# Patient Record
Sex: Male | Born: 1935 | Race: White | Hispanic: No | Marital: Married | Smoking: Former smoker
Health system: Southern US, Community
[De-identification: ages and names within clinical notes are randomized; demographics above are authoritative.]

## PROBLEM LIST (undated history)

## (undated) DIAGNOSIS — K209 Esophagitis, unspecified without bleeding: Secondary | ICD-10-CM

## (undated) DIAGNOSIS — M069 Rheumatoid arthritis, unspecified: Secondary | ICD-10-CM

## (undated) DIAGNOSIS — K5730 Diverticulosis of large intestine without perforation or abscess without bleeding: Secondary | ICD-10-CM

## (undated) DIAGNOSIS — K469 Unspecified abdominal hernia without obstruction or gangrene: Secondary | ICD-10-CM

## (undated) DIAGNOSIS — C9000 Multiple myeloma not having achieved remission: Secondary | ICD-10-CM

## (undated) DIAGNOSIS — K219 Gastro-esophageal reflux disease without esophagitis: Secondary | ICD-10-CM

## (undated) DIAGNOSIS — IMO0001 Reserved for inherently not codable concepts without codable children: Secondary | ICD-10-CM

## (undated) DIAGNOSIS — K2940 Chronic atrophic gastritis without bleeding: Secondary | ICD-10-CM

## (undated) DIAGNOSIS — Z860100 Personal history of colon polyps, unspecified: Secondary | ICD-10-CM

## (undated) DIAGNOSIS — S065X9A Traumatic subdural hemorrhage with loss of consciousness of unspecified duration, initial encounter: Secondary | ICD-10-CM

## (undated) HISTORY — DX: Gastro-esophageal reflux disease without esophagitis: K21.9

## (undated) HISTORY — DX: Personal history of colon polyps, unspecified: Z86.0100

## (undated) HISTORY — DX: Diverticulosis of large intestine without perforation or abscess without bleeding: K57.30

## (undated) HISTORY — DX: Esophagitis, unspecified: K20.9

## (undated) HISTORY — DX: Unspecified abdominal hernia without obstruction or gangrene: K46.9

## (undated) HISTORY — DX: Multiple myeloma not having achieved remission: C90.00

## (undated) HISTORY — DX: Reserved for inherently not codable concepts without codable children: IMO0001

## (undated) HISTORY — PX: ORIF HIP FRACTURE: SHX2125

## (undated) HISTORY — DX: Personal history of colonic polyps: Z86.010

## (undated) HISTORY — DX: Esophagitis, unspecified without bleeding: K20.90

## (undated) HISTORY — DX: Chronic atrophic gastritis without bleeding: K29.40

---

## 1990-03-28 HISTORY — PX: INGUINAL HERNIA REPAIR: SUR1180

## 2004-06-06 ENCOUNTER — Ambulatory Visit: Payer: Self-pay | Admitting: Gastroenterology

## 2004-06-20 ENCOUNTER — Ambulatory Visit: Payer: Self-pay | Admitting: Gastroenterology

## 2004-10-02 ENCOUNTER — Ambulatory Visit: Payer: Self-pay | Admitting: Gastroenterology

## 2008-11-20 ENCOUNTER — Encounter: Admission: RE | Admit: 2008-11-20 | Discharge: 2008-11-20 | Payer: Self-pay | Admitting: Orthopedic Surgery

## 2010-01-17 ENCOUNTER — Inpatient Hospital Stay (HOSPITAL_COMMUNITY): Admission: EM | Admit: 2010-01-17 | Discharge: 2010-01-23 | Payer: Self-pay | Admitting: Emergency Medicine

## 2010-02-21 ENCOUNTER — Encounter: Admission: RE | Admit: 2010-02-21 | Discharge: 2010-02-21 | Payer: Self-pay | Admitting: Orthopedic Surgery

## 2010-09-01 LAB — CBC
HCT: 22.3 % — ABNORMAL LOW (ref 39.0–52.0)
HCT: 33.4 % — ABNORMAL LOW (ref 39.0–52.0)
Hemoglobin: 10.8 g/dL — ABNORMAL LOW (ref 13.0–17.0)
Hemoglobin: 11 g/dL — ABNORMAL LOW (ref 13.0–17.0)
Hemoglobin: 11.3 g/dL — ABNORMAL LOW (ref 13.0–17.0)
Hemoglobin: 7.7 g/dL — ABNORMAL LOW (ref 13.0–17.0)
MCH: 31.7 pg (ref 26.0–34.0)
MCH: 32.5 pg (ref 26.0–34.0)
MCH: 32.6 pg (ref 26.0–34.0)
MCHC: 34.9 g/dL (ref 30.0–36.0)
MCV: 92.5 fL (ref 78.0–100.0)
MCV: 94.4 fL (ref 78.0–100.0)
Platelets: 143 10*3/uL — ABNORMAL LOW (ref 150–400)
RBC: 2.36 MIL/uL — ABNORMAL LOW (ref 4.22–5.81)
RBC: 3.31 MIL/uL — ABNORMAL LOW (ref 4.22–5.81)
RBC: 3.49 MIL/uL — ABNORMAL LOW (ref 4.22–5.81)
RBC: 3.51 MIL/uL — ABNORMAL LOW (ref 4.22–5.81)
RDW: 16.7 % — ABNORMAL HIGH (ref 11.5–15.5)
RDW: 18.1 % — ABNORMAL HIGH (ref 11.5–15.5)
WBC: 5.9 10*3/uL (ref 4.0–10.5)
WBC: 8.2 10*3/uL (ref 4.0–10.5)
WBC: 9.1 10*3/uL (ref 4.0–10.5)

## 2010-09-01 LAB — COMPREHENSIVE METABOLIC PANEL
ALT: 16 U/L (ref 0–53)
AST: 24 U/L (ref 0–37)
Alkaline Phosphatase: 55 U/L (ref 39–117)
Alkaline Phosphatase: 66 U/L (ref 39–117)
BUN: 21 mg/dL (ref 6–23)
Calcium: 8.8 mg/dL (ref 8.4–10.5)
Creatinine, Ser: 1.16 mg/dL (ref 0.4–1.5)
GFR calc Af Amer: >60 mL/min (ref >60)
Glucose, Bld: 201 mg/dL — ABNORMAL HIGH (ref 70–99)
Potassium: 3.9 mEq/L (ref 3.5–5.1)
Potassium: 4.8 mEq/L (ref 3.5–5.1)
Sodium: 136 mEq/L (ref 135–145)
Total Protein: 7.1 g/dL (ref 6.0–8.3)

## 2010-09-01 LAB — URINALYSIS, ROUTINE W REFLEX MICROSCOPIC
Glucose, UA: NEGATIVE mg/dL
Glucose, UA: NEGATIVE mg/dL
Ketones, ur: 15 mg/dL — AB
Leukocytes, UA: NEGATIVE
Nitrite: NEGATIVE
Specific Gravity, Urine: 1.003 — ABNORMAL LOW (ref 1.005–1.030)
Specific Gravity, Urine: 1.024 (ref 1.005–1.030)
Urobilinogen, UA: 0.2 mg/dL (ref 0.0–1.0)
pH: 6 (ref 5.0–8.0)

## 2010-09-01 LAB — DIFFERENTIAL
Basophils Absolute: 0 10*3/uL (ref 0.0–0.1)
Basophils Relative: 0 % (ref 0–1)
Blasts: 0 %
Eosinophils Absolute: 0 10*3/uL (ref 0.0–0.7)
Eosinophils Absolute: 0.1 10*3/uL (ref 0.0–0.7)
Eosinophils Relative: 0 % (ref 0–5)
Eosinophils Relative: 1 % (ref 0–5)
Lymphocytes Relative: 10 % — ABNORMAL LOW (ref 12–46)
Monocytes Relative: 2 % — ABNORMAL LOW (ref 3–12)
Monocytes Relative: 2 % — ABNORMAL LOW (ref 3–12)
Myelocytes: 0 %
Neutro Abs: 6.2 10*3/uL (ref 1.7–7.7)
Neutrophils Relative %: 88 % — ABNORMAL HIGH (ref 43–77)
nRBC: 0 /100 WBC

## 2010-09-01 LAB — TYPE AND SCREEN: Antibody Screen: NEGATIVE

## 2010-09-01 LAB — PROTIME-INR
INR: 1.24 (ref 0.00–1.49)
INR: 1.36 (ref 0.00–1.49)
Prothrombin Time: 16.6 seconds — ABNORMAL HIGH (ref 11.6–15.2)
Prothrombin Time: 17 seconds — ABNORMAL HIGH (ref 11.6–15.2)

## 2010-09-01 LAB — BASIC METABOLIC PANEL
BUN: 13 mg/dL (ref 6–23)
CO2: 25 mEq/L (ref 19–32)
CO2: 25 mEq/L (ref 19–32)
CO2: 28 mEq/L (ref 19–32)
Calcium: 7.7 mg/dL — ABNORMAL LOW (ref 8.4–10.5)
Calcium: 8.1 mg/dL — ABNORMAL LOW (ref 8.4–10.5)
Chloride: 104 mEq/L (ref 96–112)
Chloride: 105 mEq/L (ref 96–112)
Creatinine, Ser: 0.91 mg/dL (ref 0.4–1.5)
Creatinine, Ser: 0.93 mg/dL (ref 0.4–1.5)
GFR calc Af Amer: >60 mL/min (ref >60)
GFR calc non Af Amer: >60 mL/min (ref >60)
Glucose, Bld: 119 mg/dL — ABNORMAL HIGH (ref 70–99)
Potassium: 4.2 mEq/L (ref 3.5–5.1)
Potassium: 4.7 mEq/L (ref 3.5–5.1)
Sodium: 136 mEq/L (ref 135–145)
Sodium: 137 mEq/L (ref 135–145)

## 2010-09-01 LAB — URINE MICROSCOPIC-ADD ON

## 2010-09-01 LAB — URINE CULTURE
Colony Count: NO GROWTH
Culture: NO GROWTH
Special Requests: NEGATIVE

## 2011-05-23 ENCOUNTER — Encounter: Payer: Self-pay | Admitting: Gastroenterology

## 2011-05-29 ENCOUNTER — Encounter: Payer: Self-pay | Admitting: Gastroenterology

## 2011-06-26 ENCOUNTER — Ambulatory Visit (INDEPENDENT_AMBULATORY_CARE_PROVIDER_SITE_OTHER): Payer: MEDICARE | Admitting: Gastroenterology

## 2011-06-26 ENCOUNTER — Encounter: Payer: Self-pay | Admitting: Gastroenterology

## 2011-06-26 DIAGNOSIS — Z860100 Personal history of colon polyps, unspecified: Secondary | ICD-10-CM | POA: Insufficient documentation

## 2011-06-26 DIAGNOSIS — M069 Rheumatoid arthritis, unspecified: Secondary | ICD-10-CM | POA: Insufficient documentation

## 2011-06-26 DIAGNOSIS — K219 Gastro-esophageal reflux disease without esophagitis: Secondary | ICD-10-CM

## 2011-06-26 MED ORDER — PEG-KCL-NACL-NASULF-NA ASC-C 100 G PO SOLR: 1.0000 | Freq: Once | ORAL | Status: DC

## 2011-06-26 NOTE — Patient Instructions (Signed)
Your procedure has been scheduled for 07/02/2011, please follow the seperate instructions.  Your prescription(s) have been sent to you pharmacy.

## 2011-06-26 NOTE — Progress Notes (Signed)
History of Present Illness:  This is a extremely nice and pleasant 77 year old Caucasian male retiree. He has a history of a previous colon polyp removed 5 years ago, but the patient is not exactly sure where this was performed. In any case, it is recommended that he have followup exam in 5 years. He currently has no GI complaints, specifically denies melena, hematochezia, abdominal pain or history of liver or gallbladder disease. He has had chronic acid reflux for many years and has been on daily PPI therapy. He denies dysphagia or active reflux symptoms. His appetite is good and his weight is stable, and he denies any food intolerances. His sense of well being is excellent and he denies systemic complaints such as fever or chills. He does have chronic rheumatoid arthritis and is on weekly methotrexate per Dr. Cardell Peach was rheumatologist in [place], [place]. His primary care physician is Dr. Mertie Clause.   I have reviewed this patient's present history, medical and surgical past history, allergies and medications.     ROS: The remainder of the 10 point ROS is negative     Physical Exam: General well developed well nourished patient in no acute distress, appearing his stated age Eyes PERRLA, no icterus, fundoscopic exam per opthamologist Skin no lesions noted Neck supple, no adenopathy, no thyroid enlargement, no tenderness Chest clear to percussion and auscultation Heart no significant murmurs, gallops or rubs noted Abdomen no hepatosplenomegaly masses or tenderness, BS normal.  Extremities no acute joint lesions, edema, phlebitis or evidence of cellulitis. Neurologic patient oriented x 3, cranial nerves intact, no focal neurologic deficits noted. Psychological mental status normal and normal affect.  Assessment and plan: This patient has chronic GERD and is on daily PPI therapy. It is unclear if he ever has had endoscopic exam to screen for Barrett's mucosa. I have scheduled this  exam at the time of his followup colonoscopy per his history of colon polyps. His family history is noncontributory. Patient's rheumatoid arthritis seems to be doing well on methotrexate therapy. He has no history of any cardiovascular or other systemic illnesses. Blood pressure today is 122/72, pulse 68, and BMI is 21.5.

## 2011-07-02 ENCOUNTER — Other Ambulatory Visit: Payer: Self-pay | Admitting: Gastroenterology

## 2011-07-02 ENCOUNTER — Encounter: Payer: Self-pay | Admitting: Gastroenterology

## 2011-07-02 ENCOUNTER — Ambulatory Visit (AMBULATORY_SURGERY_CENTER): Payer: MEDICARE | Admitting: Gastroenterology

## 2011-07-02 VITALS — HR 80 | Temp 96.7°F | Resp 20 | Ht 70.0 in | Wt 149.0 lb

## 2011-07-02 DIAGNOSIS — K449 Diaphragmatic hernia without obstruction or gangrene: Secondary | ICD-10-CM | POA: Insufficient documentation

## 2011-07-02 DIAGNOSIS — B3781 Candidal esophagitis: Secondary | ICD-10-CM

## 2011-07-02 DIAGNOSIS — K219 Gastro-esophageal reflux disease without esophagitis: Secondary | ICD-10-CM

## 2011-07-02 DIAGNOSIS — K209 Esophagitis, unspecified: Secondary | ICD-10-CM

## 2011-07-02 DIAGNOSIS — Z1211 Encounter for screening for malignant neoplasm of colon: Secondary | ICD-10-CM

## 2011-07-02 DIAGNOSIS — Z86010 Personal history of colonic polyps: Secondary | ICD-10-CM

## 2011-07-02 DIAGNOSIS — K5710 Diverticulosis of small intestine without perforation or abscess without bleeding: Secondary | ICD-10-CM | POA: Insufficient documentation

## 2011-07-02 MED ORDER — FLUCONAZOLE 100 MG PO TABS: ORAL_TABLET | ORAL | Status: DC

## 2011-07-02 MED ORDER — SODIUM CHLORIDE 0.9 % IV SOLN: 500.0000 mL | INTRAVENOUS | Status: DC

## 2011-07-02 NOTE — Op Note (Addendum)
[facility] [address] [place], [place]  [address]  ENDOSCOPY PROCEDURE REPORT  PATIENT:  Shawn Chen, Shawn Chen  MR#:  [id number] BIRTHDATE:  1926-09-26, 84 yrs. old  GENDER:  male  ENDOSCOPIST:  Vania Rea. Jarold Motto, MD, [facility] Referred by:  PROCEDURE DATE:  07/02/2011 PROCEDURE:  EGD with biopsy, 95621 ASA CLASS:  Class II INDICATIONS:  GERD  MEDICATIONS:   There was residual sedation effect present from prior procedure., propofol (Diprivan) 80 mg IV TOPICAL ANESTHETIC:  DESCRIPTION OF PROCEDURE:   After the risks and benefits of the procedure were explained, informed consent was obtained.  The [facility] [id number] endoscope was introduced through the mouth and advanced to the second portion of the duodenum.  The instrument was slowly withdrawn as the mucosa was fully examined. <<PROCEDUREIMAGES>>  A diverticulum was found in the bulb and descending duodenum.  A hiatal hernia was found. LARGE HH NOTED.SEE PICTURES.  white plaques in the mid esophagus. DENSE CANDIDA PLAQUES.  Esophagitis was found. LONG LINEAR EROSIONS.DISTAL BIOPSIES DONE. Retroflexed views revealed a hiatal hernia.    The scope was then withdrawn from the patient and the procedure completed.  COMPLICATIONS:  None  ENDOSCOPIC IMPRESSION: 1) Diverticulum in the bulb/descending duodenum 2) Hiatal hernia 3) White plaques in the mid esophagus 4) Esophagitis 1.GERD.R/O BARRETT'S MUCOSA 2.CANDIDA ESOPHAGITIS 3.DUODENAL DIVERTICULUM RECOMMENDATIONS: 1.DIFLUCAN RX. 2.CONTINUE PPI 3.AWAIT BIOPSIES  ______________________________ Vania Rea. Jarold Motto, MD, [facility]  CC:  [name], MD  n. Rosalie Doctor:   Vania Rea. Gideon Burstein at 07/02/2011 10:16 AM  Joslyn Devon, [phone]

## 2011-07-02 NOTE — Patient Instructions (Addendum)
COLON-  MILD DIVERTICULOSIS  ENDO-  HIATAL HERNIA, ESOPHAGITIS, CANDIDA ESOPHAGUS  DIFLUCAN PRESCRIPTION  SENT TO PHARMACY  ACIPHEX SAMPLES GIVEN

## 2011-07-02 NOTE — Op Note (Signed)
[facility] [address] [place], [place]  [address]  COLONOSCOPY PROCEDURE REPORT  PATIENT:  Shawn Chen, Shawn Chen  MR#:  [id number] BIRTHDATE:  1927/01/09, 84 yrs. old  GENDER:  male ENDOSCOPIST:  Vania Rea. Jarold Motto, MD, [facility] REF. BY: PROCEDURE DATE:  07/02/2011 PROCEDURE:  Diagnostic Colonoscopy ASA CLASS:  Class II INDICATIONS:  history of polyps MEDICATIONS:   propofol (Diprivan) 100 mg IV  DESCRIPTION OF PROCEDURE:   After the risks and benefits and of the procedure were explained, informed consent was obtained. Digital rectal exam was performed and revealed no abnormalities. The [id number] endoscope was introduced through the anus and advanced to the cecum, which was identified by both the appendix and ileocecal valve.  The quality of the prep was excellent, using MoviPrep.  The instrument was then slowly withdrawn as the colon was fully examined. <<PROCEDUREIMAGES>>  FINDINGS:  No polyps or cancers were seen.  There were mild diverticular changes in left colon. diverticulosis was found. This was otherwise a normal examination of the colon.   Retroflexed views in the rectum revealed no abnormalities.    The scope was then withdrawn from the patient and the procedure completed.  COMPLICATIONS:  None ENDOSCOPIC IMPRESSION: 1) No polyps or cancers 2) Diverticulosis,mild,left sided diverticulosis 3) Otherwise normal examination RECOMMENDATIONS: 1) High fiber diet. PRN COLONOSCOPY F/U  REPEAT EXAM:  No  ______________________________ Vania Rea. Jarold Motto, MD, [facility]  CC:  [name], MD  n. Rosalie Doctor:   Vania Rea. Patterson at 07/02/2011 10:06 AM  Joslyn Devon, [phone]

## 2011-07-02 NOTE — Progress Notes (Signed)
Patient did not experience any of the following events: a burn prior to discharge; a fall within the facility; wrong site/side/patient/procedure/implant event; or a hospital transfer or hospital admission upon discharge from the facility. (G8907) Patient did not have preoperative order for IV antibiotic SSI prophylaxis. (G8918)  

## 2011-07-03 ENCOUNTER — Telehealth: Payer: Self-pay | Admitting: *Deleted

## 2011-07-03 NOTE — Telephone Encounter (Signed)

## 2011-07-19 ENCOUNTER — Encounter: Payer: Self-pay | Admitting: Internal Medicine

## 2011-07-25 ENCOUNTER — Encounter: Payer: Self-pay | Admitting: Gastroenterology

## 2011-07-25 ENCOUNTER — Ambulatory Visit (INDEPENDENT_AMBULATORY_CARE_PROVIDER_SITE_OTHER): Payer: Medicare Other | Admitting: Gastroenterology

## 2011-07-25 VITALS — BP 100/52 | HR 68 | Ht 70.0 in | Wt 151.1 lb

## 2011-07-25 DIAGNOSIS — K219 Gastro-esophageal reflux disease without esophagitis: Secondary | ICD-10-CM | POA: Insufficient documentation

## 2011-07-25 DIAGNOSIS — K5730 Diverticulosis of large intestine without perforation or abscess without bleeding: Secondary | ICD-10-CM

## 2011-07-25 DIAGNOSIS — Z86010 Personal history of colonic polyps: Secondary | ICD-10-CM

## 2011-07-25 MED ORDER — PANTOPRAZOLE SODIUM 20 MG PO TBEC: 20.0000 mg | DELAYED_RELEASE_TABLET | ORAL | Status: DC | PRN

## 2011-07-25 NOTE — Progress Notes (Signed)
History of Present Illness: This is a very pleasant 78 year old Caucasian male who recently had colonoscopy of which was unremarkable except for diverticulosis. He has a history of previous colon polyps. Endoscopy was performed because of chronic GERD, and there was no evidence of Barrett's mucosa on esophageal biopsies, but he did have evidence of Candida esophagitis that was treated with Diflucan therapy. He denies chest pain or dysphagia at this time. Colonoscopy did reveal left colon diverticulosis. He denies bowel or regularity, abdominal pain, or general medical issues at this time.    Current Medications, Allergies, Past Medical History, Past Surgical History, Family History and Social History were reviewed in [employer] record.   Assessment and plan: Continue high fiber diet as tolerated, reflux regime, and daily PPI therapy. We will see him on a when necessary basis as needed. He is to continue his other medications as per primary care as outlined and reviewed. No diagnosis found.

## 2011-07-25 NOTE — Patient Instructions (Signed)
Your Protonix has been sent to your pharmacy. cc: Buren Kos, MD

## 2011-11-05 ENCOUNTER — Other Ambulatory Visit: Payer: Self-pay | Admitting: Internal Medicine

## 2011-11-05 DIAGNOSIS — I714 Abdominal aortic aneurysm, without rupture: Secondary | ICD-10-CM

## 2011-11-09 ENCOUNTER — Ambulatory Visit
Admission: RE | Admit: 2011-11-09 | Discharge: 2011-11-09 | Disposition: A | Discharge status: Home / Self Care | Payer: Medicare Other | Source: Ambulatory Visit | Attending: Internal Medicine | Admitting: Internal Medicine

## 2011-11-09 DIAGNOSIS — I714 Abdominal aortic aneurysm, without rupture: Secondary | ICD-10-CM

## 2012-07-28 ENCOUNTER — Other Ambulatory Visit: Payer: Self-pay | Admitting: *Deleted

## 2012-07-28 MED ORDER — PANTOPRAZOLE SODIUM 20 MG PO TBEC: 20.0000 mg | DELAYED_RELEASE_TABLET | ORAL | Status: DC | PRN

## 2012-11-16 DIAGNOSIS — S065XAA Traumatic subdural hemorrhage with loss of consciousness status unknown, initial encounter: Secondary | ICD-10-CM

## 2012-11-16 DIAGNOSIS — S065X9A Traumatic subdural hemorrhage with loss of consciousness of unspecified duration, initial encounter: Secondary | ICD-10-CM

## 2012-11-16 HISTORY — DX: Traumatic subdural hemorrhage with loss of consciousness of unspecified duration, initial encounter: S06.5X9A

## 2012-11-16 HISTORY — DX: Traumatic subdural hemorrhage with loss of consciousness status unknown, initial encounter: S06.5XAA

## 2012-11-22 ENCOUNTER — Emergency Department (HOSPITAL_BASED_OUTPATIENT_CLINIC_OR_DEPARTMENT_OTHER)
Admission: EM | Admit: 2012-11-22 | Discharge: 2012-11-22 | Disposition: A | Discharge status: Home / Self Care | Payer: Medicare Other | Attending: Emergency Medicine | Admitting: Emergency Medicine

## 2012-11-22 ENCOUNTER — Emergency Department (HOSPITAL_BASED_OUTPATIENT_CLINIC_OR_DEPARTMENT_OTHER): Payer: Medicare Other

## 2012-11-22 ENCOUNTER — Encounter (HOSPITAL_BASED_OUTPATIENT_CLINIC_OR_DEPARTMENT_OTHER): Payer: Self-pay

## 2012-11-22 DIAGNOSIS — S0993XA Unspecified injury of face, initial encounter: Secondary | ICD-10-CM | POA: Insufficient documentation

## 2012-11-22 DIAGNOSIS — S0001XA Abrasion of scalp, initial encounter: Secondary | ICD-10-CM

## 2012-11-22 DIAGNOSIS — S0990XA Unspecified injury of head, initial encounter: Secondary | ICD-10-CM

## 2012-11-22 DIAGNOSIS — Z8739 Personal history of other diseases of the musculoskeletal system and connective tissue: Secondary | ICD-10-CM | POA: Insufficient documentation

## 2012-11-22 DIAGNOSIS — Z87891 Personal history of nicotine dependence: Secondary | ICD-10-CM | POA: Insufficient documentation

## 2012-11-22 DIAGNOSIS — Z8719 Personal history of other diseases of the digestive system: Secondary | ICD-10-CM | POA: Insufficient documentation

## 2012-11-22 DIAGNOSIS — Z860100 Personal history of colon polyps, unspecified: Secondary | ICD-10-CM | POA: Insufficient documentation

## 2012-11-22 DIAGNOSIS — W19XXXA Unspecified fall, initial encounter: Secondary | ICD-10-CM

## 2012-11-22 DIAGNOSIS — Z872 Personal history of diseases of the skin and subcutaneous tissue: Secondary | ICD-10-CM | POA: Insufficient documentation

## 2012-11-22 DIAGNOSIS — Y939 Activity, unspecified: Secondary | ICD-10-CM | POA: Insufficient documentation

## 2012-11-22 DIAGNOSIS — IMO0002 Reserved for concepts with insufficient information to code with codable children: Secondary | ICD-10-CM | POA: Insufficient documentation

## 2012-11-22 DIAGNOSIS — F29 Unspecified psychosis not due to a substance or known physiological condition: Secondary | ICD-10-CM | POA: Insufficient documentation

## 2012-11-22 DIAGNOSIS — Z79899 Other long term (current) drug therapy: Secondary | ICD-10-CM | POA: Insufficient documentation

## 2012-11-22 DIAGNOSIS — Y929 Unspecified place or not applicable: Secondary | ICD-10-CM | POA: Insufficient documentation

## 2012-11-22 DIAGNOSIS — W1809XA Striking against other object with subsequent fall, initial encounter: Secondary | ICD-10-CM | POA: Insufficient documentation

## 2012-11-22 DIAGNOSIS — K219 Gastro-esophageal reflux disease without esophagitis: Secondary | ICD-10-CM | POA: Insufficient documentation

## 2012-11-22 HISTORY — DX: Multiple myeloma not having achieved remission: C90.00

## 2012-11-22 HISTORY — DX: Rheumatoid arthritis, unspecified: M06.9

## 2012-11-22 NOTE — ED Notes (Signed)
Pt's wife states that the patient fell yesterday, falling backwards hitting his head on concrete.  EMS came to scene, told pt he didn't need to go to hospital.  Pt stated to wife this morning that he woke up, and head felt "fuzzy".  PERRLA, 3mm.  Grips strong equal bilaterally, hematoma to posterior upper head.

## 2012-11-22 NOTE — ED Provider Notes (Signed)
History     CSN: [id number]  Arrival date & time 11/22/12  1103   First MD Initiated Contact with Patient 11/22/12 1121      Chief Complaint  Patient presents with  . Fall  . Head Injury    (Consider location/radiation/quality/duration/timing/severity/associated sxs/prior treatment) Patient is a 79 y.o. male presenting with fall and head injury. The history is provided by the patient.  Fall Pertinent negatives include no chest pain, no abdominal pain and no shortness of breath.  Head Injury Associated symptoms: neck pain   Associated symptoms: no nausea and no vomiting    patient with fall yesterday fell and hit the back of his head. No loss of consciousness. But today 2 I seems to be a little confused and fuzzy. Patient did receive an abrasion to the back of his head no significant bleeding. No nausea no vomiting no other injuries. Did not hurt his hips or legs or arms. Not complaining of back pain. Patient not complaining of any pain currently other than some mild neck pain which he says about a 2/10.  Past Medical History  Diagnosis Date  . Diverticulosis of colon (without mention of hemorrhage)   . Esophagitis, unspecified   . 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 Personal history of colonic polyps   . Reflux   . Hernia   . RA (rheumatoid arthritis)   . Myeloma     possiblity of this after last physical, to follow up with hematology    Past Surgical History  Procedure Laterality Date  . Inguinal hernia repair  03/28/1990    dr price  . Orif hip fracture      Family History  Problem Relation Age of Onset  . Colon cancer Neg Hx     History  Substance Use Topics  . Smoking status: Former Smoker    Quit date: 06/18/1990  . Smokeless tobacco: Never Used  . Alcohol Use: No      Review of Systems  Constitutional: Negative for fever.  HENT: Positive for neck pain.   Respiratory: Negative for shortness of breath.   Cardiovascular: Negative for  chest pain.  Gastrointestinal: Negative for nausea, vomiting and abdominal pain.  Musculoskeletal: Negative for back pain.  Skin: Negative for rash.    Allergies  Review of patient's allergies indicates no known allergies.  Home Medications   Current Outpatient Rx  Name  Route  Sig  Dispense  Refill  . fluconazole (DIFLUCAN) 100 MG tablet      Take two tablets by mouth on day one and one tablet by mouth for two weeks.   15 tablet   0   . methotrexate 2.5 MG tablet   Oral   Take 2.5 mg by mouth once a week.           . metoprolol (TOPROL-XL) 50 MG 24 hr tablet   Oral   Take 50 mg by mouth daily. Take 1/2 of a tablet by mouth every day          . pantoprazole (PROTONIX) 20 MG tablet   Oral   Take 1 tablet (20 mg total) by mouth as needed.   30 tablet   9   . simvastatin (ZOCOR) 40 MG tablet   Oral   Take 40 mg by mouth daily.             BP 112/64  Pulse 68  Temp(Src) 98 F (36.7 C) (Oral)  Resp 18  Ht 5'  4" (1.626 m)  Wt 140 lb (63.504 kg)  BMI 24.02 kg/m2  SpO2 100%  Physical Exam  Constitutional: He is oriented to person, place, and time. He appears well-developed and well-nourished. No distress.  HENT:  Mouth/Throat: Oropharynx is clear and moist.  An abrasion to the back of the head measuring about 3 cm. No active bleeding no signs of infection. No step off.  Eyes: Conjunctivae and EOM are normal. Pupils are equal, round, and reactive to light.  Neck: Normal range of motion. Neck supple.  Cardiovascular: Normal rate, regular rhythm and normal heart sounds.   No murmur heard. Pulmonary/Chest: Effort normal and breath sounds normal.  Abdominal: Soft. Bowel sounds are normal. There is no tenderness.  Musculoskeletal: Normal range of motion.  Neurological: He is alert and oriented to person, place, and time. No cranial nerve deficit. He exhibits normal muscle tone. Coordination normal.  Skin: Skin is warm. No rash noted.    ED Course  Procedures  (including critical care time)  Labs Reviewed - No data to display Ct Head Wo Contrast  11/22/2012   *RADIOLOGY REPORT*  Clinical Data:  History of fall with injury to the head.  Head and neck pain.  CT HEAD WITHOUT CONTRAST CT CERVICAL SPINE WITHOUT CONTRAST  Technique:  Multidetector CT imaging of the head and cervical spine was performed following the standard protocol without intravenous contrast.  Multiplanar CT image reconstructions of the cervical spine were also generated.  Comparison:  No priors.  CT HEAD  Findings: Mild cerebral and cerebellar atrophy.  Extensive patchy and confluent areas of decreased attenuation throughout the deep and periventricular white matter of the cerebral hemispheres bilaterally, compatible with advanced chronic microvascular ischemic disease.  Physiologic calcifications in the basal ganglia bilaterally. No acute displaced skull fractures are identified.  No acute intracranial abnormality.  Specifically, no evidence of acute post-traumatic intracranial hemorrhage, no definite regions of acute/subacute cerebral ischemia, no focal mass, mass effect, hydrocephalus or abnormal intra or extra-axial fluid collections. The visualized paranasal sinuses are generally well pneumatized, with exception of some mild multifocal echoes with thickening throughout the ethmoid and maxillary sinuses bilaterally. Bilateral mastoid effusions with some sclerosis of several mastoid air cells.  IMPRESSION: 1.  No acute displaced skull fractures or acute intracranial abnormalities. 2.  Small bilateral mastoid effusions.  Whether or not these are acute or chronic is uncertain ( although, given the sclerosis in several mastoid cells these are favored to be chronic). 3.  Cerebral and cerebellar atrophy with extensive chronic microvascular ischemic disease throughout the deep and periventricular white matter of the cerebral hemispheres bilaterally.  CT CERVICAL SPINE  Findings: No acute displaced  fractures of the cervical spine. Exaggeration of the normal cervical lordosis, which is favored to be either positional or degenerative.  There is multilevel degenerative disc disease, most severe at C4-C5, C5-C6 and C6-C7. Mild multilevel facet arthropathy is noted. 2 mm of retrolisthesis of C5 upon C6.  Prevertebral soft tissues are normal.  Visualized portions of the upper thorax are unremarkable.  IMPRESSION:  1.  No evidence of significant acute traumatic injury to the cervical spine. 2.  Multilevel degenerative disc disease and cervical spondylosis, as above.   Original Report Authenticated By: Trudie Reed, M.D.   Ct Cervical Spine Wo Contrast  11/22/2012   *RADIOLOGY REPORT*  Clinical Data:  History of fall with injury to the head.  Head and neck pain.  CT HEAD WITHOUT CONTRAST CT CERVICAL SPINE WITHOUT CONTRAST  Technique:  Multidetector  CT imaging of the head and cervical spine was performed following the standard protocol without intravenous contrast.  Multiplanar CT image reconstructions of the cervical spine were also generated.  Comparison:  No priors.  CT HEAD  Findings: Mild cerebral and cerebellar atrophy.  Extensive patchy and confluent areas of decreased attenuation throughout the deep and periventricular white matter of the cerebral hemispheres bilaterally, compatible with advanced chronic microvascular ischemic disease.  Physiologic calcifications in the basal ganglia bilaterally. No acute displaced skull fractures are identified.  No acute intracranial abnormality.  Specifically, no evidence of acute post-traumatic intracranial hemorrhage, no definite regions of acute/subacute cerebral ischemia, no focal mass, mass effect, hydrocephalus or abnormal intra or extra-axial fluid collections. The visualized paranasal sinuses are generally well pneumatized, with exception of some mild multifocal echoes with thickening throughout the ethmoid and maxillary sinuses bilaterally. Bilateral mastoid  effusions with some sclerosis of several mastoid air cells.  IMPRESSION: 1.  No acute displaced skull fractures or acute intracranial abnormalities. 2.  Small bilateral mastoid effusions.  Whether or not these are acute or chronic is uncertain ( although, given the sclerosis in several mastoid cells these are favored to be chronic). 3.  Cerebral and cerebellar atrophy with extensive chronic microvascular ischemic disease throughout the deep and periventricular white matter of the cerebral hemispheres bilaterally.  CT CERVICAL SPINE  Findings: No acute displaced fractures of the cervical spine. Exaggeration of the normal cervical lordosis, which is favored to be either positional or degenerative.  There is multilevel degenerative disc disease, most severe at C4-C5, C5-C6 and C6-C7. Mild multilevel facet arthropathy is noted. 2 mm of retrolisthesis of C5 upon C6.  Prevertebral soft tissues are normal.  Visualized portions of the upper thorax are unremarkable.  IMPRESSION:  1.  No evidence of significant acute traumatic injury to the cervical spine. 2.  Multilevel degenerative disc disease and cervical spondylosis, as above.   Original Report Authenticated By: Trudie Reed, M.D.     1. Fall, initial encounter   2. Head injury, initial encounter   3. Scalp abrasion, initial encounter       MDM  Status post fall yesterday fell backwards striking the back of his head. Has an abrasion there measuring about 3 cm no signs of infection. CT of head and neck without any acute injury or abnormalities.        Shelda Jakes, MD 11/22/12 [phone]

## 2012-11-24 ENCOUNTER — Telehealth: Payer: Self-pay | Admitting: Hematology & Oncology

## 2012-11-24 ENCOUNTER — Ambulatory Visit
Admission: RE | Admit: 2012-11-24 | Discharge: 2012-11-24 | Disposition: A | Discharge status: Home / Self Care | Payer: Medicare Other | Source: Ambulatory Visit | Attending: Internal Medicine | Admitting: Internal Medicine

## 2012-11-24 ENCOUNTER — Other Ambulatory Visit: Payer: Self-pay | Admitting: Internal Medicine

## 2012-11-24 DIAGNOSIS — D472 Monoclonal gammopathy: Secondary | ICD-10-CM

## 2012-11-24 DIAGNOSIS — D649 Anemia, unspecified: Secondary | ICD-10-CM

## 2012-11-24 NOTE — Telephone Encounter (Signed)
Pt aware of 7-9 appointment °

## 2012-11-26 ENCOUNTER — Telehealth: Payer: Self-pay | Admitting: Hematology & Oncology

## 2012-11-26 NOTE — Telephone Encounter (Signed)
Daughter called  Wanted to change 7-9 to 7-11 I called pt and he said that was fine. He is aware of new 7-11 appointment and time.

## 2012-11-27 ENCOUNTER — Telehealth: Payer: Self-pay | Admitting: Hematology & Oncology

## 2012-11-27 NOTE — Telephone Encounter (Signed)
Pt aware moved 7-11 to 7-1

## 2012-12-16 ENCOUNTER — Ambulatory Visit: Payer: Medicare Other

## 2012-12-16 ENCOUNTER — Ambulatory Visit (HOSPITAL_BASED_OUTPATIENT_CLINIC_OR_DEPARTMENT_OTHER): Payer: Medicare Other | Admitting: Hematology & Oncology

## 2012-12-16 ENCOUNTER — Ambulatory Visit (HOSPITAL_BASED_OUTPATIENT_CLINIC_OR_DEPARTMENT_OTHER): Payer: Medicare Other | Admitting: Lab

## 2012-12-16 ENCOUNTER — Telehealth: Payer: Self-pay | Admitting: Hematology & Oncology

## 2012-12-16 VITALS — BP 98/48 | HR 59 | Temp 97.4°F | Resp 18 | Ht 64.0 in | Wt 144.0 lb

## 2012-12-16 DIAGNOSIS — D472 Monoclonal gammopathy: Secondary | ICD-10-CM

## 2012-12-16 LAB — CBC WITH DIFFERENTIAL (CANCER CENTER ONLY)
BASO#: 0 10*3/uL (ref 0.0–0.2)
BASO%: 0.5 % (ref 0.0–2.0)
EOS%: 1.3 % (ref 0.0–7.0)
HCT: 29.5 % — ABNORMAL LOW (ref 38.7–49.9)
HGB: 10 g/dL — ABNORMAL LOW (ref 13.0–17.1)
LYMPH%: 23.7 % (ref 14.0–48.0)
MCHC: 33.9 g/dL (ref 32.0–35.9)
MCV: 99 fL — ABNORMAL HIGH (ref 82–98)
NEUT%: 70.6 % (ref 40.0–80.0)
RDW: 15.8 % — ABNORMAL HIGH (ref 11.1–15.7)

## 2012-12-16 NOTE — Telephone Encounter (Signed)
Patient was called and given 12/23/12 apt date/time and instructions.

## 2012-12-16 NOTE — Progress Notes (Signed)
This office note has been dictated.

## 2012-12-17 NOTE — Progress Notes (Signed)
CC:   [name], M.D.  DIAGNOSIS: 1. Monoclonal gammopathy, IgG lambda. 2. Anemia/leukopenia.  HISTORY PRESENT ILLNESS:  Mr. Shawn Chen is a very nice 79 year old white gentleman.  He is originally from [place].  He did serve our country.  He was in the [place] War conflict but was never stationed overseas.  He was in the [employer].  He is followed by Dr. [name].  He has multiple medical issues.  He has been longstanding rheumatoid arthritis.  He stated he take methotrexate every weekend.  He has been followed with routine lab studies.  He apparently had some lab work done recently.  I think this must have shown some anemia. Unfortunately, I do not have any CBCs in his record.  He then had a serum protein electrophoresis done.  Not surprisingly, this showed a monoclonal spike.  The M spike was 2 g/dL.  Immunofixation showed this to be an IgG lambda spike.  He had a random urine test done.  The urine electrophoresis also showed an IgG lambda spike.  He had a B12 level.  This was normal.  ferritin was okay.  I do not have any electrolytes on him.  His PSA was 1.35.  TSH was 1.86.  He did have a bone survey done.  The bone survey was done on 11/24/2012. The bone survey did not show any evidence of myelomatous bones.  He had a CT of the head and neck done.  This was because of a fall. This again did not show any obvious myelomatous lesions.  He had degenerative changes in his spine.  He had some chronic changes in his brain.  Because of the monoclonal spike, it was felt that a hematologic evaluation was indicated.  As such, he was sent to the [facility] for an evaluation.  He is doing okay.  The rheumatoid arthritis seems to be his worst problem.  He does have a hard time getting around.  He has had no problem with the pneumonias.  He has had no obvious weight loss.  He has had a decent appetite.  There has been no change in bowel or bladder habits.  He  has had no cough.  PAST MEDICAL HISTORY:  Remarkable for: 1. Rheumatoid arthritis. 2. Hypertension. 3. Hyperlipidemia. 4. Osteoporosis. 5. Depression. 6. History of pulmonary nodule.  ALLERGIES:  None.  MEDICATIONS:  Iron sulfate 1 p.o. daily, methotrexate 15 mg p.o. weekly, Toprol-XL __________ mg p.o. daily, Protonix 20 mg p.o. daily, Zocor 40 mg p.o. daily.  SOCIAL HISTORY:  Remarkable for some tobacco use.  He has probably not smoked for 40 years.  He may have had a 10 pack year history of tobacco use.  There is no significant alcohol use.  He has no obvious occupational exposures.  FAMILY HISTORY:  Noncontributory.  REVIEW OF SYSTEMS:  As stated in history of present illness.  No additional findings are noted on 12-system review.  PHYSICAL EXAMINATION:  General:  This is an elderly white gentleman in no obvious distress.  Vital signs:  Temperature of 97.4, pulse 59, respiratory rate 18, blood pressure 98/48.  Weight is 144.  Head and neck:  Normocephalic, atraumatic skull.  There are no ocular or oral lesions.  There are no palpable cervical or supraclavicular lymph nodes. Lungs:  Clear to percussion and auscultation bilaterally.  Cardiac: Regular rate and rhythm with a normal S1 and S2.  There are no murmurs, rubs or bruits.  Abdomen:  Soft with good bowel  sounds.  There is no palpable abdominal mass.  There is no fluid wave.  There is no palpable hepatosplenomegaly.  Extremities show arthritic changes in his joints. He has decent strength in his arms and legs.  He has decent pulses in his distal extremities.  Neurologic:  Shows no focal neurological deficits.  LABORATORY STUDIES:  White cell count is 3.9, hemoglobin 10, hematocrit 39.5, platelet count 171.  BUN is 27, creatinine 1.21.  Total protein 7.3 with an albumin of 3.6. Calcium is 8.8.  IMPRESSION:  Mr. Dozal is a very nice 79 year old gentleman with a monoclonal spike.  This is an IgG lambda  spike.  I would have to believe that this is going to be a monoclonal gammopathy of undetermined significance.  There are no bone lesions.  He has no hypercalcemia.  He is anemic with some mild leukopenia, but I suspect that this is probably from his methotrexate.  I did look at his blood smear.  I did not see any plasma cells.  There was no rouleaux formation.  He had no nucleated red cells.  White cells were not hypersegmented.  There were no immature myeloid cells. Platelets were adequate in number and size.  Unfortunately, I think we are going to have to do a bone marrow biopsy on him.  Because of his history of rheumatoid arthritis, he is at risk for myeloma.  He is also at risk for a lymphoproliferative disorder given that he has been on methotrexate.  I do not see that we need to do any scans on him.  I think a bone marrow biopsy would be appropriate.  I spoke to him and his family.  They are all very, very nice.  We will set up the bone marrow biopsy for July 8th.  I think the bone marrow biopsy will really dictate how we need to follow up Mr. Kisling.  If we just find that this is an MGUS, we could probably just follow him along every 4-6 months.  Again, I suspect that the anemia and thrombocytopenia are going to be because of the methotrexate.  I would not stop the methotrexate, however, as he seems to be doing fairly well with this.  I spent a good hour or so with Mr. Lando and his family.  Again, they are all very, very nice.  I will plan for followup depending on what we find with our bone marrow biopsy.    ______________________________ Josph Macho, M.D. PRE/MEDQ  D:  12/16/2012  T:  12/17/2012  Job:  [id number]

## 2012-12-18 LAB — KAPPA/LAMBDA LIGHT CHAINS
Kappa free light chain: 2.1 mg/dL — ABNORMAL HIGH (ref 0.33–1.94)
Lambda Free Lght Chn: 14.4 mg/dL — ABNORMAL HIGH (ref 0.57–2.63)

## 2012-12-18 LAB — COMPREHENSIVE METABOLIC PANEL
ALT: 11 U/L (ref 0–53)
AST: 14 U/L (ref 0–37)
BUN: 27 mg/dL — ABNORMAL HIGH (ref 6–23)
CO2: 24 mEq/L (ref 19–32)
Calcium: 8.8 mg/dL (ref 8.4–10.5)
Creatinine, Ser: 1.21 mg/dL (ref 0.50–1.35)
Total Bilirubin: 0.8 mg/dL (ref 0.3–1.2)

## 2012-12-18 LAB — PROTEIN ELECTROPHORESIS, SERUM, WITH REFLEX
Albumin ELP: 46.4 % — ABNORMAL LOW (ref 55.8–66.1)
Beta 2: 27.9 % — ABNORMAL HIGH (ref 3.2–6.5)
Beta Globulin: 5.1 % (ref 4.7–7.2)
M-Spike, %: 1.89 g/dL

## 2012-12-18 LAB — IFE INTERPRETATION

## 2012-12-18 LAB — IGG, IGA, IGM
IgG (Immunoglobin G), Serum: 2310 mg/dL — ABNORMAL HIGH (ref 650–1600)
IgM, Serum: 134 mg/dL (ref 41–251)

## 2012-12-23 ENCOUNTER — Other Ambulatory Visit (HOSPITAL_BASED_OUTPATIENT_CLINIC_OR_DEPARTMENT_OTHER): Payer: Medicare Other | Admitting: Lab

## 2012-12-23 ENCOUNTER — Other Ambulatory Visit (HOSPITAL_COMMUNITY)
Admission: RE | Admit: 2012-12-23 | Discharge: 2012-12-23 | Disposition: A | Discharge status: Home / Self Care | Payer: Medicare Other | Source: Ambulatory Visit | Attending: Hematology & Oncology | Admitting: Hematology & Oncology

## 2012-12-23 ENCOUNTER — Ambulatory Visit (HOSPITAL_BASED_OUTPATIENT_CLINIC_OR_DEPARTMENT_OTHER): Payer: Medicare Other | Admitting: Hematology & Oncology

## 2012-12-23 VITALS — BP 111/57 | HR 57 | Temp 97.4°F | Resp 18 | Ht 64.0 in | Wt 145.0 lb

## 2012-12-23 DIAGNOSIS — D539 Nutritional anemia, unspecified: Secondary | ICD-10-CM | POA: Insufficient documentation

## 2012-12-23 DIAGNOSIS — D472 Monoclonal gammopathy: Secondary | ICD-10-CM

## 2012-12-23 DIAGNOSIS — D649 Anemia, unspecified: Secondary | ICD-10-CM

## 2012-12-23 LAB — CBC WITH DIFFERENTIAL (CANCER CENTER ONLY)
BASO#: 0 10*3/uL (ref 0.0–0.2)
BASO%: 0.2 % (ref 0.0–2.0)
EOS%: 2.1 % (ref 0.0–7.0)
HCT: 30.1 % — ABNORMAL LOW (ref 38.7–49.9)
HGB: 10.1 g/dL — ABNORMAL LOW (ref 13.0–17.1)
LYMPH#: 1.4 10*3/uL (ref 0.9–3.3)
LYMPH%: 33.3 % (ref 14.0–48.0)
MCH: 33.6 pg — ABNORMAL HIGH (ref 28.0–33.4)
MCHC: 33.6 g/dL (ref 32.0–35.9)
MCV: 100 fL — ABNORMAL HIGH (ref 82–98)
MONO%: 3.1 % (ref 0.0–13.0)
NEUT%: 61.3 % (ref 40.0–80.0)
RDW: 16 % — ABNORMAL HIGH (ref 11.1–15.7)

## 2012-12-23 NOTE — Progress Notes (Signed)
This is a bone marrow procedure note for Shawn Chen. Shawn Chen. This is done because of anemia and a monoclonal spike.  Is brought to the treatment room at the [facility]. The appropriate timeout procedure was performed.  Is placed under his right side. The left posterior crest region was prepped and draped in sterile fashion. 10 cc of 2% lidocaine was okay under the skin down to the periosteum.  A #11 scalpel was used to make an incision into the skin. 2 bone marrow aspirates were obtained without difficulty. One aspirate was sent off her for flow cytometry and cytogenetics.  We then used a Jamshidi biopsy needle. His marrow cavity was somewhat soft. We did obtain a good bone marrow biopsy core.  We cleaned and dressed the procedure site sterilely. The patient tolerated the procedure well. There were no complications.   Pete E.

## 2012-12-23 NOTE — Patient Instructions (Signed)
[facility] Discharge Instructions for Post Bone Marrow Procedure  Today you had a bone marrow biopsy and aspirate of right hip.  Please keep the pressure dressing in place for at least 24 hours.  Have someone check your dressing periodically for bleeding.  If needed you can reapply a pressure dressing to the site.  Take pain medication as directed.  IF BLEEDING REOCCURS THAT SHOULD BE REPORTED IMMEDIATELY. Call the Cancer Center at [phone] if during business hours. Or report to the Emergency Room.   I have been informed and understand all the instructions given to me. I know to contact the clinic, my physician, or go to the Emergency Department if any problems should occur. I do not have any questions at this time, but understand that I may call the clinic during office hours at [phone]  should I have any questions or need assistance in obtaining follow up care.    __________________________________________  _____________  __________ Signature of Patient or Authorized Representative            Date                   Time    __________________________________________ Nurse's Signature     

## 2012-12-24 ENCOUNTER — Ambulatory Visit: Payer: Medicare Other | Admitting: Hematology & Oncology

## 2012-12-24 ENCOUNTER — Other Ambulatory Visit: Payer: Medicare Other | Admitting: Lab

## 2012-12-24 ENCOUNTER — Encounter: Payer: Self-pay | Admitting: Hematology & Oncology

## 2012-12-24 ENCOUNTER — Ambulatory Visit: Payer: Medicare Other

## 2012-12-24 NOTE — Addendum Note (Signed)
Addended by: Cathi Roan on: 12/24/2012 10:26 AM   Modules accepted: Medications

## 2012-12-24 NOTE — Progress Notes (Signed)
Patient presented with his wife for biopsy procedure. Consent form explained to patient, he had no questions and he signed consent form. Patient draped and positioned for procedure by Dr. Myna Hidalgo. Patient stated the biopsy " was not as bad as he thought it would be." Nourishment given to patient, his wife returned to room with patient while he rested post procedure. Discharge instructions given to patient and his wife. Patient was discharged home with instructed to call us if he had any problems. Vitals sign's taken pre and post procedure. Lupita Raider LPN.

## 2012-12-26 ENCOUNTER — Encounter: Payer: Medicare Other | Admitting: Hematology & Oncology

## 2012-12-26 ENCOUNTER — Ambulatory Visit: Payer: Medicare Other

## 2012-12-26 ENCOUNTER — Other Ambulatory Visit: Payer: Medicare Other | Admitting: Lab

## 2012-12-26 LAB — TISSUE HYBRIDIZATION (BONE MARROW)-NCBH

## 2012-12-29 ENCOUNTER — Telehealth: Payer: Self-pay | Admitting: Hematology & Oncology

## 2012-12-29 NOTE — Telephone Encounter (Signed)
Pt aware of 7-21 appointment °

## 2013-01-05 ENCOUNTER — Encounter: Payer: Self-pay | Admitting: Hematology & Oncology

## 2013-01-05 ENCOUNTER — Ambulatory Visit (HOSPITAL_BASED_OUTPATIENT_CLINIC_OR_DEPARTMENT_OTHER): Payer: Medicare Other | Admitting: Hematology & Oncology

## 2013-01-05 ENCOUNTER — Ambulatory Visit (HOSPITAL_BASED_OUTPATIENT_CLINIC_OR_DEPARTMENT_OTHER): Payer: Medicare Other | Admitting: Lab

## 2013-01-05 VITALS — BP 96/57 | HR 54 | Temp 97.7°F | Resp 18 | Ht 65.0 in | Wt 145.0 lb

## 2013-01-05 DIAGNOSIS — D461 Refractory anemia with ring sideroblasts: Secondary | ICD-10-CM

## 2013-01-05 DIAGNOSIS — C9000 Multiple myeloma not having achieved remission: Secondary | ICD-10-CM | POA: Insufficient documentation

## 2013-01-05 DIAGNOSIS — D4620 Refractory anemia with excess of blasts, unspecified: Secondary | ICD-10-CM

## 2013-01-05 HISTORY — DX: Multiple myeloma not having achieved remission: C90.00

## 2013-01-05 MED ORDER — LENALIDOMIDE 20 MG PO CAPS: 20.0000 mg | ORAL_CAPSULE | Freq: Every day | ORAL | Status: DC

## 2013-01-05 MED ORDER — FAMCICLOVIR 250 MG PO TABS: 250.0000 mg | ORAL_TABLET | Freq: Every day | ORAL | Status: DC

## 2013-01-05 MED ORDER — PROCHLORPERAZINE MALEATE 5 MG PO TABS: 5.0000 mg | ORAL_TABLET | Freq: Four times a day (QID) | ORAL | Status: DC | PRN

## 2013-01-05 NOTE — Progress Notes (Signed)
This office note has been dictated.

## 2013-01-05 NOTE — Patient Instructions (Addendum)
Lenalidomide Oral Capsules What is this medicine? LENALIDOMIDE (len a LID oh mide) is used to treat anemia from a myelodysplastic syndrome. It is also used to treat multiple myeloma. The medicine may help you to need blood transfusions less often. This medicine may be used for other purposes; ask your health care provider or pharmacist if you have questions. What should I tell my health care provider before I take this medicine? They need to know if you have any of these conditions: -blood clots in the legs or the lungs -infection -irregular monthly periods or menstrual cycles -kidney disease -an unusual or allergic reaction to lenalidomide, other medicines, foods, dyes, or preservatives -pregnant or trying to get pregnant -breast-feeding How should I use this medicine? Take this medicine by mouth with a glass of water. Follow the directions on the prescription label. Do not cut, crush, or chew this medicine. Take your medicine at regular intervals. Do not take it more often than directed. Do not stop taking except on your doctor's advice. A MedGuide will be given with each prescription and refill. Read this guide carefully each time. The MedGuide may change frequently. Talk to your pediatrician regarding the use of this medicine in children. Special care may be needed. Overdosage: If you think you have taken too much of this medicine contact a poison control center or emergency room at once. NOTE: This medicine is only for you. Do not share this medicine with others. What if I miss a dose? If you miss a dose, take it as soon as you can. If your next dose is to be taken in less than 12 hours, then do not take the missed dose. Take the next dose at your regular time. Do not take double or extra doses. What may interact with this medicine? -vaccines This list may not describe all possible interactions. Give your health care provider a list of all the medicines, herbs, non-prescription drugs, or  dietary supplements you use. Also tell them if you smoke, drink alcohol, or use illegal drugs. Some items may interact with your medicine. What should I watch for while using this medicine? Visit your doctor for regular check ups. Tell your doctor or healthcare professional if your symptoms do not start to get better or if they get worse. You will need to have important blood work done while you are taking this medicine. This medicine is available only through a special program. Doctors, pharmacies, and patients must meet all of the conditions of the program. Your health care provider will help you get signed up with the program if you need this medicine. Through the program you will only receive up to a 28 day supply of the medicine at one time. You will need a new prescription for each refill. This medicine can cause birth defects. Do not get pregnant while taking this drug. Females with child-bearing potential will need to have 2 negative pregnancy tests before starting this medicine. Pregnancy testing must be done every 2 to 4 weeks as directed while taking this medicine. Use 2 reliable forms of birth control together while you are taking this medicine and for 1 month after you stop taking this medicine. If you think that you might be pregnant talk to your doctor right away. Men must use a latex condom during sexual contact with a woman while taking this medicine and for 28 days after you stop taking this medicine. A latex condom is needed even if you have had a vasectomy. Contact your doctor  right away if your partner becomes pregnant. Do not donate sperm while taking this medicine and for 28 days after you stop taking this medicine. Do not give blood while taking the medicine and for 1 month after completion of treatment to avoid exposing pregnant women to the medicine through the donated blood. Talk to your doctor about your risk of cancer. You may be more at risk for certain types of cancers if you  take this medicine. You may need blood work done while you are taking this medicine. What side effects may I notice from receiving this medicine? Side effects that you should report to your doctor or health care professional as soon as possible: -allergic reactions like skin rash, itching or hives, swelling of the face, lips, or tongue -bloody or dark, tarry stools -breathing problems -chest pain -dark urine -fever, infection, runny nose, or sore throat -pain in the legs -swelling of your hands, ankles or leg -unusual bleeding or bruising Side effects that usually do not require medical attention (report to your doctor or health care professional if they continue or are bothersome): -diarrhea -tiredness This list may not describe all possible side effects. Call your doctor for medical advice about side effects. You may report side effects to FDA at 1-800-FDA-1088. Where should I keep my medicine? Keep out of the reach of children. Store at room temperature between 15 and 30 degrees C (59 and 86 degrees F). Throw away any unused medicine after the expiration date. NOTE: This sheet is a summary. It may not cover all possible information. If you have questions about this medicine, talk to your doctor, pharmacist, or health care provider.  2013, [employer] Standard. (08/02/2011 1:28:34 PM)    Bortezomib injection What is this medicine? BORTEZOMIB (bor TEZ oh mib) is a chemotherapy drug. It slows the growth of cancer cells. This medicine is used to treat multiple myeloma, lymphoma, and other cancers. This medicine may be used for other purposes; ask your health care provider or pharmacist if you have questions. What should I tell my health care provider before I take this medicine? They need to know if you have any of these conditions: -heart disease -irregular heartbeat -liver disease -low blood counts, like low white blood cells, platelets, or hemoglobin -peripheral  neuropathy -taking medicine for blood pressure -an unusual or allergic reaction to bortezomib, mannitol, boron, other medicines, foods, dyes, or preservatives -pregnant or trying to get pregnant -breast-feeding How should I use this medicine? This medicine is for injection into a vein or for injection under the skin. It is given by a health care professional in a hospital or clinic setting. Talk to your pediatrician regarding the use of this medicine in children. Special care may be needed. Overdosage: If you think you have taken too much of this medicine contact a poison control center or emergency room at once. NOTE: This medicine is only for you. Do not share this medicine with others. What if I miss a dose? It is important not to miss your dose. Call your doctor or health care professional if you are unable to keep an appointment. What may interact with this medicine? -medicines for diabetes -medicines to increase blood counts like filgrastim, pegfilgrastim, sargramostim -zalcitabine Talk to your doctor or health care professional before taking any of these medicines: -acetaminophen -aspirin -ibuprofen -ketoprofen -naproxen This list may not describe all possible interactions. Give your health care provider a list of all the medicines, herbs, non-prescription drugs, or dietary supplements you use. Also  tell them if you smoke, drink alcohol, or use illegal drugs. Some items may interact with your medicine. What should I watch for while using this medicine? Visit your doctor for checks on your progress. This drug may make you feel generally unwell. This is not uncommon, as chemotherapy can affect healthy cells as well as cancer cells. Report any side effects. Continue your course of treatment even though you feel ill unless your doctor tells you to stop. You may get drowsy or dizzy. Do not drive, use machinery, or do anything that needs mental alertness until you know how this medicine  affects you. Do not stand or sit up quickly, especially if you are an older patient. This reduces the risk of dizzy or fainting spells. In some cases, you may be given additional medicines to help with side effects. Follow all directions for their use. Call your doctor or health care professional for advice if you get a fever, chills or sore throat, or other symptoms of a cold or flu. Do not treat yourself. This drug decreases your body's ability to fight infections. Try to avoid being around people who are sick. This medicine may increase your risk to bruise or bleed. Call your doctor or health care professional if you notice any unusual bleeding. Be careful brushing and flossing your teeth or using a toothpick because you may get an infection or bleed more easily. If you have any dental work done, tell your dentist you are receiving this medicine. Avoid taking products that contain aspirin, acetaminophen, ibuprofen, naproxen, or ketoprofen unless instructed by your doctor. These medicines may hide a fever. Do not become pregnant while taking this medicine. Women should inform their doctor if they wish to become pregnant or think they might be pregnant. There is a potential for serious side effects to an unborn child. Talk to your health care professional or pharmacist for more information. Do not breast-feed an infant while taking this medicine. You may have vomiting or diarrhea while taking this medicine. Drink water or other fluids as directed. What side effects may I notice from receiving this medicine? Side effects that you should report to your doctor or health care professional as soon as possible: -allergic reactions like skin rash, itching or hives, swelling of the face, lips, or tongue -breathing problems -changes in hearing -changes in vision -fast, irregular heartbeat -feeling faint or lightheaded, falls -pain, tingling, numbness in the hands or feet -seizures -swelling of the ankles,  feet, hands -unusual bleeding or bruising -unusually weak or tired -vomiting Side effects that usually do not require medical attention (report to your doctor or health care professional if they continue or are bothersome): -changes in emotions or moods -constipation -diarrhea -loss of appetite -headache -irritation at site where injected -nausea This list may not describe all possible side effects. Call your doctor for medical advice about side effects. You may report side effects to FDA at 1-800-FDA-1088. Where should I keep my medicine? This drug is given in a hospital or clinic and will not be stored at home. NOTE: This sheet is a summary. It may not cover all possible information. If you have questions about this medicine, talk to your doctor, pharmacist, or health care provider.  2013, Elsevier/Gold Standard. (07/12/2010 11:42:36 AM)

## 2013-01-06 ENCOUNTER — Other Ambulatory Visit: Payer: Medicare Other

## 2013-01-06 ENCOUNTER — Encounter: Payer: Self-pay | Admitting: *Deleted

## 2013-01-06 LAB — CBC WITH DIFFERENTIAL (CANCER CENTER ONLY)
BASO#: 0 10*3/uL (ref 0.0–0.2)
EOS%: 0.7 % (ref 0.0–7.0)
HCT: 30.2 % — ABNORMAL LOW (ref 38.7–49.9)
HGB: 10.1 g/dL — ABNORMAL LOW (ref 13.0–17.1)
LYMPH#: 1.4 10*3/uL (ref 0.9–3.3)
LYMPH%: 25 % (ref 14.0–48.0)
MCHC: 33.4 g/dL (ref 32.0–35.9)
MCV: 101 fL — ABNORMAL HIGH (ref 82–98)
NEUT%: 67.3 % (ref 40.0–80.0)
RDW: 15.6 % (ref 11.1–15.7)

## 2013-01-06 NOTE — Progress Notes (Signed)
CC:   [name], M.D. Coralyn Pear, M.D.  DIAGNOSIS:  IgG lambda multiple myeloma.  CURRENT THERAPY: 1. The patient to start treatment with Revlimid/Velcade/Decadron. 2. Zometa 4 mg IV monthly.  INTERIM HISTORY:  Mr. Belton comes in for followup.  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  He does have myeloma.  When we saw him initially, his monoclonal spike was 1.89g/dL.  His IgG level was 2310 mg/dL.  His lambda light chain was 14.4 mg/dL.  We did do a bone marrow biopsy on him.  This was done on July 8th.  The bone marrow report ([id number]) showed a hypercellular marrow with plasma cell increase.  He has 17% plasma cells that looked atypical. We did do cytogenetics.  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  I feel that he does require therapy for the myeloma.  He does have the cytogenetic abnormalities.  He does feel tired.  He had been on methotrexate for rheumatoid arthritis.  He is currently off this right now.  His appetite is okay.  He does not feel like doing all that much.  There may be an element of depression that is going on with him.  He is on Celexa to try to help with his depression.  PHYSICAL EXAMINATION:  General:  This is an elderly white gentleman in no obvious distress.  Vital signs:  Temperature of 97.7, pulse 54, respiratory rate 18, blood pressure 96/57.  Weight is 145.  Head and neck:  Normocephalic, atraumatic skull.  There are no ocular or oral lesions.  There are no palpable cervical or supraclavicular lymph nodes. Lungs:  Clear bilaterally.  Cardiac:  Regular rate and rhythm with a normal S1 and S2.  There are no murmurs, rubs or bruits.  Abdomen:  Soft with good bowel sounds.  There is no palpable abdominal mass.  There is no fluid wave.  There is no palpable hepatosplenomegaly.  Back: No tenderness over the spine, ribs, or hips.  Extremities:  Show no clubbing, cyanosis or  edema.  Neurological:  Shows no focal neurological deficits.  LABORATORY STUDIES:  White cell count is 5.5, hemoglobin 10, hematocrit 30, platelet count is 147.  IMPRESSION:  Mr. Jandreau is an 79 year old gentleman with IgG lambda myeloma.  Again, we will go ahead with treatment on him.  I do not believe that he is going to be able to tolerate full dose therapy.  As such, for Revlimid I will start him at 20 mg a day.  He will do this 3 weeks and 1 week off.  We will do the Velcade weekly.  He should be able to tolerate this.  I will give him Decadron 20 mg p.o. every week with the Velcade.  This may help his rheumatism.  He will need Zometa.  Even though there are no definitive bony lesions, I think that Zometa would be helpful.  I talked to Mr. Bruington and his family at length.  I gave them information about the chemotherapy drugs.  I told him that this is something that we cannot cure.  Our goal, however, is to control it, and to get it in a very low level state so that it would not be troublesome to him.  It is really hard to say how much the myeloma is causing him problems at this point in time.  I did give him a prescription for Famvir.  I will make sure  he takes aspirin 162 mg a day to help with DVT prophylaxis.  I will make sure that he has some Compazine for any kind of nausea.  We will try to get started with treatment next week.  Hopefully, we will be able to get the Revlimid by then.  I will plan to see him back in 1 month, which will be the start of his second cycle of treatment.    ______________________________ Josph Macho, M.D. PRE/MEDQ  D:  01/05/2013  T:  01/06/2013  Job:  [id number]

## 2013-01-07 ENCOUNTER — Encounter: Payer: Self-pay | Admitting: Hematology & Oncology

## 2013-01-07 ENCOUNTER — Telehealth: Payer: Self-pay | Admitting: Hematology & Oncology

## 2013-01-07 LAB — PROTEIN ELECTROPHORESIS, SERUM, WITH REFLEX
Beta 2: 27.4 % — ABNORMAL HIGH (ref 3.2–6.5)
Beta Globulin: 5.2 % (ref 4.7–7.2)
M-Spike, %: 1.94 g/dL
Total Protein, Serum Electrophoresis: 7.9 g/dL (ref 6.0–8.3)

## 2013-01-07 LAB — IFE INTERPRETATION

## 2013-01-07 LAB — COMPREHENSIVE METABOLIC PANEL
AST: 14 U/L (ref 0–37)
BUN: 29 mg/dL — ABNORMAL HIGH (ref 6–23)
CO2: 26 mEq/L (ref 19–32)
Calcium: 9.3 mg/dL (ref 8.4–10.5)
Chloride: 101 mEq/L (ref 96–112)
Creatinine, Ser: 1.41 mg/dL — ABNORMAL HIGH (ref 0.50–1.35)
Total Bilirubin: 0.9 mg/dL (ref 0.3–1.2)

## 2013-01-07 LAB — KAPPA/LAMBDA LIGHT CHAINS: Kappa free light chain: 2.17 mg/dL — ABNORMAL HIGH (ref 0.33–1.94)

## 2013-01-07 LAB — IGG, IGA, IGM: IgM, Serum: 127 mg/dL (ref 41–251)

## 2013-01-07 LAB — RETICULOCYTES (CHCC): ABS Retic: 52 10*3/uL (ref 19.0–186.0)

## 2013-01-07 NOTE — Telephone Encounter (Signed)
[employer] called to Approve REVLINID for this member.

## 2013-01-08 ENCOUNTER — Encounter: Payer: Self-pay | Admitting: Hematology & Oncology

## 2013-01-08 ENCOUNTER — Encounter: Payer: Self-pay | Admitting: *Deleted

## 2013-01-08 NOTE — Progress Notes (Signed)
Patient approved for financial assistance through patient access network foundation.  Spoke to [employer], gave them patients [employer] authorization number and they will be in contact with patient today.

## 2013-01-12 ENCOUNTER — Other Ambulatory Visit: Payer: Medicare Other | Admitting: Lab

## 2013-01-12 ENCOUNTER — Ambulatory Visit (HOSPITAL_BASED_OUTPATIENT_CLINIC_OR_DEPARTMENT_OTHER): Payer: Medicare Other

## 2013-01-12 VITALS — BP 110/66 | HR 86 | Temp 97.9°F | Resp 18

## 2013-01-12 DIAGNOSIS — Z5112 Encounter for antineoplastic immunotherapy: Secondary | ICD-10-CM

## 2013-01-12 DIAGNOSIS — C9000 Multiple myeloma not having achieved remission: Secondary | ICD-10-CM

## 2013-01-12 MED ORDER — ZOLEDRONIC ACID 4 MG/5ML IV CONC
3.0000 mg | Freq: Once | INTRAVENOUS | Status: AC
  Administered 2013-01-12: 3 mg via INTRAVENOUS
  Filled 2013-01-12: qty 3.75

## 2013-01-12 MED ORDER — DEXAMETHASONE 4 MG PO TABS
20.0000 mg | ORAL_TABLET | ORAL | Status: DC
  Administered 2013-01-12: 20 mg via ORAL

## 2013-01-12 MED ORDER — BORTEZOMIB CHEMO SQ INJECTION 3.5 MG (2.5MG/ML)
1.3000 mg/m2 | Freq: Once | INTRAMUSCULAR | Status: AC
  Administered 2013-01-12: 2.25 mg via SUBCUTANEOUS
  Filled 2013-01-12: qty 2.25

## 2013-01-12 MED ORDER — SODIUM CHLORIDE 0.9 % IV SOLN
Freq: Once | INTRAVENOUS | Status: AC
  Administered 2013-01-12: 10:00:00 via INTRAVENOUS

## 2013-01-12 MED ORDER — ONDANSETRON HCL 8 MG PO TABS
8.0000 mg | ORAL_TABLET | Freq: Once | ORAL | Status: AC
  Administered 2013-01-12: 8 mg via ORAL

## 2013-01-12 NOTE — Patient Instructions (Addendum)
Bortezomib injection What is this medicine? BORTEZOMIB (bor TEZ oh mib) is a chemotherapy drug. It slows the growth of cancer cells. This medicine is used to treat multiple myeloma, lymphoma, and other cancers. This medicine may be used for other purposes; ask your health care provider or pharmacist if you have questions. What should I tell my health care provider before I take this medicine? They need to know if you have any of these conditions: -heart disease -irregular heartbeat -liver disease -low blood counts, like low white blood cells, platelets, or hemoglobin -peripheral neuropathy -taking medicine for blood pressure -an unusual or allergic reaction to bortezomib, mannitol, boron, other medicines, foods, dyes, or preservatives -pregnant or trying to get pregnant -breast-feeding How should I use this medicine? This medicine is for injection into a vein or for injection under the skin. It is given by a health care professional in a hospital or clinic setting. Talk to your pediatrician regarding the use of this medicine in children. Special care may be needed. Overdosage: If you think you have taken too much of this medicine contact a poison control center or emergency room at once. NOTE: This medicine is only for you. Do not share this medicine with others. What if I miss a dose? It is important not to miss your dose. Call your doctor or health care professional if you are unable to keep an appointment. What may interact with this medicine? -medicines for diabetes -medicines to increase blood counts like filgrastim, pegfilgrastim, sargramostim -zalcitabine Talk to your doctor or health care professional before taking any of these medicines: -acetaminophen -aspirin -ibuprofen -ketoprofen -naproxen This list may not describe all possible interactions. Give your health care provider a list of all the medicines, herbs, non-prescription drugs, or dietary supplements you use. Also  tell them if you smoke, drink alcohol, or use illegal drugs. Some items may interact with your medicine. What should I watch for while using this medicine? Visit your doctor for checks on your progress. This drug may make you feel generally unwell. This is not uncommon, as chemotherapy can affect healthy cells as well as cancer cells. Report any side effects. Continue your course of treatment even though you feel ill unless your doctor tells you to stop. You may get drowsy or dizzy. Do not drive, use machinery, or do anything that needs mental alertness until you know how this medicine affects you. Do not stand or sit up quickly, especially if you are an older patient. This reduces the risk of dizzy or fainting spells. In some cases, you may be given additional medicines to help with side effects. Follow all directions for their use. Call your doctor or health care professional for advice if you get a fever, chills or sore throat, or other symptoms of a cold or flu. Do not treat yourself. This drug decreases your body's ability to fight infections. Try to avoid being around people who are sick. This medicine may increase your risk to bruise or bleed. Call your doctor or health care professional if you notice any unusual bleeding. Be careful brushing and flossing your teeth or using a toothpick because you may get an infection or bleed more easily. If you have any dental work done, tell your dentist you are receiving this medicine. Avoid taking products that contain aspirin, acetaminophen, ibuprofen, naproxen, or ketoprofen unless instructed by your doctor. These medicines may hide a fever. Do not become pregnant while taking this medicine. Women should inform their doctor if they wish to   become pregnant or think they might be pregnant. There is a potential for serious side effects to an unborn child. Talk to your health care professional or pharmacist for more information. Do not breast-feed an infant while  taking this medicine. You may have vomiting or diarrhea while taking this medicine. Drink water or other fluids as directed. What side effects may I notice from receiving this medicine? Side effects that you should report to your doctor or health care professional as soon as possible: -allergic reactions like skin rash, itching or hives, swelling of the face, lips, or tongue -breathing problems -changes in hearing -changes in vision -fast, irregular heartbeat -feeling faint or lightheaded, falls -pain, tingling, numbness in the hands or feet -seizures -swelling of the ankles, feet, hands -unusual bleeding or bruising -unusually weak or tired -vomiting Side effects that usually do not require medical attention (report to your doctor or health care professional if they continue or are bothersome): -changes in emotions or moods -constipation -diarrhea -loss of appetite -headache -irritation at site where injected -nausea This list may not describe all possible side effects. Call your doctor for medical advice about side effects. You may report side effects to FDA at 1-800-FDA-1088. Where should I keep my medicine? This drug is given in a hospital or clinic and will not be stored at home. NOTE: This sheet is a summary. It may not cover all possible information. If you have questions about this medicine, talk to your doctor, pharmacist, or health care provider.  2013, [employer]. (07/12/2010 11:42:36 AM)   Zoledronic Acid injection (Hypercalcemia, Oncology) What is this medicine? ZOLEDRONIC ACID (ZOE le dron ik AS id) lowers the amount of calcium loss from bone. It is used to treat too much calcium in your blood from cancer. It is also used to prevent complications of cancer that has spread to the bone. This medicine may be used for other purposes; ask your health care provider or pharmacist if you have questions. What should I tell my health care provider before I take this  medicine? They need to know if you have any of these conditions: -aspirin-sensitive asthma -dental disease -kidney disease -an unusual or allergic reaction to zoledronic acid, other medicines, foods, dyes, or preservatives -pregnant or trying to get pregnant -breast-feeding How should I use this medicine? This medicine is for infusion into a vein. It is given by a health care professional in a hospital or clinic setting. Talk to your pediatrician regarding the use of this medicine in children. Special care may be needed. Overdosage: If you think you have taken too much of this medicine contact a poison control center or emergency room at once. NOTE: This medicine is only for you. Do not share this medicine with others. What if I miss a dose? It is important not to miss your dose. Call your doctor or health care professional if you are unable to keep an appointment. What may interact with this medicine? -certain antibiotics given by injection -NSAIDs, medicines for pain and inflammation, like ibuprofen or naproxen -some diuretics like bumetanide, furosemide -teriparatide -thalidomide This list may not describe all possible interactions. Give your health care provider a list of all the medicines, herbs, non-prescription drugs, or dietary supplements you use. Also tell them if you smoke, drink alcohol, or use illegal drugs. Some items may interact with your medicine. What should I watch for while using this medicine? Visit your doctor or health care professional for regular checkups. It may be some time before  you see the benefit from this medicine. Do not stop taking your medicine unless your doctor tells you to. Your doctor may order blood tests or other tests to see how you are doing. Women should inform their doctor if they wish to become pregnant or think they might be pregnant. There is a potential for serious side effects to an unborn child. Talk to your health care professional or  pharmacist for more information. You should make sure that you get enough calcium and vitamin D while you are taking this medicine. Discuss the foods you eat and the vitamins you take with your health care professional. Some people who take this medicine have severe bone, joint, and/or muscle pain. This medicine may also increase your risk for a broken thigh bone. Tell your doctor right away if you have pain in your upper leg or groin. Tell your doctor if you have any pain that does not go away or that gets worse. What side effects may I notice from receiving this medicine? Side effects that you should report to your doctor or health care professional as soon as possible: -allergic reactions like skin rash, itching or hives, swelling of the face, lips, or tongue -anxiety, confusion, or depression -breathing problems -changes in vision -feeling faint or lightheaded, falls -jaw burning, cramping, pain -muscle cramps, stiffness, or weakness -trouble passing urine or change in the amount of urine Side effects that usually do not require medical attention (report to your doctor or health care professional if they continue or are bothersome): -bone, joint, or muscle pain -fever -hair loss -irritation at site where injected -loss of appetite -nausea, vomiting -stomach upset -tired This list may not describe all possible side effects. Call your doctor for medical advice about side effects. You may report side effects to FDA at 1-800-FDA-1088. Where should I keep my medicine? This drug is given in a hospital or clinic and will not be stored at home. NOTE: This sheet is a summary. It may not cover all possible information. If you have questions about this medicine, talk to your doctor, pharmacist, or health care provider.  2013, [employer] Standard. (12/01/2010 9:06:58 AM)

## 2013-01-13 ENCOUNTER — Encounter: Payer: Self-pay | Admitting: Hematology & Oncology

## 2013-01-16 ENCOUNTER — Emergency Department (HOSPITAL_BASED_OUTPATIENT_CLINIC_OR_DEPARTMENT_OTHER): Payer: Medicare Other

## 2013-01-16 ENCOUNTER — Inpatient Hospital Stay (HOSPITAL_BASED_OUTPATIENT_CLINIC_OR_DEPARTMENT_OTHER)
Admission: EM | Admit: 2013-01-16 | Discharge: 2013-01-18 | DRG: 389 | Disposition: A | Discharge status: Home / Self Care | Payer: Medicare Other | Attending: Internal Medicine | Admitting: Internal Medicine

## 2013-01-16 ENCOUNTER — Encounter (HOSPITAL_BASED_OUTPATIENT_CLINIC_OR_DEPARTMENT_OTHER): Payer: Self-pay | Admitting: Family Medicine

## 2013-01-16 DIAGNOSIS — E871 Hypo-osmolality and hyponatremia: Secondary | ICD-10-CM | POA: Diagnosis present

## 2013-01-16 DIAGNOSIS — R5383 Other fatigue: Secondary | ICD-10-CM | POA: Diagnosis present

## 2013-01-16 DIAGNOSIS — C9000 Multiple myeloma not having achieved remission: Secondary | ICD-10-CM | POA: Diagnosis present

## 2013-01-16 DIAGNOSIS — K2940 Chronic atrophic gastritis without bleeding: Secondary | ICD-10-CM | POA: Diagnosis present

## 2013-01-16 DIAGNOSIS — K5641 Fecal impaction: Principal | ICD-10-CM

## 2013-01-16 DIAGNOSIS — Z87891 Personal history of nicotine dependence: Secondary | ICD-10-CM

## 2013-01-16 DIAGNOSIS — I10 Essential (primary) hypertension: Secondary | ICD-10-CM | POA: Diagnosis present

## 2013-01-16 DIAGNOSIS — K5900 Constipation, unspecified: Secondary | ICD-10-CM

## 2013-01-16 DIAGNOSIS — Z791 Long term (current) use of non-steroidal anti-inflammatories (NSAID): Secondary | ICD-10-CM

## 2013-01-16 DIAGNOSIS — K219 Gastro-esophageal reflux disease without esophagitis: Secondary | ICD-10-CM | POA: Diagnosis present

## 2013-01-16 DIAGNOSIS — R112 Nausea with vomiting, unspecified: Secondary | ICD-10-CM | POA: Diagnosis present

## 2013-01-16 DIAGNOSIS — K5289 Other specified noninfective gastroenteritis and colitis: Secondary | ICD-10-CM | POA: Diagnosis present

## 2013-01-16 DIAGNOSIS — Z79899 Other long term (current) drug therapy: Secondary | ICD-10-CM

## 2013-01-16 DIAGNOSIS — Z9221 Personal history of antineoplastic chemotherapy: Secondary | ICD-10-CM

## 2013-01-16 DIAGNOSIS — D649 Anemia, unspecified: Secondary | ICD-10-CM | POA: Diagnosis present

## 2013-01-16 DIAGNOSIS — R110 Nausea: Secondary | ICD-10-CM

## 2013-01-16 DIAGNOSIS — R5381 Other malaise: Secondary | ICD-10-CM | POA: Diagnosis present

## 2013-01-16 DIAGNOSIS — K5730 Diverticulosis of large intestine without perforation or abscess without bleeding: Secondary | ICD-10-CM

## 2013-01-16 DIAGNOSIS — R109 Unspecified abdominal pain: Secondary | ICD-10-CM | POA: Diagnosis present

## 2013-01-16 DIAGNOSIS — M069 Rheumatoid arthritis, unspecified: Secondary | ICD-10-CM | POA: Diagnosis present

## 2013-01-16 LAB — COMPREHENSIVE METABOLIC PANEL
AST: 17 U/L (ref 0–37)
Albumin: 3.4 g/dL — ABNORMAL LOW (ref 3.5–5.2)
BUN: 28 mg/dL — ABNORMAL HIGH (ref 6–23)
Calcium: 8.8 mg/dL (ref 8.4–10.5)
Creatinine, Ser: 1.1 mg/dL (ref 0.50–1.35)
Total Protein: 7.6 g/dL (ref 6.0–8.3)

## 2013-01-16 LAB — URINALYSIS, ROUTINE W REFLEX MICROSCOPIC
Glucose, UA: 100 mg/dL — AB
Ketones, ur: NEGATIVE mg/dL
Leukocytes, UA: NEGATIVE
Nitrite: NEGATIVE
Specific Gravity, Urine: 1.022 (ref 1.005–1.030)
pH: 5.5 (ref 5.0–8.0)

## 2013-01-16 LAB — CBC WITH DIFFERENTIAL/PLATELET
Basophils Absolute: 0 10*3/uL (ref 0.0–0.1)
Basophils Relative: 0 % (ref 0–1)
Eosinophils Absolute: 0 10*3/uL (ref 0.0–0.7)
HCT: 32.4 % — ABNORMAL LOW (ref 39.0–52.0)
Hemoglobin: 11 g/dL — ABNORMAL LOW (ref 13.0–17.0)
MCH: 33.1 pg (ref 26.0–34.0)
MCHC: 34 g/dL (ref 30.0–36.0)
Monocytes Absolute: 0.4 10*3/uL (ref 0.1–1.0)
Monocytes Relative: 5 % (ref 3–12)
Neutro Abs: 7.3 10*3/uL (ref 1.7–7.7)
RDW: 14.9 % (ref 11.5–15.5)

## 2013-01-16 LAB — LIPASE, BLOOD: Lipase: 70 U/L — ABNORMAL HIGH (ref 11–59)

## 2013-01-16 LAB — CG4 I-STAT (LACTIC ACID): Lactic Acid, Venous: 2.45 mmol/L — ABNORMAL HIGH (ref 0.5–2.2)

## 2013-01-16 MED ORDER — FENTANYL CITRATE 0.05 MG/ML IJ SOLN
12.5000 ug | Freq: Once | INTRAMUSCULAR | Status: AC
  Administered 2013-01-16: 12.5 ug via INTRAVENOUS
  Filled 2013-01-16: qty 2

## 2013-01-16 MED ORDER — MORPHINE SULFATE 2 MG/ML IJ SOLN: 1.0000 mg | Freq: Four times a day (QID) | INTRAMUSCULAR | Status: DC | PRN

## 2013-01-16 MED ORDER — SODIUM CHLORIDE 0.9 % IV SOLN
INTRAVENOUS | Status: AC
  Administered 2013-01-16: 23:00:00 via INTRAVENOUS

## 2013-01-16 MED ORDER — PEG 3350-KCL-NA BICARB-NACL 420 G PO SOLR
4000.0000 mL | Freq: Once | ORAL | Status: DC
  Filled 2013-01-16: qty 4000

## 2013-01-16 MED ORDER — PROMETHAZINE HCL 25 MG PO TABS: 25.0000 mg | ORAL_TABLET | Freq: Four times a day (QID) | ORAL | Status: DC | PRN

## 2013-01-16 MED ORDER — MINERAL OIL RE ENEM
1.0000 | ENEMA | Freq: Once | RECTAL | Status: AC
  Administered 2013-01-16: 1 via RECTAL
  Filled 2013-01-16 (×2): qty 1

## 2013-01-16 MED ORDER — BISACODYL 10 MG RE SUPP
RECTAL | Status: AC
  Administered 2013-01-16: 10 mg
  Filled 2013-01-16: qty 1

## 2013-01-16 MED ORDER — PANTOPRAZOLE SODIUM 40 MG IV SOLR
40.0000 mg | Freq: Two times a day (BID) | INTRAVENOUS | Status: DC
  Administered 2013-01-16 – 2013-01-17 (×2): 40 mg via INTRAVENOUS
  Filled 2013-01-16 (×3): qty 40

## 2013-01-16 MED ORDER — POLYETHYLENE GLYCOL 3350 17 G PO PACK: 17.0000 g | PACK | Freq: Every day | ORAL | Status: DC

## 2013-01-16 MED ORDER — ONDANSETRON HCL 4 MG/2ML IJ SOLN
INTRAMUSCULAR | Status: AC
  Administered 2013-01-16: 4 mg
  Filled 2013-01-16: qty 2

## 2013-01-16 MED ORDER — ONDANSETRON 8 MG PO TBDP: 8.0000 mg | ORAL_TABLET | Freq: Two times a day (BID) | ORAL | Status: DC | PRN

## 2013-01-16 MED ORDER — IOHEXOL 300 MG/ML  SOLN
50.0000 mL | Freq: Once | INTRAMUSCULAR | Status: AC | PRN
  Administered 2013-01-16: 50 mL via ORAL

## 2013-01-16 MED ORDER — ONDANSETRON HCL 4 MG/2ML IJ SOLN: 4.0000 mg | Freq: Once | INTRAMUSCULAR | Status: DC

## 2013-01-16 MED ORDER — ONDANSETRON HCL 4 MG/2ML IJ SOLN
4.0000 mg | Freq: Four times a day (QID) | INTRAMUSCULAR | Status: DC | PRN
  Administered 2013-01-17: 4 mg via INTRAVENOUS
  Filled 2013-01-16: qty 2

## 2013-01-16 MED ORDER — SODIUM CHLORIDE 0.9 % IV BOLUS (SEPSIS)
1000.0000 mL | INTRAVENOUS | Status: AC
  Administered 2013-01-16: 1000 mL via INTRAVENOUS

## 2013-01-16 MED ORDER — SODIUM CHLORIDE 0.9 % IV BOLUS (SEPSIS): 1000.0000 mL | INTRAVENOUS | Status: AC

## 2013-01-16 MED ORDER — DEXTROSE 5 % IV SOLN
Freq: Once | INTRAVENOUS | Status: AC
  Administered 2013-01-16: 21:00:00 via INTRAVENOUS

## 2013-01-16 MED ORDER — IOHEXOL 300 MG/ML  SOLN
100.0000 mL | Freq: Once | INTRAMUSCULAR | Status: AC | PRN
  Administered 2013-01-16: 100 mL via INTRAVENOUS

## 2013-01-16 MED ORDER — PANTOPRAZOLE SODIUM 40 MG IV SOLR
40.0000 mg | Freq: Once | INTRAVENOUS | Status: AC
  Administered 2013-01-16: 40 mg via INTRAVENOUS
  Filled 2013-01-16: qty 40

## 2013-01-16 NOTE — ED Provider Notes (Signed)
CSN: [id number]     Arrival date & time 01/16/13  1200 History     First MD Initiated Contact with Patient 01/16/13 1329     Chief Complaint  Patient presents with  . Constipation  . Abdominal Pain   (Consider location/radiation/quality/duration/timing/severity/associated sxs/prior Treatment) Patient is a 79 y.o. male presenting with constipation and abdominal pain. The history is provided by the patient.  Constipation Severity:  Moderate Time since last bowel movement:  12 hours Timing:  Intermittent Progression:  Waxing and waning Chronicity:  New Stool description:  None produced Relieved by:  Nothing Worsened by:  Nothing tried Associated symptoms: abdominal pain and vomiting (once today after taking magnesium citrate)   Associated symptoms: no diarrhea, no dysuria, no fever and no nausea   Abdominal pain:    Pain location: lower abdomen.   Quality:  Aching   Severity:  Moderate   Onset quality:  Gradual   Duration:  12 hours   Timing:  Intermittent   Progression:  Waxing and waning   Chronicity:  New Vomiting:    Quality:  Stomach contents   Number of occurrences:  1   Severity:  Mild Abdominal Pain Associated symptoms include abdominal pain. Pertinent negatives include no chest pain, no headaches and no shortness of breath.    Past Medical History  Diagnosis Date  . Diverticulosis of colon (without mention of hemorrhage)   . Esophagitis, unspecified   . 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 Personal history of colonic polyps   . Reflux   . Hernia   . RA (rheumatoid arthritis)   . Myeloma     possiblity of this after last physical, to follow up with hematology  . Multiple myeloma, without mention of having achieved remission(203.00) 01/05/2013   Past Surgical History  Procedure Laterality Date  . Inguinal hernia repair  03/28/1990    dr price  . Orif hip fracture     Family History  Problem Relation Age of Onset  . Colon cancer Neg Hx     History  Substance Use Topics  . Smoking status: Former Smoker    Quit date: 06/18/1990  . Smokeless tobacco: Never Used  . Alcohol Use: No    Review of Systems  Constitutional: Negative for fever.  HENT: Negative for rhinorrhea, drooling and neck pain.   Eyes: Negative for pain.  Respiratory: Negative for cough and shortness of breath.   Cardiovascular: Negative for chest pain and leg swelling.  Gastrointestinal: Positive for vomiting (once today after taking magnesium citrate), abdominal pain and constipation. Negative for nausea and diarrhea.  Genitourinary: Negative for dysuria and hematuria.  Musculoskeletal: Negative for gait problem.  Skin: Negative for color change.  Neurological: Negative for numbness and headaches.  Hematological: Negative for adenopathy.  Psychiatric/Behavioral: Negative for behavioral problems.  All other systems reviewed and are negative.    Allergies  Review of patient's allergies indicates no known allergies.  Home Medications   Current Outpatient Rx  Name  Route  Sig  Dispense  Refill  . citalopram (CELEXA) 10 MG tablet   Oral   Take 10 mg by mouth daily.          [name] diltiazem (CARDIZEM CD) 240 MG 24 hr capsule   Oral   Take 240 mg by mouth daily.          . famciclovir (FAMVIR) 250 MG tablet   Oral   Take 1 tablet (250 mg total) by mouth daily.  30 tablet   6   . Lenalidomide 20 MG CAPS   Oral   Take 20 mg by mouth daily.   21 capsule   6   . metoprolol (TOPROL-XL) 50 MG 24 hr tablet   Oral   Take 50 mg by mouth daily. Take 1/2 of a tablet by mouth every day          . Multiple Vitamins-Minerals (SENIOR MULTIVITAMIN PLUS PO)   Oral   Take by mouth every morning.         . ondansetron (ZOFRAN-ODT) 8 MG disintegrating tablet   Oral   Take 1 tablet (8 mg total) by mouth every 12 (twelve) hours as needed for nausea.   20 tablet   0   . pantoprazole (PROTONIX) 20 MG tablet   Oral   Take 1 tablet (20 mg total)  by mouth as needed.   30 tablet   9   . polyethylene glycol (MIRALAX / GLYCOLAX) packet   Oral   Take 17 g by mouth daily.   5 each   0   . prochlorperazine (COMPAZINE) 5 MG tablet   Oral   Take 1 tablet (5 mg total) by mouth every 6 (six) hours as needed.   60 tablet   4   . promethazine (PHENERGAN) 25 MG tablet   Oral   Take 1 tablet (25 mg total) by mouth every 6 (six) hours as needed for nausea.   20 tablet   0   . simvastatin (ZOCOR) 40 MG tablet   Oral   Take 40 mg by mouth daily.           . tamsulosin (FLOMAX) 0.4 MG CAPS   Oral   Take 0.4 mg by mouth once a week.           BP 120/65  Pulse 81  Temp(Src) 97.9 F (36.6 C) (Oral)  Resp 18  Ht 5\' 10"  (1.778 m)  Wt 145 lb (65.772 kg)  BMI 20.81 kg/m2  SpO2 96% Physical Exam  Nursing note and vitals reviewed. Constitutional: He is oriented to person, place, and time. He appears well-developed and well-nourished.  Pt appears tired.   HENT:  Head: Normocephalic and atraumatic.  Right Ear: External ear normal.  Left Ear: External ear normal.  Nose: Nose normal.  Mouth/Throat: Oropharynx is clear and moist. No oropharyngeal exudate.  Eyes: Conjunctivae and EOM are normal. Pupils are equal, round, and reactive to light.  Neck: Normal range of motion. Neck supple.  Cardiovascular: Normal rate, regular rhythm, normal heart sounds and intact distal pulses.  Exam reveals no gallop and no friction rub.   No murmur heard. Pulmonary/Chest: Effort normal and breath sounds normal. No respiratory distress. He has no wheezes.  Abdominal: Soft. Bowel sounds are normal. He exhibits no distension. There is tenderness (mild ttp of lower abdomen diffusely). There is no rebound and no guarding.  Genitourinary: Rectum normal and prostate normal.  Normal palpation during rectal exam. Several small hard stool balls noted in rectal vault. Brown stool.   Musculoskeletal: Normal range of motion. He exhibits no edema and no  tenderness.  Neurological: He is alert and oriented to person, place, and time.  Skin: Skin is warm and dry.  Psychiatric: He has a normal mood and affect. His behavior is normal.    ED Course   Procedures (including critical care time)  Labs Reviewed  CBC WITH DIFFERENTIAL - Abnormal; Notable for the following:    RBC  3.32 (*)    Hemoglobin 11.0 (*)    HCT 32.4 (*)    Neutrophils Relative % 87 (*)    Lymphocytes Relative 8 (*)    All other components within normal limits  COMPREHENSIVE METABOLIC PANEL - Abnormal; Notable for the following:    Sodium 132 (*)    Glucose, Bld 196 (*)    BUN 28 (*)    Albumin 3.4 (*)    GFR calc non Af Amer 60 (*)    GFR calc Af Amer 69 (*)    All other components within normal limits  LIPASE, BLOOD - Abnormal; Notable for the following:    Lipase 70 (*)    All other components within normal limits  URINALYSIS, ROUTINE W REFLEX MICROSCOPIC - Abnormal; Notable for the following:    Glucose, UA 100 (*)    All other components within normal limits  CG4 I-STAT (LACTIC ACID) - Abnormal; Notable for the following:    Lactic Acid, Venous 2.45 (*)    All other components within normal limits  URINE CULTURE  OCCULT BLOOD X 1 CARD TO LAB, STOOL  CG4 I-STAT (LACTIC ACID)   Ct Abdomen Pelvis W Contrast  01/16/2013   *RADIOLOGY REPORT*  Clinical Data: Lower abdominal pain.  Patient diagnosed with multiple myeloma 3 weeks ago and underwent first chemotherapy treatment 4 days ago.  Prior history of diverticulitis.  Surgical history includes inguinal hernia repair.  CT ABDOMEN AND PELVIS WITH CONTRAST  Technique:  Multidetector CT imaging of the abdomen and pelvis was performed following the standard protocol during bolus administration of intravenous contrast.  Contrast: 100 ml Omnipaque-300 IV.  Oral contrast was also administered.  Comparison: CT abdomen and pelvis 05/08/2012, 08/11/2010.  Findings: Very large stool burden in the rectum and in the tortuous  sigmoid colon.  Scattered diverticula involving the descending and sigmoid colon without evidence of acute diverticulitis currently. Upper normal caliber to mildly distended transverse colon and ascending colon containing liquid stool.  Wall thickening involving the proximal descending colon, without evidence of significant colonic wall thickening elsewhere.  Solitary diverticulum arising from the cecum without evidence of acute diverticulitis.  Large hiatal hernia.  Normal appearing small bowel.  No ascites. Appendix not visualized but no pericecal inflammation.  Mild diffuse hepatic steatosis without focal hepatic parenchymal abnormality.  Normal appearing spleen, pancreas, and adrenal glands.  Focus of accessory splenic tissue anterior to the lower pole of the spleen.  Bilateral renal cysts, unchanged from the prior examination.  Approximate 9 mm gallstone within the otherwise normal-appearing gallbladder.  No biliary ductal dilation. Moderate aorto-iliofemoral atherosclerosis without aneurysm.  No significant lymphadenopathy.  Moderate prostate gland enlargement.  Wall thickening involving the dome of the bladder as noted previously, unchanged.  Numerous pelvic phleboliths.  Bilateral inguinal hernia repair with mesh. No evidence of recurrent hernia.  Prior ORIF of a left femoral neck fracture with healing.  Bone window images demonstrate generalized osseous demineralization, compression fractures of T12 and L1 (visualized on the metastatic bone survey 11/24/2012).  Scarring and expected dependent atelectasis in the visualized lower lobes.  Heart enlarged but stable.  IMPRESSION:  1.  Probable rectal and sigmoid colon fecal impaction.  This causes mild distention of the transverse colon, ascending colon, and cecum. 2.  Focal wall thickening involving the proximal descending colon. This may indicate localized colitis. 3.  No acute abnormalities otherwise involving the abdomen or pelvis. 4.  Cholelithiasis without  evidence of acute cholecystitis. 5.  Mild diffuse hepatic  steatosis without focal hepatic parenchymal abnormality. 6.  Stable large hiatal hernia. 7.  Stable prostate gland enlargement.  Wall thickening involving the dome of the bladder is unchanged and likely due to chronic outlet obstruction.   Original Report Authenticated By: Hulan Saas, M.D.   Dg Abd Acute W/chest  01/16/2013   *RADIOLOGY REPORT*  Clinical Data: Vomiting and nausea.  Constipation.  ACUTE ABDOMEN SERIES (ABDOMEN 2 VIEW & CHEST 1 VIEW)  Comparison: Bone survey from 11/24/2012.  CT scan from 05/08/2012  Findings: Hyperexpansion is consistent with emphysema. Nodular density overlying the right midlung is stable.  No edema or focal airspace consolidation. The cardiopericardial silhouette is enlarged.  Moderate hiatal hernia noted.  Upright film shows no evidence for intraperitoneal free air. There is no evidence for gaseous bowel dilation to suggest obstruction. Prominent stool noted in the sigmoid colon and rectum.  The patient is status post ventral mesh placement in the lower anterior abdominal wall.  Incomplete visualization of hardware used for right hip fixation.  IMPRESSION: Stable appearance of right mid lung nodule.  CT chest without contrast recommended to further evaluate.  No evidence for bowel perforation or obstruction.   Original Report Authenticated By: Kennith Center, M.D.   1. Constipation   2. Fecal impaction of colon   3. Nausea   4. Abdominal pain      Date: 01/16/2013  Rate: 67  Rhythm: normal sinus rhythm  QRS Axis: normal  Intervals: PR prolonged, mildly prolonged QTc  ST/T Wave abnormalities: nonspecific ST changes  Conduction Disutrbances:right bundle branch block  Narrative Interpretation: sinus rhythm, 1st degree AV block, non-spec ST changes, RBBB, borderline RAD  Old EKG Reviewed: changes noted   MDM  10:22 PM 79 y.o. male w recent dx of MM who started therapy for this 4 days ago pw lower abd  pain that began last night. Pt AFVSS here, pain 3/10, abd soft on exam. Pt cannot rem when he had his last bm. Will get labs, IVF, CT abd. Pt refuses pain medicine at this time.   Pt is s/p 2L IVF, mild elev in lactate now improved. Will transfer care to Dr. [name] while awaiting CT scan.         Junius Argyle, MD 01/16/13 2223

## 2013-01-16 NOTE — H&P (Signed)
Shawn Chen is an 79 y.o. male.   PCP:   [name], MD   Chief Complaint:  Severe Fecal Impaction c Nausea and vomiting not amenable to manual disempaction or Bowel Prep  HPI: 86 ma with IgG MM and stared treatments (Revlimid/Velcade/Decadron/Zometa) this week presented to ED today with Severe AB pain, constipation, N/V. Pt tried outpt preparations to help him go and had episode of vomiting after drinking mag citrate. W/up included abd CT which showed colitis in proximal descending colon, no diverticulitis and severe colonic fecal impaction.  Initial elevated lactic acid is improving with IVF's. Enema and disimpaction attempted, and unsuccessful. Pt continued to vomit in ED and I was called for inpt admission - We transferred him from [facility] to assigned to room 1304 @ [employer] via Shawn Chen. Per nursing [facility] at [facility]: 1) was given a fleets oil enema>no results 2) was then given a small soap suds enema and a dulcolax supp. And then tried to disimpact>the stool was too high. 3)he was then given another fleets oil enema per the PA and then irrigated with 1000cc d5w. After 5-10 min he was helped back to the [facility], where he was for about 36min>small amt of brown water ,no stool returned. Cleaned and put back to bed. He then started vomiting again. Remains alert but weak.  Seen in room 1304 and he is uncomfortable but denies pain at this time.[name]  No CP/SOB.        Past Medical History:  Past Medical History  Diagnosis Date  . Diverticulosis of colon (without mention of hemorrhage)   . Esophagitis, unspecified   . 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 Personal history of colonic polyps   . Reflux   . Hernia   . RA (rheumatoid arthritis)   . Myeloma     possiblity of this after last physical, to follow up with hematology  . Multiple myeloma, without mention of having achieved remission(203.00) 01/05/2013    Past Surgical History  Procedure Laterality Date  .  Inguinal hernia repair  03/28/1990    dr price  . Orif hip fracture        Allergies:  No Known Allergies   Medications: Prior to Admission medications   Medication Sig Start Date End Date Taking? Authorizing Provider  citalopram (CELEXA) 10 MG tablet Take 10 mg by mouth daily.  11/12/12   Historical Provider, MD  diltiazem (CARDIZEM CD) 240 MG 24 hr capsule Take 240 mg by mouth daily.  11/01/12   Historical Provider, MD  famciclovir (FAMVIR) 250 MG tablet Take 1 tablet (250 mg total) by mouth daily. 01/05/13   Josph Macho, MD  Lenalidomide 20 MG CAPS Take 20 mg by mouth daily. 01/05/13   Josph Macho, MD  metoprolol (TOPROL-XL) 50 MG 24 hr tablet Take 50 mg by mouth daily. Take 1/2 of a tablet by mouth every day     Historical Provider, MD  Multiple Vitamins-Minerals (SENIOR MULTIVITAMIN PLUS PO) Take by mouth every morning.    Historical Provider, MD  ondansetron (ZOFRAN-ODT) 8 MG disintegrating tablet Take 1 tablet (8 mg total) by mouth every 12 (twelve) hours as needed for nausea. 01/16/13   [name]. [name], MD  pantoprazole (PROTONIX) 20 MG tablet Take 1 tablet (20 mg total) by mouth as needed. 07/28/12   Mardella Layman, MD  polyethylene glycol [facility] Ethelene Hal) packet Take 17 g by mouth daily. 01/16/13   [name]. [name], MD  prochlorperazine (COMPAZINE) 5 MG tablet Take 1 tablet (5 mg total) by mouth every 6 (six) hours as needed. 01/05/13   Josph Macho, MD  promethazine (PHENERGAN) 25 MG tablet Take 1 tablet (25 mg total) by mouth every 6 (six) hours as needed for nausea. 01/16/13   [name]. [name], MD  simvastatin (ZOCOR) 40 MG tablet Take 40 mg by mouth daily.      Historical Provider, MD  tamsulosin (FLOMAX) 0.4 MG CAPS Take 0.4 mg by mouth once a week.  11/12/12   Historical Provider, MD     Medications Prior to Admission  Medication Sig Dispense Refill  . citalopram (CELEXA) 10 MG tablet Take 10 mg by mouth daily.       [name] diltiazem (CARDIZEM CD) 240 MG 24 hr capsule  Take 240 mg by mouth daily.       . famciclovir (FAMVIR) 250 MG tablet Take 1 tablet (250 mg total) by mouth daily.  30 tablet  6  . Lenalidomide 20 MG CAPS Take 20 mg by mouth daily.  21 capsule  6  . metoprolol (TOPROL-XL) 50 MG 24 hr tablet Take 50 mg by mouth daily. Take 1/2 of a tablet by mouth every day       . Multiple Vitamins-Minerals (SENIOR MULTIVITAMIN PLUS PO) Take by mouth every morning.      . pantoprazole (PROTONIX) 20 MG tablet Take 1 tablet (20 mg total) by mouth as needed.  30 tablet  9  . prochlorperazine (COMPAZINE) 5 MG tablet Take 1 tablet (5 mg total) by mouth every 6 (six) hours as needed.  60 tablet  4  . simvastatin (ZOCOR) 40 MG tablet Take 40 mg by mouth daily.        . tamsulosin (FLOMAX) 0.4 MG CAPS Take 0.4 mg by mouth once a week.          Social History:  reports that he quit smoking about 22 years ago. He has never used smokeless tobacco. He reports that he does not drink alcohol or use illicit drugs.  Family History: Family History  Problem Relation Age of Onset  . Colon cancer Neg Hx     Review of Systems:  Review of Systems - Full ros obtained. See HPI. No fever, chills, CP/SOB, or urinary Sxs.   Physical Exam:  Blood pressure 120/65, pulse 81, temperature 97.9 F (36.6 C), temperature source Oral, resp. rate 18, height 5\' 10"  (1.778 m), weight 65.772 kg (145 lb), SpO2 96.00%. Filed Vitals:   01/16/13 1210 01/16/13 1530 01/16/13 1945 01/16/13 2206  BP: 116/47 122/54 140/64 120/65  Pulse: 60 68 86 81  Temp: 97.5 F (36.4 C) 98.3 F (36.8 C) 97.9 F (36.6 C)   TempSrc: Oral Oral Oral   Resp: 16 18 20 18   Height: 5\' 10"  (1.778 m)     Weight: 65.772 kg (145 lb)     SpO2: 100% 99% 97% 96%   General appearance: lying still on bed.   Head: Normocephalic, without obvious abnormality, atraumatic Eyes: conjunctivae/corneas clear. PERRL, EOM's intact.  Nose: Nares normal. Septum midline. Mucosa normal. No drainage or sinus tenderness. OP -  Dry. Neck: no adenopathy, no carotid bruit, no JVD and thyroid not enlarged, symmetric, no tenderness/mass/nodules Resp: CTA Cardio: Reg GI: softer than expected.  Diminished but present BS. min-tender no masses,  no organomegaly Extremities: extremities normal, atraumatic, no cyanosis or edema Pulses: 2+ and symmetric Lymph nodes: No cervical lymphadenopathy Neurologic: Alert and oriented X 3. gen  Weakness.    Labs on Admission:   Recent Labs  01/16/13 1220  NA 132*  K 4.0  CL 97  CO2 22  GLUCOSE 196*  BUN 28*  CREATININE 1.10  CALCIUM 8.8    Recent Labs  01/16/13 1220  AST 17  ALT 14  ALKPHOS 70  BILITOT 0.8  PROT 7.6  ALBUMIN 3.4*    Recent Labs  01/16/13 1220  LIPASE 70*    Recent Labs  01/16/13 1220  WBC 8.3  NEUTROABS 7.3  HGB 11.0*  HCT 32.4*  MCV 97.6  PLT 176   No results found for this basename: CKTOTAL, CKMB, CKMBINDEX, TROPONINI,  in the last 72 hours Lab Results  Component Value Date   INR 1.27 01/21/2010   INR 1.36 01/20/2010   INR 1.32 01/19/2010     LAB RESULT POCT:  Results for orders placed during the hospital encounter of 01/16/13  CBC WITH DIFFERENTIAL      Result Value Range   WBC 8.3  4.0 - 10.5 K/uL   RBC 3.32 (*) 4.22 - 5.81 MIL/uL   Hemoglobin 11.0 (*) 13.0 - 17.0 g/dL   HCT 40.9 (*) 81.1 - 91.4 %   MCV 97.6  78.0 - 100.0 fL   MCH 33.1  26.0 - 34.0 pg   MCHC 34.0  30.0 - 36.0 g/dL   RDW 78.2  95.6 - 21.3 %   Platelets 176  150 - 400 K/uL   Neutrophils Relative % 87 (*) 43 - 77 %   Neutro Abs 7.3  1.7 - 7.7 K/uL   Lymphocytes Relative 8 (*) 12 - 46 %   Lymphs Abs 0.7  0.7 - 4.0 K/uL   Monocytes Relative 5  3 - 12 %   Monocytes Absolute 0.4  0.1 - 1.0 K/uL   Eosinophils Relative 0  0 - 5 %   Eosinophils Absolute 0.0  0.0 - 0.7 K/uL   Basophils Relative 0  0 - 1 %   Basophils Absolute 0.0  0.0 - 0.1 K/uL  COMPREHENSIVE METABOLIC PANEL      Result Value Range   Sodium 132 (*) 135 - 145 mEq/L   Potassium 4.0  3.5 -  5.1 mEq/L   Chloride 97  96 - 112 mEq/L   CO2 22  19 - 32 mEq/L   Glucose, Bld 196 (*) 70 - 99 mg/dL   BUN 28 (*) 6 - 23 mg/dL   Creatinine, Ser 0.86  0.50 - 1.35 mg/dL   Calcium 8.8  8.4 - 57.8 mg/dL   Total Protein 7.6  6.0 - 8.3 g/dL   Albumin 3.4 (*) 3.5 - 5.2 g/dL   AST 17  0 - 37 U/L   ALT 14  0 - 53 U/L   Alkaline Phosphatase 70  39 - 117 U/L   Total Bilirubin 0.8  0.3 - 1.2 mg/dL   GFR calc non Af Amer 60 (*) >90 mL/min   GFR calc Af Amer 69 (*) >90 mL/min  LIPASE, BLOOD      Result Value Range   Lipase 70 (*) 11 - 59 U/L  URINALYSIS, ROUTINE W REFLEX MICROSCOPIC      Result Value Range   Color, Urine YELLOW  YELLOW   APPearance CLEAR  CLEAR   Specific Gravity, Urine 1.022  1.005 - 1.030   pH 5.5  5.0 - 8.0   Glucose, UA 100 (*) NEGATIVE mg/dL   Hgb urine dipstick NEGATIVE  NEGATIVE  Bilirubin Urine NEGATIVE  NEGATIVE   Ketones, ur NEGATIVE  NEGATIVE mg/dL   Protein, ur NEGATIVE  NEGATIVE mg/dL   Urobilinogen, UA 1.0  0.0 - 1.0 mg/dL   Nitrite NEGATIVE  NEGATIVE   Leukocytes, UA NEGATIVE  NEGATIVE  OCCULT BLOOD X 1 CARD TO LAB, STOOL      Result Value Range   Fecal Occult Bld NEGATIVE  NEGATIVE  CG4 I-STAT (LACTIC ACID)      Result Value Range   Lactic Acid, Venous 2.45 (*) 0.5 - 2.2 mmol/L  CG4 I-STAT (LACTIC ACID)      Result Value Range   Lactic Acid, Venous 1.41  0.5 - 2.2 mmol/L      Radiological Exams on Admission: Ct Abdomen Pelvis W Contrast  01/16/2013   *RADIOLOGY REPORT*  Clinical Data: Lower abdominal pain.  Patient diagnosed with multiple myeloma 3 weeks ago and underwent first chemotherapy treatment 4 days ago.  Prior history of diverticulitis.  Surgical history includes inguinal hernia repair.  CT ABDOMEN AND PELVIS WITH CONTRAST  Technique:  Multidetector CT imaging of the abdomen and pelvis was performed following the standard protocol during bolus administration of intravenous contrast.  Contrast: 100 ml Omnipaque-300 IV.  Oral contrast was  also administered.  Comparison: CT abdomen and pelvis 05/08/2012, 08/11/2010.  Findings: Very large stool burden in the rectum and in the tortuous sigmoid colon.  Scattered diverticula involving the descending and sigmoid colon without evidence of acute diverticulitis currently. Upper normal caliber to mildly distended transverse colon and ascending colon containing liquid stool.  Wall thickening involving the proximal descending colon, without evidence of significant colonic wall thickening elsewhere.  Solitary diverticulum arising from the cecum without evidence of acute diverticulitis.  Large hiatal hernia.  Normal appearing small bowel.  No ascites. Appendix not visualized but no pericecal inflammation.  Mild diffuse hepatic steatosis without focal hepatic parenchymal abnormality.  Normal appearing spleen, pancreas, and adrenal glands.  Focus of accessory splenic tissue anterior to the lower pole of the spleen.  Bilateral renal cysts, unchanged from the prior examination.  Approximate 9 mm gallstone within the otherwise normal-appearing gallbladder.  No biliary ductal dilation. Moderate aorto-iliofemoral atherosclerosis without aneurysm.  No significant lymphadenopathy.  Moderate prostate gland enlargement.  Wall thickening involving the dome of the bladder as noted previously, unchanged.  Numerous pelvic phleboliths.  Bilateral inguinal hernia repair with mesh. No evidence of recurrent hernia.  Prior ORIF of a left femoral neck fracture with healing.  Bone window images demonstrate generalized osseous demineralization, compression fractures of T12 and L1 (visualized on the metastatic bone survey 11/24/2012).  Scarring and expected dependent atelectasis in the visualized lower lobes.  Heart enlarged but stable.  IMPRESSION:  1.  Probable rectal and sigmoid colon fecal impaction.  This causes mild distention of the transverse colon, ascending colon, and cecum. 2.  Focal wall thickening involving the proximal  descending colon. This may indicate localized colitis. 3.  No acute abnormalities otherwise involving the abdomen or pelvis. 4.  Cholelithiasis without evidence of acute cholecystitis. 5.  Mild diffuse hepatic steatosis without focal hepatic parenchymal abnormality. 6.  Stable large hiatal hernia. 7.  Stable prostate gland enlargement.  Wall thickening involving the dome of the bladder is unchanged and likely due to chronic outlet obstruction.   Original Report Authenticated By: Hulan Saas, M.D.   Dg Abd Acute W/chest  01/16/2013   *RADIOLOGY REPORT*  Clinical Data: Vomiting and nausea.  Constipation.  ACUTE ABDOMEN SERIES (ABDOMEN 2 VIEW & CHEST  1 VIEW)  Comparison: Bone survey from 11/24/2012.  CT scan from 05/08/2012  Findings: Hyperexpansion is consistent with emphysema. Nodular density overlying the right midlung is stable.  No edema or focal airspace consolidation. The cardiopericardial silhouette is enlarged.  Moderate hiatal hernia noted.  Upright film shows no evidence for intraperitoneal free air. There is no evidence for gaseous bowel dilation to suggest obstruction. Prominent stool noted in the sigmoid colon and rectum.  The patient is status post ventral mesh placement in the lower anterior abdominal wall.  Incomplete visualization of hardware used for right hip fixation.  IMPRESSION: Stable appearance of right mid lung nodule.  CT chest without contrast recommended to further evaluate.  No evidence for bowel perforation or obstruction.   Original Report Authenticated By: Kennith Center, M.D.      Orders placed during the hospital encounter of 01/16/13  . EKG 12-LEAD  . EKG 12-LEAD  . EKG 12-LEAD  . EKG 12-LEAD     Assessment/Plan Principal Problem:   Fecal impaction Active Problems:   RA (rheumatoid arthritis)   GERD (gastroesophageal reflux disease)   Multiple myeloma, without mention of having achieved remission(203.00)   Abdominal pain, unspecified site   Nausea with  vomiting    IgG Lambda MM S/P treatments (Revlimid/Velcade/Decadron/Zometa) this week per Dr Myna Hidalgo  Severe AB pain, constipation/Fecal Impaction/N/V -  Failed mag citrate,  fleets oil enema, small soap suds enema and a dulcolax supp. Also failed manual disimpaction as the stool was too high. He was then given another fleets oil enema per the PA and then irrigated with 1000cc d5w. After 5-10 min he was helped back to the [facility], where he was for about 66min>small amt of brown water ,no stool returned. Cleaned and put back to bed. He then started vomiting again. Linzess is not on formulary.  Will try Go lytely low volume.  Low threshold for NGT if he strts vomiting feces or gets sig bloated.  Will consult GI and may need colon disimpaction.  Dr Jarold Motto is his GI and in 06/2011 did a colonoscopy of which was unremarkable except for diverticulosis. He has a history of previous colon polyps. Endoscopy was also  performed because of chronic GERD, and there was no evidence of Barrett's mucosa on esophageal biopsies, but he did have evidence of Candida esophagitis that was treated with Diflucan therapy. I called and discussed case with GI - will try 2-4 oz of Go Lytley q hr as tolerated and set him up for Gastrograffin enema.  Surgery not currently needed and low risk currently of perforating.  May get worse so we will monitor.  He does not remember his last BM.  mild colitis in proximal descending colon may be incidental finding as his constipation is the issue.  Initial elevated lactic acid is improving with IVF's.  Generalized weakness noted. May need PT/OT/CW  GERD - Oral PPI when tolerating POs - IV for now.  Watch for aspiration.  Zofran for Sx control. Attempt pain control - watch for worsening Sxs if Narcotics are needed.  RA -off MTX.  Lipids - Zocor when tol POs.  HTN on meds.  Monitor  Hopefully moves some bowels and then we can start POs.  DVT Proph  Katrice Goel M 01/16/2013, 11:05  PM

## 2013-01-16 NOTE — ED Notes (Signed)
Pt c/o constipation and lower abd pain. Pt started treatment for myeloma this week. Pt had episode of vomiting after drinking mag citrate.

## 2013-01-16 NOTE — ED Notes (Signed)
Pt requesting water spoke with dr Fredderick Phenix who states to order acute abdomen series, offer his zofran and let him have some ice chips. Ice chips given ask patient if he was nauseated he denies nausea states his lower abdomen feels tight and bloated more uncomfortable that pain. Also states he only vomited after he drank mag citrate and hasnt had any more emesis

## 2013-01-16 NOTE — ED Notes (Addendum)
assigned to room 1304 @ [facility], RN notified, [facility] called for transport.

## 2013-01-16 NOTE — ED Notes (Signed)
MD at bedside. 

## 2013-01-16 NOTE — ED Notes (Signed)
Report given to RN at [facility] 

## 2013-01-16 NOTE — ED Notes (Signed)
Wife gone home

## 2013-01-16 NOTE — ED Provider Notes (Addendum)
Pt is signed out to me to follow up on abd CT.  Pt with colitis in proximal descending colon, no diverticulitis. Initial elevated lactic acid is improving with IVF's.  Enema and disimpaction attempted, and unsuccessful.  Pt has vomited again just now.  Pt with continued abd pain.  Will discuss with Dr. [name] regarding admission to [facility].      [name]. [name], MD 01/16/13 2020     8:46 PM Discussed with Dr. Timothy Lasso who agrees with admission, prefer [facility] as he has h/o MM and followed by oncology as well.    [name]. [name], MD 01/16/13 2046

## 2013-01-16 NOTE — ED Notes (Signed)
Summary of ED visit:brought in by wife  From doctors office for constipation.  Wife states he drank a bottle of mag citrate this am but then vomited.  Complaining of abd pain and nausea..wife state he walks some but weakens easily.  1) was given a fleets oil enema>no results 2) was then given a small soap suds enema and a dulcolax supp.  And then tried to disimpact>the stool was too high.  3)he was then given another fleets oil enema per the PA and then irrigated with 1000cc d5w.  After 5-10 min he was helped back to the [facility], where he was for about 44min>small amt of brown water ,no stool returned.  Cleaned and put back to bed.  He then started vomiting again.  Remains alert but weak.,wife at bedside

## 2013-01-16 NOTE — ED Notes (Signed)
Patient transported to X-ray 

## 2013-01-17 ENCOUNTER — Inpatient Hospital Stay (HOSPITAL_COMMUNITY): Payer: Medicare Other

## 2013-01-17 DIAGNOSIS — K5641 Fecal impaction: Principal | ICD-10-CM

## 2013-01-17 DIAGNOSIS — K5730 Diverticulosis of large intestine without perforation or abscess without bleeding: Secondary | ICD-10-CM

## 2013-01-17 DIAGNOSIS — R109 Unspecified abdominal pain: Secondary | ICD-10-CM

## 2013-01-17 DIAGNOSIS — K5900 Constipation, unspecified: Secondary | ICD-10-CM

## 2013-01-17 LAB — BASIC METABOLIC PANEL
GFR calc Af Amer: 88 mL/min — ABNORMAL LOW (ref >90)
GFR calc non Af Amer: 76 mL/min — ABNORMAL LOW (ref >90)
Potassium: 3.8 mEq/L (ref 3.5–5.1)
Sodium: 130 mEq/L — ABNORMAL LOW (ref 135–145)

## 2013-01-17 LAB — CBC
Hemoglobin: 10.5 g/dL — ABNORMAL LOW (ref 13.0–17.0)
Platelets: 152 10*3/uL (ref 150–400)
RBC: 3.2 MIL/uL — ABNORMAL LOW (ref 4.22–5.81)
WBC: 7.7 10*3/uL (ref 4.0–10.5)

## 2013-01-17 LAB — URINE CULTURE

## 2013-01-17 MED ORDER — SIMVASTATIN 40 MG PO TABS
40.0000 mg | ORAL_TABLET | Freq: Every day | ORAL | Status: DC
  Administered 2013-01-17 – 2013-01-18 (×2): 40 mg via ORAL
  Filled 2013-01-17 (×2): qty 1

## 2013-01-17 MED ORDER — PROCHLORPERAZINE MALEATE 5 MG PO TABS: 5.0000 mg | ORAL_TABLET | Freq: Four times a day (QID) | ORAL | Status: DC | PRN

## 2013-01-17 MED ORDER — METHOTREXATE 2.5 MG PO TABS
7.5000 mg | ORAL_TABLET | ORAL | Status: DC
  Administered 2013-01-17 – 2013-01-18 (×2): 7.5 mg via ORAL
  Filled 2013-01-17 (×2): qty 3

## 2013-01-17 MED ORDER — POLYETHYLENE GLYCOL 3350 17 GM/SCOOP PO POWD
0.5000 | Freq: Once | ORAL | Status: AC
  Administered 2013-01-18: 0.5 via ORAL
  Filled 2013-01-17: qty 255

## 2013-01-17 MED ORDER — ADULT MULTIVITAMIN W/MINERALS CH
1.0000 | ORAL_TABLET | Freq: Every day | ORAL | Status: DC
  Administered 2013-01-17 – 2013-01-18 (×2): 1 via ORAL
  Filled 2013-01-17 (×2): qty 1

## 2013-01-17 MED ORDER — CITALOPRAM HYDROBROMIDE 10 MG PO TABS
10.0000 mg | ORAL_TABLET | Freq: Every day | ORAL | Status: DC
  Administered 2013-01-17 – 2013-01-18 (×2): 10 mg via ORAL
  Filled 2013-01-17 (×2): qty 1

## 2013-01-17 MED ORDER — METOPROLOL SUCCINATE ER 25 MG PO TB24
25.0000 mg | ORAL_TABLET | Freq: Every morning | ORAL | Status: DC
  Administered 2013-01-18: 25 mg via ORAL
  Filled 2013-01-17: qty 1

## 2013-01-17 MED ORDER — SORBITOL 70 % SOLN
960.0000 mL | TOPICAL_OIL | Freq: Three times a day (TID) | ORAL | Status: DC
  Administered 2013-01-17 (×2): 960 mL via RECTAL
  Filled 2013-01-17 (×3): qty 240

## 2013-01-17 MED ORDER — PANTOPRAZOLE SODIUM 20 MG PO TBEC
20.0000 mg | DELAYED_RELEASE_TABLET | Freq: Every day | ORAL | Status: DC
  Administered 2013-01-17 – 2013-01-18 (×2): 20 mg via ORAL
  Filled 2013-01-17 (×2): qty 1

## 2013-01-17 MED ORDER — LENALIDOMIDE 20 MG PO CAPS
20.0000 mg | ORAL_CAPSULE | Freq: Every day | ORAL | Status: DC
  Administered 2013-01-17: 20 mg via ORAL

## 2013-01-17 MED ORDER — TAMSULOSIN HCL 0.4 MG PO CAPS
0.4000 mg | ORAL_CAPSULE | Freq: Every day | ORAL | Status: DC
  Administered 2013-01-17 – 2013-01-18 (×2): 0.4 mg via ORAL
  Filled 2013-01-17 (×2): qty 1

## 2013-01-17 MED ORDER — FAMCICLOVIR 500 MG PO TABS
250.0000 mg | ORAL_TABLET | Freq: Every day | ORAL | Status: DC
Start: 2013-01-17 — End: 2013-01-18
  Administered 2013-01-17: 250 mg via ORAL
  Filled 2013-01-17 (×2): qty 0.5

## 2013-01-17 NOTE — Consult Note (Signed)
Referring Provider: Dr Timothy Lasso Primary Care Physician:  [name], MD Primary Gastroenterologist:  Dr.Patterson  Reason for Consultation:  Fecal Impaction with vomiting  HPI: Shawn Chen is a 79 y.o. male known to Dr. Sheryn Bison from previous procedures. Patient has history of chronic GERD and has a large hiatal hernia. He underwent colonoscopy last in January 2013 which was negative with the exception of left-sided diverticulosis. Outpatient was recently diagnosed with multiple myeloma and underwent his initial chemotherapy infusion on 01/12/2013. He presented to the emergency room last evening with complaints of nausea and vomiting and inability to have a bowel movement. He is unsure home in a days he had gone without a bowel movement. He says he has not been feeling well recently, has been in active and eating less. CT of the abdomen and pelvis done in the emergency room shows a very large burden of stool in the rectum and tortuous sigmoid colon and a probable focal colitis in the proximal descending colon. He has scattered diverticulosis but no evidence of diverticulitis. Several enemas have been tried without much success as yet. He was unable to tolerate a bowel prep due to vomiting and had also vomited mag citrate at home. He denies any significant abdominal discomfort this morning has had some mild cramping. He says he is able to pass flatus and has not had anymore vomiting overnight. A Gastrografin barium enema have been ordered for purposes of evacuating the fecal impaction however is are no longer done through radiology.   Past Medical History  Diagnosis Date  . Diverticulosis of colon (without mention of hemorrhage)   . Esophagitis, unspecified   . 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 Personal history of colonic polyps   . Reflux   . Hernia   . RA (rheumatoid arthritis)   . Myeloma     possiblity of this after last physical, to follow up with  hematology  . Multiple myeloma, without mention of having achieved remission(203.00) 01/05/2013    Past Surgical History  Procedure Laterality Date  . Inguinal hernia repair  03/28/1990    dr price  . Orif hip fracture      Prior to Admission medications   Medication Sig Start Date End Date Taking? Authorizing Provider  citalopram (CELEXA) 10 MG tablet Take 10 mg by mouth daily.  11/12/12   Historical Provider, MD  diltiazem (CARDIZEM CD) 240 MG 24 hr capsule Take 240 mg by mouth daily.  11/01/12   Historical Provider, MD  famciclovir (FAMVIR) 250 MG tablet Take 1 tablet (250 mg total) by mouth daily. 01/05/13   Josph Macho, MD  Lenalidomide 20 MG CAPS Take 20 mg by mouth daily. 01/05/13   Josph Macho, MD  metoprolol (TOPROL-XL) 50 MG 24 hr tablet Take 50 mg by mouth daily. Take 1/2 of a tablet by mouth every day     Historical Provider, MD  Multiple Vitamins-Minerals (SENIOR MULTIVITAMIN PLUS PO) Take by mouth every morning.    Historical Provider, MD  ondansetron (ZOFRAN-ODT) 8 MG disintegrating tablet Take 1 tablet (8 mg total) by mouth every 12 (twelve) hours as needed for nausea. 01/16/13   [name]. [name], MD  pantoprazole (PROTONIX) 20 MG tablet Take 1 tablet (20 mg total) by mouth as needed. 07/28/12   Mardella Layman, MD  polyethylene glycol [facility] E[facility]lene Hal) packet Take 17 g by mouth daily. 01/16/13   [name]. [name], MD  prochlorperazine (COMPAZINE) 5 MG tablet Take 1  tablet (5 mg total) by mouth every 6 (six) hours as needed. 01/05/13   Josph Macho, MD  promethazine (PHENERGAN) 25 MG tablet Take 1 tablet (25 mg total) by mouth every 6 (six) hours as needed for nausea. 01/16/13   [name]. [name], MD  simvastatin (ZOCOR) 40 MG tablet Take 40 mg by mouth daily.      Historical Provider, MD  tamsulosin (FLOMAX) 0.4 MG CAPS Take 0.4 mg by mouth once a week.  11/12/12   Historical Provider, MD    Current Facility-Administered Medications  Medication Dose Route Frequency Provider  Last Rate Last Dose  . 0.9 %  sodium chloride infusion   Intravenous STAT [name], MD 75 mL/hr at 01/16/13 2304    . morphine 2 MG/ML injection 1 mg  1 mg Intravenous Q6H PRN [name], MD      . ondansetron [facility]) injection 4 mg  4 mg Intravenous Q6H PRN [name], MD      . pantoprazole (PROTONIX) injection 40 mg  40 mg Intravenous Q12H [name], MD   40 mg at 01/16/13 2332  . polyethylene glycol-electrolytes (NuLYTELY/GoLYTELY) solution 4,000 mL  4,000 mL Oral Once [name], MD      . sodium chloride 0.9 % bolus 1,000 mL  1,000 mL Intravenous STAT Junius Argyle, MD        Allergies as of 01/16/2013  . (No Known Allergies)    Family History  Problem Relation Age of Onset  . Colon cancer Neg Hx     History   Social History  . Marital Status: Married    Spouse Name: N/A    Number of Children: 2  . Years of Education: N/A   Occupational History  . retired    Social History Main Topics  . Smoking status: Former Smoker    Quit date: 06/18/1990  . Smokeless tobacco: Never Used  . Alcohol Use: No  . Drug Use: No  . Sexually Active: Not on file   Other Topics Concern  . Not on file   Social History Narrative  . No narrative on file    Review of Systems: Pertinent positive and negative review of systems were noted in the above HPI section.  All other review of systems was otherwise negative.  Physical Exam: Vital signs in last 24 hours: Temp:  [97.5 F (36.4 C)-98.7 F (37.1 C)] 98.5 F (36.9 C) (08/02 0615) Pulse Rate:  [60-86] 82 (08/02 0615) Resp:  [16-20] 20 (08/02 0615) BP: (116-140)/(47-74) 136/63 mmHg (08/02 0615) SpO2:  [95 %-100 %] 96 % (08/02 0615) Weight:  [144 lb 14.4 oz (65.726 kg)-145 lb (65.772 kg)] 144 lb 14.4 oz (65.726 kg) (08/01 2310) Last BM Date:  (PTA) General:   Alert,  Well-developed,thin elderly WM pleasant and cooperative in NAD weak appearing Head:  Normocephalic and atraumatic. Eyes:  Sclera clear, no icterus.    Conjunctiva pink. Ears:  Normal auditory acuity. Nose:  No deformity, discharge,  or lesions. Mouth:  No deformity or lesions.   Neck:  Supple; no masses or thyromegaly. Lungs:  Clear throughout to auscultation.   No wheezes, crackles, or rhonchi. Heart:  Regular rate and rhythm; no murmurs, clicks, rubs,  or gallops. Abdomen:  Sof tmildly tender LLQ, BS active,nonpalp mass or hsm.   Rectal:  No external hemorrhoids, impaction palpable at end of examining finger, not rock hard, Msk:  Symmetrical without gross deformities. . Pulses:  Normal pulses noted.  Extremities:  Without clubbing or edema. Neurologic:  Alert and  oriented x4;  grossly normal neurologically. Skin:  Intact without significant lesions or rashes.. Psych:  Alert and cooperative. Normal mood and affect.  Intake/Output from previous day: 08/01 0701 - 08/02 0700 In: -  Out: 700 [Urine:700] Intake/Output this shift:    Lab Results:  Recent Labs  01/16/13 1220 01/17/13 0728  WBC 8.3 7.7  HGB 11.0* 10.5*  HCT 32.4* 30.4*  PLT 176 152   BMET  Recent Labs  01/16/13 1220 01/17/13 0728  NA 132* 130*  K 4.0 3.8  CL 97 97  CO2 22 26  GLUCOSE 196* 130*  BUN 28* 19  CREATININE 1.10 0.91  CALCIUM 8.8 7.4*   LFT  Recent Labs  01/16/13 1220  PROT 7.6  ALBUMIN 3.4*  AST 17  ALT 14  ALKPHOS 70  BILITOT 0.8   PT/INR No results found for this basename: LABPROT, INR,  in the last 72 hours Hepatitis Panel No results found for this basename: HEPBSAG, HCVAB, HEPAIGM, HEPBIGM,  in the last 72 hours    Studies/Results: Abd 1 View (kub)  01/17/2013   *RADIOLOGY REPORT*  Clinical Data:   Fecal impaction.  ABDOMEN - 1 VIEW  Comparison: 01/16/2013  Findings: Nonobstructive bowel gas pattern.  Oral contrast material seen within the right colon and rectum.  Moderate stool burden, particularly in the rectum.  No free air organomegaly.  No acute bony abnormality.  IMPRESSION: Moderate stool burden.  No obstruction.    Original Report Authenticated By: Charlett Nose, M.D.   Ct Abdomen Pelvis W Contrast  01/16/2013   *RADIOLOGY REPORT*  Clinical Data: Lower abdominal pain.  Patient diagnosed with multiple myeloma 3 weeks ago and underwent first chemotherapy treatment 4 days ago.  Prior history of diverticulitis.  Surgical history includes inguinal hernia repair.  CT ABDOMEN AND PELVIS WITH CONTRAST  Technique:  Multidetector CT imaging of the abdomen and pelvis was performed following the standard protocol during bolus administration of intravenous contrast.  Contrast: 100 ml Omnipaque-300 IV.  Oral contrast was also administered.  Comparison: CT abdomen and pelvis 05/08/2012, 08/11/2010.  Findings: Very large stool burden in the rectum and in the tortuous sigmoid colon.  Scattered diverticula involving the descending and sigmoid colon without evidence of acute diverticulitis currently. Upper normal caliber to mildly distended transverse colon and ascending colon containing liquid stool.  Wall thickening involving the proximal descending colon, without evidence of significant colonic wall thickening elsewhere.  Solitary diverticulum arising from the cecum without evidence of acute diverticulitis.  Large hiatal hernia.  Normal appearing small bowel.  No ascites. Appendix not visualized but no pericecal inflammation.  Mild diffuse hepatic steatosis without focal hepatic parenchymal abnormality.  Normal appearing spleen, pancreas, and adrenal glands.  Focus of accessory splenic tissue anterior to the lower pole of the spleen.  Bilateral renal cysts, unchanged from the prior examination.  Approximate 9 mm gallstone within the otherwise normal-appearing gallbladder.  No biliary ductal dilation. Moderate aorto-iliofemoral atherosclerosis without aneurysm.  No significant lymphadenopathy.  Moderate prostate gland enlargement.  Wall thickening involving the dome of the bladder as noted previously, unchanged.  Numerous pelvic phleboliths.   Bilateral inguinal hernia repair with mesh. No evidence of recurrent hernia.  Prior ORIF of a left femoral neck fracture with healing.  Bone window images demonstrate generalized osseous demineralization, compression fractures of T12 and L1 (visualized on the metastatic bone survey 11/24/2012).  Scarring and expected dependent atelectasis in the visualized lower  lobes.  Heart enlarged but stable.  IMPRESSION:  1.  Probable rectal and sigmoid colon fecal impaction.  This causes mild distention of the transverse colon, ascending colon, and cecum. 2.  Focal wall thickening involving the proximal descending colon. This may indicate localized colitis. 3.  No acute abnormalities otherwise involving the abdomen or pelvis. 4.  Cholelithiasis without evidence of acute cholecystitis. 5.  Mild diffuse hepatic steatosis without focal hepatic parenchymal abnormality. 6.  Stable large hiatal hernia. 7.  Stable prostate gland enlargement.  Wall thickening involving the dome of the bladder is unchanged and likely due to chronic outlet obstruction.   Original Report Authenticated By: Hulan Saas, M.D.   Dg Abd Acute W/chest  01/16/2013   *RADIOLOGY REPORT*  Clinical Data: Vomiting and nausea.  Constipation.  ACUTE ABDOMEN SERIES (ABDOMEN 2 VIEW & CHEST 1 VIEW)  Comparison: Bone survey from 11/24/2012.  CT scan from 05/08/2012  Findings: Hyperexpansion is consistent with emphysema. Nodular density overlying the right midlung is stable.  No edema or focal airspace consolidation. The cardiopericardial silhouette is enlarged.  Moderate hiatal hernia noted.  Upright film shows no evidence for intraperitoneal free air. There is no evidence for gaseous bowel dilation to suggest obstruction. Prominent stool noted in the sigmoid colon and rectum.  The patient is status post ventral mesh placement in the lower anterior abdominal wall.  Incomplete visualization of hardware used for right hip fixation.  IMPRESSION: Stable appearance of  right mid lung nodule.  CT chest without contrast recommended to further evaluate.  No evidence for bowel perforation or obstruction.   Original Report Authenticated By: Kennith Center, M.D.    IMPRESSION:  #41 79 year old white male with large fecal impaction involving the rectum and sigmoid colon. #2 new diagnosis of multiple myeloma status post chemotherapy 01/12/2013 #3 chronic GERD with large hiatal hernia  #4 rheumatoid arthritis #5 diverticulosis #6 6 possible focal descending colitis  PLAN: #1 clear liquid diet #2 aggressive efforts with disimpaction and will start smog enemas-we'll probably need several enemas over the next 24 hours #3 will hold off on bowel prep until he is disimpacted then can start MiraLax prep   Amy Esterwood  01/17/2013, 10:45 AM   GI ATTENDING  History, physical, laboratories, x-rays, prior colonoscopy report reviewed. Patient personally seen and examined. Agree with history, physical, assessment, and plans as outlined above. SMOG enema therapy currently underway. We'll follow. Thank you  Wilhemina Bonito. Eda Keys., M.D. [facility] Division of Gastroenterology

## 2013-01-17 NOTE — Progress Notes (Signed)
Patient disimpacted with good results, moderate amount of stool removed. Patient tolerated well.

## 2013-01-17 NOTE — Progress Notes (Signed)
I talked to [place] his nurse who stated he has pooped Large amounts and often. She disimpacted him as well. He feels better/ Restart home meds. Walk. PT.

## 2013-01-17 NOTE — Progress Notes (Signed)
Subjective: F/Up Ab pain, N?V, Impaction, ?Small area of colitis. Discussed case with GI and we gave Go-Lytely overnight which was stopped b/c of Nausea. Gastrograffin Enema was ordered but Radiology called me and cancelled it and said they do not carry gastrograffin anymore and do not do this procedure anymore.  They had no alternatives or suggestions. He still has not pooped. He remains uncomfortable. Pain comes in waves.  Right now no pain.  GI is "grumbling"  Objective: Vital signs in last 24 hours: Temp:  [97.5 F (36.4 C)-98.7 F (37.1 C)] 98.5 F (36.9 C) (08/02 0615) Pulse Rate:  [60-86] 82 (08/02 0615) Resp:  [16-20] 20 (08/02 0615) BP: (116-140)/(47-74) 136/63 mmHg (08/02 0615) SpO2:  [95 %-100 %] 96 % (08/02 0615) Weight:  [65.726 kg (144 lb 14.4 oz)-65.772 kg (145 lb)] 65.726 kg (144 lb 14.4 oz) (08/01 2310) Weight change:  Last BM Date:  (PTA)  CBG (last 3)  No results found for this basename: GLUCAP,  in the last 72 hours  Intake/Output from previous day:  Intake/Output Summary (Last 24 hours) at 01/17/13 0911 Last data filed at 01/17/13 0550  Gross per 24 hour  Intake      0 ml  Output    700 ml  Net   -700 ml   08/01 0701 - 08/02 0700 In: -  Out: 700 [Urine:700]   Physical Exam General appearance: lying still on bed. Alopecia OP - Dry.  Neck: no adenopathy, no carotid bruit, no JVD and thyroid not enlarged, symmetric, no tenderness/mass/nodules  Resp: CTA  Cardio: Reg  GI: softer than expected. Diminished but present BS. min-tender no masses, no organomegaly  Extremities: extremities normal, atraumatic, no cyanosis or edema  Pulses: 2+ and symmetric  Neurologic: Alert and oriented X 3. gen Weakness.    Lab Results:  Recent Labs  01/16/13 1220 01/17/13 0728  NA 132* 130*  K 4.0 3.8  CL 97 97  CO2 22 26  GLUCOSE 196* 130*  BUN 28* 19  CREATININE 1.10 0.91  CALCIUM 8.8 7.4*     Recent Labs  01/16/13 1220  AST 17  ALT 14  ALKPHOS  70  BILITOT 0.8  PROT 7.6  ALBUMIN 3.4*     Recent Labs  01/16/13 1220 01/17/13 0728  WBC 8.3 7.7  NEUTROABS 7.3  --   HGB 11.0* 10.5*  HCT 32.4* 30.4*  MCV 97.6 95.0  PLT 176 152    Lab Results  Component Value Date   INR 1.27 01/21/2010   INR 1.36 01/20/2010   INR 1.32 01/19/2010    No results found for this basename: CKTOTAL, CKMB, CKMBINDEX, TROPONINI,  in the last 72 hours  No results found for this basename: TSH, T4TOTAL, FREET3, T3FREE, THYROIDAB,  in the last 72 hours  No results found for this basename: VITAMINB12, FOLATE, FERRITIN, TIBC, IRON, RETICCTPCT,  in the last 72 hours  Micro Results: No results found for this or any previous visit (from the past 240 hour(s)).   Studies/Results: Abd 1 View (kub)  01/17/2013   *RADIOLOGY REPORT*  Clinical Data:   Fecal impaction.  ABDOMEN - 1 VIEW  Comparison: 01/16/2013  Findings: Nonobstructive bowel gas pattern.  Oral contrast material seen within the right colon and rectum.  Moderate stool burden, particularly in the rectum.  No free air organomegaly.  No acute bony abnormality.  IMPRESSION: Moderate stool burden.  No obstruction.   Original Report Authenticated By: Charlett Nose, M.D.   Ct Abdomen Pelvis  W Contrast  01/16/2013   *RADIOLOGY REPORT*  Clinical Data: Lower abdominal pain.  Patient diagnosed with multiple myeloma 3 weeks ago and underwent first chemotherapy treatment 4 days ago.  Prior history of diverticulitis.  Surgical history includes inguinal hernia repair.  CT ABDOMEN AND PELVIS WITH CONTRAST  Technique:  Multidetector CT imaging of the abdomen and pelvis was performed following the standard protocol during bolus administration of intravenous contrast.  Contrast: 100 ml Omnipaque-300 IV.  Oral contrast was also administered.  Comparison: CT abdomen and pelvis 05/08/2012, 08/11/2010.  Findings: Very large stool burden in the rectum and in the tortuous sigmoid colon.  Scattered diverticula involving the descending  and sigmoid colon without evidence of acute diverticulitis currently. Upper normal caliber to mildly distended transverse colon and ascending colon containing liquid stool.  Wall thickening involving the proximal descending colon, without evidence of significant colonic wall thickening elsewhere.  Solitary diverticulum arising from the cecum without evidence of acute diverticulitis.  Large hiatal hernia.  Normal appearing small bowel.  No ascites. Appendix not visualized but no pericecal inflammation.  Mild diffuse hepatic steatosis without focal hepatic parenchymal abnormality.  Normal appearing spleen, pancreas, and adrenal glands.  Focus of accessory splenic tissue anterior to the lower pole of the spleen.  Bilateral renal cysts, unchanged from the prior examination.  Approximate 9 mm gallstone within the otherwise normal-appearing gallbladder.  No biliary ductal dilation. Moderate aorto-iliofemoral atherosclerosis without aneurysm.  No significant lymphadenopathy.  Moderate prostate gland enlargement.  Wall thickening involving the dome of the bladder as noted previously, unchanged.  Numerous pelvic phleboliths.  Bilateral inguinal hernia repair with mesh. No evidence of recurrent hernia.  Prior ORIF of a left femoral neck fracture with healing.  Bone window images demonstrate generalized osseous demineralization, compression fractures of T12 and L1 (visualized on the metastatic bone survey 11/24/2012).  Scarring and expected dependent atelectasis in the visualized lower lobes.  Heart enlarged but stable.  IMPRESSION:  1.  Probable rectal and sigmoid colon fecal impaction.  This causes mild distention of the transverse colon, ascending colon, and cecum. 2.  Focal wall thickening involving the proximal descending colon. This may indicate localized colitis. 3.  No acute abnormalities otherwise involving the abdomen or pelvis. 4.  Cholelithiasis without evidence of acute cholecystitis. 5.  Mild diffuse hepatic  steatosis without focal hepatic parenchymal abnormality. 6.  Stable large hiatal hernia. 7.  Stable prostate gland enlargement.  Wall thickening involving the dome of the bladder is unchanged and likely due to chronic outlet obstruction.   Original Report Authenticated By: Hulan Saas, M.D.   Dg Abd Acute W/chest  01/16/2013   *RADIOLOGY REPORT*  Clinical Data: Vomiting and nausea.  Constipation.  ACUTE ABDOMEN SERIES (ABDOMEN 2 VIEW & CHEST 1 VIEW)  Comparison: Bone survey from 11/24/2012.  CT scan from 05/08/2012  Findings: Hyperexpansion is consistent with emphysema. Nodular density overlying the right midlung is stable.  No edema or focal airspace consolidation. The cardiopericardial silhouette is enlarged.  Moderate hiatal hernia noted.  Upright film shows no evidence for intraperitoneal free air. There is no evidence for gaseous bowel dilation to suggest obstruction. Prominent stool noted in the sigmoid colon and rectum.  The patient is status post ventral mesh placement in the lower anterior abdominal wall.  Incomplete visualization of hardware used for right hip fixation.  IMPRESSION: Stable appearance of right mid lung nodule.  CT chest without contrast recommended to further evaluate.  No evidence for bowel perforation or obstruction.   Original  Report Authenticated By: Kennith Center, M.D.     Medications: Scheduled: . sodium chloride   Intravenous STAT  . pantoprazole (PROTONIX) IV  40 mg Intravenous Q12H  . polyethylene glycol-electrolytes  4,000 mL Oral Once  . sodium chloride  1,000 mL Intravenous STAT   Continuous:    Assessment/Plan: Principal Problem:   Fecal impaction Active Problems:   RA (rheumatoid arthritis)   GERD (gastroesophageal reflux disease)   Multiple myeloma, without mention of having achieved remission(203.00)   Abdominal pain, unspecified site   Nausea with vomiting  AB pain c discomfort, constipation/Fecal Impaction/N/V - Failed mag citrate, fleets oil  enema, small soap suds enema and a dulcolax supp. Also failed manual disimpaction as the stool was too high. He was then given another fleets oil enema and then irrigated with 1000cc d5w without success. Linzess is not on formulary. Go-Lytely overnight was stopped b/c of Nausea - encouraged to keep trying - slowly. Gastrograffin Enema was ordered but Radiology called me and cancelled it and said they do not carry gastrograffin anymore and do not do this procedure anymore.  They had no alternatives or suggestions. He still has not pooped. i left message with GI for their help. Low threshold for NGT if he starts vomiting feces or gets sig bloated. Will consult GI or surgery prn.  Dr Jarold Motto is his GI and in 06/2011 did a colonoscopy of which was unremarkable except for diverticulosis. He has a history of previous colon polyps. Endoscopy was also performed because of chronic GERD, and there was no evidence of Barrett's mucosa on esophageal biopsies, but he did have evidence of Candida esophagitis that was treated with Diflucan therapy.   mild colitis in proximal descending colon may be incidental finding as his constipation is the issue. Either way supportive care is being provided. Initial elevated lactic acid is improved with IVF's.   IgG Lambda MM S/P treatments (Revlimid/Velcade/Decadron/Zometa) this past week per Dr Myna Hidalgo   Generalized weakness noted. May need PT/OT/CW after he poops  GERD - Oral PPI when tolerating POs - IV for now.   RA -off MTX.   HTN meds on hold. Monitor   Hopefully moves some bowels and then we can re-start POs and get him back to functional.   DVT Proph provided.     LOS: 1 day   Shekita Boyden M 01/17/2013, 9:11 AM

## 2013-01-17 NOTE — Progress Notes (Signed)
SMOG enema administered to patient with small results. Will continue to monitor.

## 2013-01-18 MED ORDER — POLYETHYLENE GLYCOL 3350 17 G PO PACK: 17.0000 g | PACK | Freq: Every day | ORAL | Status: DC

## 2013-01-18 NOTE — Care Management Note (Signed)
    Page 1 of 1   01/18/2013     3:19:25 PM   CARE MANAGEMENT NOTE 01/18/2013  Patient:  AHMANI, PREHN   Account Number:  [id number]  Date Initiated:  01/18/2013  Documentation initiated by:  [facility]  Subjective/Objective Assessment:   ADMITTED W/FECAL IMPACTION.     Action/Plan:   FROM HOME W/SPOUSE.   Anticipated DC Date:  01/18/2013   Anticipated DC Plan:  HOME W HOME HEALTH SERVICES      DC Planning Services  CM consult      Choice offered to / List presented to:  C-1 Patient        HH arranged  HH-2 PT  HH-3 OT      [facility] agency  [facility]   Status of service:  Completed, signed off Medicare Important Message given?   (If response is "NO", the following Medicare IM given date fields will be blank) Date Medicare IM given:   Date Additional Medicare IM given:    Discharge Disposition:  HOME W HOME HEALTH SERVICES  Per UR Regulation:    If discussed at Long Length of Stay Meetings, dates discussed:    Comments:  01/18/13 Jareb Radoncic RN,BSN NCM WEEKEND [phone] [facility] [email or web address].TC MARY REPINFORMED OF HHPT/OT ORDER,& D/C HOME TODAY.

## 2013-01-18 NOTE — Progress Notes (Signed)
Subjective: S/P Many BMs. Appreciate GIs help. He wants to go home. Feeling better  Objective: Vital signs in last 24 hours: Temp:  [97.3 F (36.3 C)-99.4 F (37.4 C)] 97.3 F (36.3 C) (08/03 0613) Pulse Rate:  [67-91] 67 (08/03 0613) Resp:  [16-18] 16 (08/03 0613) BP: (107-136)/(62-71) 107/65 mmHg (08/03 0613) SpO2:  [96 %-98 %] 96 % (08/03 0613) Weight:  [65.772 kg (145 lb)] 65.772 kg (145 lb) (08/03 0613) Weight change: 0 kg (0 lb) Last BM Date: 01/17/13  CBG (last 3)  No results found for this basename: GLUCAP,  in the last 72 hours  Intake/Output from previous day:  Intake/Output Summary (Last 24 hours) at 01/18/13 1048 Last data filed at 01/18/13 1610  Gross per 24 hour  Intake    360 ml  Output    450 ml  Net    -90 ml   08/02 0701 - 08/03 0700 In: 360 [P.O.:360] Out: 750 [Urine:750]   Physical Exam General appearance: lying still on bed. Alopecia Resp: CTA  Cardio: Reg  GI: Back to normal. Extremities: extremities normal, atraumatic, no cyanosis or edema  Pulses: 2+ and symmetric  Neurologic: Alert and oriented X 3. gen Weakness.    Lab Results:  Recent Labs  01/16/13 1220 01/17/13 0728  NA 132* 130*  K 4.0 3.8  CL 97 97  CO2 22 26  GLUCOSE 196* 130*  BUN 28* 19  CREATININE 1.10 0.91  CALCIUM 8.8 7.4*     Recent Labs  01/16/13 1220  AST 17  ALT 14  ALKPHOS 70  BILITOT 0.8  PROT 7.6  ALBUMIN 3.4*     Recent Labs  01/16/13 1220 01/17/13 0728  WBC 8.3 7.7  NEUTROABS 7.3  --   HGB 11.0* 10.5*  HCT 32.4* 30.4*  MCV 97.6 95.0  PLT 176 152    Lab Results  Component Value Date   INR 1.27 01/21/2010   INR 1.36 01/20/2010   INR 1.32 01/19/2010    No results found for this basename: CKTOTAL, CKMB, CKMBINDEX, TROPONINI,  in the last 72 hours  No results found for this basename: TSH, T4TOTAL, FREET3, T3FREE, THYROIDAB,  in the last 72 hours  No results found for this basename: VITAMINB12, FOLATE, FERRITIN, TIBC, IRON,  RETICCTPCT,  in the last 72 hours  Micro Results: Recent Results (from the past 240 hour(s))  URINE CULTURE     Status: None   Collection Time    01/16/13  3:05 PM      Result Value Range Status   Specimen Description URINE, CLEAN CATCH   Final   Special Requests NONE   Final   Culture  Setup Time 01/16/2013 22:41   Final   Colony Count 3,000 COLONIES/ML   Final   Culture INSIGNIFICANT GROWTH   Final   Report Status 01/17/2013 FINAL   Final     Studies/Results: Abd 1 View (kub)  01/17/2013   *RADIOLOGY REPORT*  Clinical Data:   Fecal impaction.  ABDOMEN - 1 VIEW  Comparison: 01/16/2013  Findings: Nonobstructive bowel gas pattern.  Oral contrast material seen within the right colon and rectum.  Moderate stool burden, particularly in the rectum.  No free air organomegaly.  No acute bony abnormality.  IMPRESSION: Moderate stool burden.  No obstruction.   Original Report Authenticated By: Charlett Nose, M.D.   Ct Abdomen Pelvis W Contrast  01/16/2013   *RADIOLOGY REPORT*  Clinical Data: Lower abdominal pain.  Patient diagnosed with multiple myeloma 3 weeks  ago and underwent first chemotherapy treatment 4 days ago.  Prior history of diverticulitis.  Surgical history includes inguinal hernia repair.  CT ABDOMEN AND PELVIS WITH CONTRAST  Technique:  Multidetector CT imaging of the abdomen and pelvis was performed following the standard protocol during bolus administration of intravenous contrast.  Contrast: 100 ml Omnipaque-300 IV.  Oral contrast was also administered.  Comparison: CT abdomen and pelvis 05/08/2012, 08/11/2010.  Findings: Very large stool burden in the rectum and in the tortuous sigmoid colon.  Scattered diverticula involving the descending and sigmoid colon without evidence of acute diverticulitis currently. Upper normal caliber to mildly distended transverse colon and ascending colon containing liquid stool.  Wall thickening involving the proximal descending colon, without evidence of  significant colonic wall thickening elsewhere.  Solitary diverticulum arising from the cecum without evidence of acute diverticulitis.  Large hiatal hernia.  Normal appearing small bowel.  No ascites. Appendix not visualized but no pericecal inflammation.  Mild diffuse hepatic steatosis without focal hepatic parenchymal abnormality.  Normal appearing spleen, pancreas, and adrenal glands.  Focus of accessory splenic tissue anterior to the lower pole of the spleen.  Bilateral renal cysts, unchanged from the prior examination.  Approximate 9 mm gallstone within the otherwise normal-appearing gallbladder.  No biliary ductal dilation. Moderate aorto-iliofemoral atherosclerosis without aneurysm.  No significant lymphadenopathy.  Moderate prostate gland enlargement.  Wall thickening involving the dome of the bladder as noted previously, unchanged.  Numerous pelvic phleboliths.  Bilateral inguinal hernia repair with mesh. No evidence of recurrent hernia.  Prior ORIF of a left femoral neck fracture with healing.  Bone window images demonstrate generalized osseous demineralization, compression fractures of T12 and L1 (visualized on the metastatic bone survey 11/24/2012).  Scarring and expected dependent atelectasis in the visualized lower lobes.  Heart enlarged but stable.  IMPRESSION:  1.  Probable rectal and sigmoid colon fecal impaction.  This causes mild distention of the transverse colon, ascending colon, and cecum. 2.  Focal wall thickening involving the proximal descending colon. This may indicate localized colitis. 3.  No acute abnormalities otherwise involving the abdomen or pelvis. 4.  Cholelithiasis without evidence of acute cholecystitis. 5.  Mild diffuse hepatic steatosis without focal hepatic parenchymal abnormality. 6.  Stable large hiatal hernia. 7.  Stable prostate gland enlargement.  Wall thickening involving the dome of the bladder is unchanged and likely due to chronic outlet obstruction.   Original Report  Authenticated By: Hulan Saas, M.D.   Dg Abd Acute W/chest  01/16/2013   *RADIOLOGY REPORT*  Clinical Data: Vomiting and nausea.  Constipation.  ACUTE ABDOMEN SERIES (ABDOMEN 2 VIEW & CHEST 1 VIEW)  Comparison: Bone survey from 11/24/2012.  CT scan from 05/08/2012  Findings: Hyperexpansion is consistent with emphysema. Nodular density overlying the right midlung is stable.  No edema or focal airspace consolidation. The cardiopericardial silhouette is enlarged.  Moderate hiatal hernia noted.  Upright film shows no evidence for intraperitoneal free air. There is no evidence for gaseous bowel dilation to suggest obstruction. Prominent stool noted in the sigmoid colon and rectum.  The patient is status post ventral mesh placement in the lower anterior abdominal wall.  Incomplete visualization of hardware used for right hip fixation.  IMPRESSION: Stable appearance of right mid lung nodule.  CT chest without contrast recommended to further evaluate.  No evidence for bowel perforation or obstruction.   Original Report Authenticated By: Kennith Center, M.D.     Medications: Scheduled: . citalopram  10 mg Oral Daily  . famciclovir  250 mg Oral QHS  . Lenalidomide  20 mg Oral QHS  . methotrexate  7.5 mg Oral Custom  . metoprolol succinate  25 mg Oral q morning - 10a  . multivitamin with minerals  1 tablet Oral Daily  . pantoprazole  20 mg Oral Daily  . polyethylene glycol-electrolytes  4,000 mL Oral Once  . simvastatin  40 mg Oral Daily  . tamsulosin  0.4 mg Oral Daily   Continuous:    Assessment/Plan: Principal Problem:   Fecal impaction Active Problems:   RA (rheumatoid arthritis)   GERD (gastroesophageal reflux disease)   Multiple myeloma, without mention of having achieved remission(203.00)   Abdominal pain, unspecified site   Nausea with vomiting  AB pain c discomfort, constipation/Fecal Impaction/N/V - Smog Enemas and Manual Disimpaction worked. He is better Recommend miralax daily and  titrate to have 1-2 BMs daily. Will ambulate and D/c today or tomorrow based on strength. He is now on a nml diet and I will d/c IVF. No more Nasuea.  IgG Lambda MM S/P treatments (Revlimid/Velcade/Decadron/Zometa) this past week per Dr Myna Hidalgo.  Resume Plan as directed per Onc.   Generalized weakness noted. Walk and consider PT  HTN  - Watch the cardizem SE of Constipation.  DVT Proph provided.     LOS: 2 days   Tiwanna Tuch M 01/18/2013, 10:48 AM

## 2013-01-18 NOTE — Evaluation (Signed)
Physical Therapy Evaluation Patient Details Name: Shawn Chen MRN: [id number] DOB: 1927/03/12 Today's Date: 01/18/2013 Time: 1191-4782 PT Time Calculation (min): 20 min  PT Assessment / Plan / Recommendation History of Present Illness  86 ma with IgG MM and stared treatments (Revlimid/Velcade/Decadron/Zometa) this week presented to ED today with Severe AB pain, constipation, N/V. Pt tried outpt preparations to help him go and had episode of vomiting after drinking mag citrate. W/up included abd CT which showed colitis in proximal descending colon, no diverticulitis and severe colonic fecal impaction.  Initial elevated lactic acid is improving with IVF's. Enema and disimpaction attempted, and unsuccessful. Pt continued to vomit in ED and I was called for inpt admission - We transferred him from [facility] to assigned to room 1304 @ [facility] via [place]. [facility]: 1) was given a fleets oil enema>no results 2) was then given a small soap suds enema and a dulcolax supp. And then tried to disimpact>the stool was too high. 3)he was then given another fleets oil enema per the PA and then irrigated with 1000cc d5w. After 5-10 min he was helped back to the [facility], where he was for about 76min>small amt of brown water ,no stool returned. Cleaned and put back to bed. He then started vomiting again. Remains alert but weak.  Seen in room 1304 and he is uncomfortable but denies pain at this time.[name]  No CP/SOB  Clinical Impression  Pt tolerated well ambulating but is weak. Pt had loose BM after getting up. Pt will benefit from PT while in acute care. No f/u needs at this time.    PT Assessment  Patient needs continued PT services    Follow Up Recommendations  No PT follow up    Does the patient have the potential to tolerate intense rehabilitation      Barriers to Discharge        Equipment Recommendations  None recommended by PT    Recommendations for Other Services      Frequency Min 3X/week    Precautions / Restrictions Precautions Precautions: Fall   Pertinent Vitals/Pain       Mobility  Bed Mobility Bed Mobility: Supine to Sit Supine to Sit: 7: Independent Transfers Transfers: Sit to Stand;Stand to Sit;Stand Pivot Transfers Sit to Stand: 4: Min guard;From bed;From chair/3-in-1;With upper extremity assist Stand to Sit: 4: Min guard;To chair/3-in-1;With armrests Stand Pivot Transfers: 4: Min guard Details for Transfer Assistance: pt  had urgency for BM upon standing. Ambulation/Gait Ambulation/Gait Assistance: 4: Min guard Ambulation Distance (Feet): 150 Feet Assistive device: Rolling walker Ambulation/Gait Assistance Details: gait is steady and slow, pt relates he has to take it easy. Gait Pattern: Step-through pattern    Exercises     PT Diagnosis: Generalized weakness;Difficulty walking  PT Problem List: Decreased strength;Decreased mobility;Decreased knowledge of use of DME PT Treatment Interventions: DME instruction;Gait training;Functional mobility training     PT Goals(Current goals can be found in the care plan section) Acute Rehab PT Goals Patient Stated Goal: i hope to go home PT Goal Formulation: With patient Time For Goal Achievement: 02/01/13 Potential to Achieve Goals: Good  Visit Information  Last PT Received On: 01/18/13 Assistance Needed: +1 History of Present Illness: 86 ma with IgG MM and stared treatments (Revlimid/Velcade/Decadron/Zometa) this week presented to ED today with Severe AB pain, constipation, N/V. Pt tried outpt preparations to help him go and had episode of vomiting after drinking mag citrate. W/up included abd CT which showed  colitis in proximal descending colon, no diverticulitis and severe colonic fecal impaction.  Initial elevated lactic acid is improving with IVF's. Enema and disimpaction attempted, and unsuccessful. Pt continued to vomit in ED and I was called for inpt admission - We transferred  him from [facility] to assigned to room 1304 @ [facility] via [place]. Per nursing [facility] at [facility]: 1) was given a fleets oil enema>no results 2) was then given a small soap suds enema and a dulcolax supp. And then tried to disimpact>the stool was too high. 3)he was then given another fleets oil enema per the PA and then irrigated with 1000cc d5w. After 5-10 min he was helped back to the [facility], where he was for about 77min>small amt of brown water ,no stool returned. Cleaned and put back to bed. He then started vomiting again. Remains alert but weak.  Seen in room 1304 and he is uncomfortable but denies pain at this time.[name]  No CP/SOB       Prior Functioning  Home Living Family/patient expects to be discharged to:: Private residence Living Arrangements: Spouse/significant other Available Help at Discharge: Family Type of Home: House Home Access: Stairs to enter [employer] of Steps: 3 Entrance Stairs-Rails: None Home Layout: Two level;Able to live on main level with bedroom/bathroom Home Equipment: Dan Humphreys - 2 wheels;Cane - single point;Bedside commode Prior Function Level of Independence: Independent with assistive device(s) Communication Communication: No difficulties    Cognition  Cognition Arousal/Alertness: Awake/alert Behavior During Therapy: WFL for tasks assessed/performed Overall Cognitive Status: Within Functional Limits for tasks assessed    Extremity/Trunk Assessment Upper Extremity Assessment Upper Extremity Assessment: Generalized weakness Lower Extremity Assessment Lower Extremity Assessment: Generalized weakness Cervical / Trunk Assessment Cervical / Trunk Assessment: Normal   Balance    End of Session PT - End of Session Equipment Utilized During Treatment: Gait belt Activity Tolerance: Patient tolerated treatment well Patient left: in chair;with call bell/phone within reach Nurse Communication: Mobility status  GP     Shawn Chen 01/18/2013,  1:16 PM Blanchard Kelch PT [phone]

## 2013-01-18 NOTE — Discharge Summary (Signed)
Physician Discharge Summary  DISCHARGE SUMMARY   Patient ID: Shawn Chen MR#: [id number] DOB/AGE: 10/25/1926 79 y.o.   Attending Physician:Rohnan Bartleson M  Patient's WUJ:WJXB,J Riley Lam, MD  Consults:  GI - Dr Marina Goodell  Admit date: 01/16/2013 Discharge date: 01/18/2013  Discharge Diagnoses:  Principal Problem:   Fecal impaction Active Problems:   RA (rheumatoid arthritis)   GERD (gastroesophageal reflux disease)   Multiple myeloma, without mention of having achieved remission(203.00)   Abdominal pain, unspecified site   Nausea with vomiting   Patient Active Problem List   Diagnosis Date Noted  . Fecal impaction 01/16/2013  . Abdominal pain, unspecified site 01/16/2013  . Nausea with vomiting 01/16/2013  . Multiple myeloma, without mention of having achieved remission(203.00) 01/05/2013  . GERD (gastroesophageal reflux disease) 07/25/2011  . Diverticulosis of colon (without mention of hemorrhage) 07/25/2011  . Hernia, hiatal 07/02/2011  . Candida infection, esophageal 07/02/2011  . Duodenal diverticulum 07/02/2011  . Personal history of colonic polyps 06/26/2011  . Esophageal reflux 06/26/2011  . RA (rheumatoid arthritis) 06/26/2011   Past Medical History  Diagnosis Date  . Diverticulosis of colon (without mention of hemorrhage)   . Esophagitis, unspecified   . 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 Personal history of colonic polyps   . Reflux   . Hernia   . RA (rheumatoid arthritis)   . Myeloma     possiblity of this after last physical, to follow up with hematology  . Multiple myeloma, without mention of having achieved remission(203.00) 01/05/2013    Discharged Condition: Stable   Discharge Medications:   Medication List         alendronate 70 MG tablet  Commonly known as:  FOSAMAX  Take 70 mg by mouth every 7 (seven) days. Take with a full glass of water on an empty stomach.     citalopram 10 MG tablet  Commonly known as:  CELEXA   Take 10 mg by mouth daily.     diltiazem 240 MG 24 hr capsule  Commonly known as:  CARDIZEM CD  Take 240 mg by mouth daily.     famciclovir 500 MG tablet  Commonly known as:  FAMVIR  Take 250 mg by mouth at bedtime.     methotrexate 2.5 MG tablet  Commonly known as:  RHEUMATREX  Take 7.5 mg by mouth 2 (two) times a week. Caution:Chemotherapy. Protect from light.  Saturdays and Sundays.     metoprolol succinate 50 MG 24 hr tablet  Commonly known as:  TOPROL-XL  Take 25 mg by mouth every morning. Take with or immediately following a meal.     multivitamin with minerals Tabs  Take 1 tablet by mouth daily.     ondansetron 8 MG disintegrating tablet  Commonly known as:  ZOFRAN-ODT  Take 1 tablet (8 mg total) by mouth every 12 (twelve) hours as needed for nausea.     pantoprazole 20 MG tablet  Commonly known as:  PROTONIX  Take 20 mg by mouth daily.     polyethylene glycol packet  Commonly known as:  MIRALAX / GLYCOLAX  Take 17 g by mouth daily.     PRESCRIPTION MEDICATION  Inject into the vein every 7 (seven) days. Velcade injection every Monday.     prochlorperazine 5 MG tablet  Commonly known as:  COMPAZINE  Take 5 mg by mouth every 6 (six) hours as needed for nausea.     promethazine 25 MG tablet  Commonly known as:  PHENERGAN  Take 1 tablet (25 mg total) by mouth every 6 (six) hours as needed for nausea.     REVLIMID 20 MG Caps  Generic drug:  Lenalidomide  Take 1 capsule by mouth at bedtime. 21 day cycle, started 01/12/13.     simvastatin 40 MG tablet  Commonly known as:  ZOCOR  Take 40 mg by mouth daily.     tamsulosin 0.4 MG Caps  Commonly known as:  FLOMAX  Take 0.4 mg by mouth daily.        Hospital Procedures: Abd 1 View (kub)  01/17/2013   *RADIOLOGY REPORT*  Clinical Data:   Fecal impaction.  ABDOMEN - 1 VIEW  Comparison: 01/16/2013  Findings: Nonobstructive bowel gas pattern.  Oral contrast material seen within the right colon and rectum.  Moderate  stool burden, particularly in the rectum.  No free air organomegaly.  No acute bony abnormality.  IMPRESSION: Moderate stool burden.  No obstruction.   Original Report Authenticated By: Charlett Nose, M.D.   Ct Abdomen Pelvis W Contrast  01/16/2013   *RADIOLOGY REPORT*  Clinical Data: Lower abdominal pain.  Patient diagnosed with multiple myeloma 3 weeks ago and underwent first chemotherapy treatment 4 days ago.  Prior history of diverticulitis.  Surgical history includes inguinal hernia repair.  CT ABDOMEN AND PELVIS WITH CONTRAST  Technique:  Multidetector CT imaging of the abdomen and pelvis was performed following the standard protocol during bolus administration of intravenous contrast.  Contrast: 100 ml Omnipaque-300 IV.  Oral contrast was also administered.  Comparison: CT abdomen and pelvis 05/08/2012, 08/11/2010.  Findings: Very large stool burden in the rectum and in the tortuous sigmoid colon.  Scattered diverticula involving the descending and sigmoid colon without evidence of acute diverticulitis currently. Upper normal caliber to mildly distended transverse colon and ascending colon containing liquid stool.  Wall thickening involving the proximal descending colon, without evidence of significant colonic wall thickening elsewhere.  Solitary diverticulum arising from the cecum without evidence of acute diverticulitis.  Large hiatal hernia.  Normal appearing small bowel.  No ascites. Appendix not visualized but no pericecal inflammation.  Mild diffuse hepatic steatosis without focal hepatic parenchymal abnormality.  Normal appearing spleen, pancreas, and adrenal glands.  Focus of accessory splenic tissue anterior to the lower pole of the spleen.  Bilateral renal cysts, unchanged from the prior examination.  Approximate 9 mm gallstone within the otherwise normal-appearing gallbladder.  No biliary ductal dilation. Moderate aorto-iliofemoral atherosclerosis without aneurysm.  No significant lymphadenopathy.   Moderate prostate gland enlargement.  Wall thickening involving the dome of the bladder as noted previously, unchanged.  Numerous pelvic phleboliths.  Bilateral inguinal hernia repair with mesh. No evidence of recurrent hernia.  Prior ORIF of a left femoral neck fracture with healing.  Bone window images demonstrate generalized osseous demineralization, compression fractures of T12 and L1 (visualized on the metastatic bone survey 11/24/2012).  Scarring and expected dependent atelectasis in the visualized lower lobes.  Heart enlarged but stable.  IMPRESSION:  1.  Probable rectal and sigmoid colon fecal impaction.  This causes mild distention of the transverse colon, ascending colon, and cecum. 2.  Focal wall thickening involving the proximal descending colon. This may indicate localized colitis. 3.  No acute abnormalities otherwise involving the abdomen or pelvis. 4.  Cholelithiasis without evidence of acute cholecystitis. 5.  Mild diffuse hepatic steatosis without focal hepatic parenchymal abnormality. 6.  Stable large hiatal hernia. 7.  Stable prostate gland enlargement.  Wall thickening involving the dome of the bladder is  unchanged and likely due to chronic outlet obstruction.   Original Report Authenticated By: Hulan Saas, M.D.   Dg Abd Acute W/chest  01/16/2013   *RADIOLOGY REPORT*  Clinical Data: Vomiting and nausea.  Constipation.  ACUTE ABDOMEN SERIES (ABDOMEN 2 VIEW & CHEST 1 VIEW)  Comparison: Bone survey from 11/24/2012.  CT scan from 05/08/2012  Findings: Hyperexpansion is consistent with emphysema. Nodular density overlying the right midlung is stable.  No edema or focal airspace consolidation. The cardiopericardial silhouette is enlarged.  Moderate hiatal hernia noted.  Upright film shows no evidence for intraperitoneal free air. There is no evidence for gaseous bowel dilation to suggest obstruction. Prominent stool noted in the sigmoid colon and rectum.  The patient is status post ventral mesh  placement in the lower anterior abdominal wall.  Incomplete visualization of hardware used for right hip fixation.  IMPRESSION: Stable appearance of right mid lung nodule.  CT chest without contrast recommended to further evaluate.  No evidence for bowel perforation or obstruction.   Original Report Authenticated By: Kennith Center, M.D.    History of Present Illness: 34 ma with IgG MM and started treatments (Revlimid/Velcade/Decadron/Zometa) this past week presented to ED 8/1 with Severe AB pain, constipation, N/V. Pt tried outpt preparations to help him go and had episode of vomiting after drinking mag citrate. W/up in ED included abd CT which showed colitis in proximal descending colon, no diverticulitis and severe colonic fecal impaction. Initial elevated lactic acid iimproved with IVF's. Enema and disimpaction attempted, and unsuccessful. Pt continued to vomit in ED and I was called for inpt admission - We transferred him from [facility] to assigned to room 1304 @ [facility] via [place]. Per [facility] at [facility]: 1) was given a fleets oil enema>no results 2) was then given a small soap suds enema and a dulcolax supp. And then tried to disimpact>the stool was too high. 3)he was then given another fleets oil enema per the PA and then irrigated with 1000cc d5w. After 5-10 min he was helped back to the [facility], where he was for about 65min>small amt of brown water ,no stool returned. Cleaned and put back to bed. He then started vomiting again. Remains alert but weak. Seen in room 1304 and he was uncomfortable but denied pain at that time.[name] No CP/SOB.   Hospital Course: Admitted 8/1 for severe Fecal Impaction c Nausea and vomiting not amenable to manual disempaction or Bowel Prep. I discussed his case with GI and we gave Go-Lytely overnight which was stopped b/c of Nausea.  Gastrograffin Enema was ordered but Radiology called me and cancelled it and said they do not carry gastrograffin anymore and do not do  this procedure anymore. They had no alternatives or suggestions.  He still had not pooped by 8/2 am.  He remained uncomfortable.  Pain came in waves.  GI was consulted and SMOG enema therapy was undertaken. Had huge amt of stool after 2 SMOG enemas yesterday and manual disimpaction, and has been stooling since. Eating breakfast- no nausea., wants to go home. He is looking and feeling much better.  AB pain c discomfort, constipation/Fecal Impaction/N/V - Failed mag citrate, fleets oil enema, small soap suds enema and a dulcolax supp. Also failed manual disimpaction as the stool was too high. He was then given another fleets oil enema and then irrigated with 1000cc d5w without success. Linzess is not on formulary and may be needed as outpt.  Go-Lytely overnight was stopped b/c of Nausea. Gastrograffin  Enema was ordered but Radiology called me and cancelled it and said they do not carry gastrograffin anymore and do not do this procedure anymore.  GI ordered the SMOG Enemas which were successful. Miralax daily and titrate to have 1-2 BMs daily.  Will ambulate and D/c today or tomorrow based on strength.  He is now on a nml diet and I IVFs were d/ced.  No more Nasuea.   Dr Jarold Motto is his GI and in 06/2011 did a colonoscopy of which was unremarkable except for diverticulosis. He has a history of previous colon polyps. Endoscopy was also performed because of chronic GERD, and there was no evidence of Barrett's mucosa on esophageal biopsies, but he did have evidence of Candida esophagitis that was treated with Diflucan therapy.   mild colitis in proximal descending colon may be incidental finding as his constipation is the issue. Either way supportive care was provided.  Initial elevated lactic acid is improved with IVF's.   IgG Lambda MM S/P treatments (Revlimid/Velcade/Decadron/Zometa) this past week per Dr Myna Hidalgo. Resume Plan as directed per Onc.  Generalized weakness noted. Walk and consider PT  HTN  - Watch the cardizem SE of Constipation.  Anemia - follow. Hyponatremia - follow  DVT Proph provided.   I kept him around through the afternoon 01/18/13 and got PT to see him: Pt tolerated well ambulating but is weak. Pt had loose BM after getting up. Pt will benefit from PT while in acute care. He wanted to go home.  Family has access to [employer], Bedside commode, and I will set up HHPT.  Discharge orders and HH Face to Face signed.      Day of Discharge Exam BP 107/65  Pulse 67  Temp(Src) 97.3 F (36.3 C) (Oral)  Resp 16  Ht 5\' 10"  (1.778 m)  Wt 65.772 kg (145 lb)  BMI 20.81 kg/m2  SpO2 96%  Physical Exam: See PN  Discharge Labs:  Recent Labs  01/16/13 1220 01/17/13 0728  NA 132* 130*  K 4.0 3.8  CL 97 97  CO2 22 26  GLUCOSE 196* 130*  BUN 28* 19  CREATININE 1.10 0.91  CALCIUM 8.8 7.4*    Recent Labs  01/16/13 1220  AST 17  ALT 14  ALKPHOS 70  BILITOT 0.8  PROT 7.6  ALBUMIN 3.4*    Recent Labs  01/16/13 1220 01/17/13 0728  WBC 8.3 7.7  NEUTROABS 7.3  --   HGB 11.0* 10.5*  HCT 32.4* 30.4*  MCV 97.6 95.0  PLT 176 152   No results found for this basename: CKTOTAL, CKMB, CKMBINDEX, TROPONINI,  in the last 72 hours No results found for this basename: TSH, T4TOTAL, FREET3, T3FREE, THYROIDAB,  in the last 72 hours No results found for this basename: VITAMINB12, FOLATE, FERRITIN, TIBC, IRON, RETICCTPCT,  in the last 72 hours Lab Results  Component Value Date   INR 1.27 01/21/2010   INR 1.36 01/20/2010   INR 1.32 01/19/2010       Discharge instructions:      Future Appointments Provider Department Dept Phone   01/19/2013 10:30 AM Sharlotte Alamo [facility] [phone]   01/19/2013 11:00 AM [facility] Chair 4 [place] [facility] [phone]   01/26/2013 9:15 AM Rachael Fee [facility] [phone]   01/26/2013 9:30 AM [facility] Chair 1 [place] [facility] [phone]   02/09/2013 9:00 AM Gwendolyn A  [facility] [phone]   02/09/2013 9:30 AM Josph Macho, MD [facility] [phone]   02/09/2013 10:15 AM [facility] Chair 3 [place] [facility] [phone]   02/17/2013 10:30 AM Rachael Fee [place] [facility] [phone]   02/17/2013 11:00 AM [facility] Bed 1 [place] [facility] [phone]   02/23/2013 9:30 AM Rachael Fee [place] [facility] [phone]   02/23/2013 10:00 AM [facility] Chair 3 [place] [facility] [phone]   03/02/2013 9:00 AM Rachael Fee [facility] [phone]   03/02/2013 9:30 AM [facility] Chair 2 [place] [facility] [phone]   03/16/2013 8:15 AM Rachael Fee [facility] [phone]   03/16/2013 8:45 AM Josph Macho, MD [place] [facility] [phone]   03/16/2013 9:30 AM [facility] Chair 8  [facility] [phone]     01-Home or Self Care Follow-up Information   Follow up with [name], MD In 8 days.   Contact information:   [address] [facility] [place] [address] [phone]        Disposition: home  Follow-up Appts: Follow-up with Dr. [name] at [facility] in 8 days.  Call for appointment.  Condition on Discharge: stable  Tests Needing Follow-up: CBC/BMET. ?need for linzess on top of Miralax  Time spent in discharge (includes decision making & examination of pt): 31 min.  Signed: [name] 01/18/2013, 10:57 AM

## 2013-01-18 NOTE — Progress Notes (Signed)
Patient ID: Shawn Chen, male   DOB: [birth year], 79 y.o.   MRN: [id number] [facility] Progress Note  Subjective: Looking and feeling much better today. Had huge amt of stool after 2 SMOG enemas yesterday, and has been stooling since. Eating breakfast- no nausea., wants to go home. Objective:  Vital signs in last 24 hours: Temp:  [97.3 F (36.3 C)-99.4 F (37.4 C)] 97.3 F (36.3 C) (08/03 4098) Pulse Rate:  [67-91] 67 (08/03 0613) Resp:  [16-18] 16 (08/03 0613) BP: (107-136)/(62-71) 107/65 mmHg (08/03 0613) SpO2:  [96 %-98 %] 96 % (08/03 1191) Weight:  [145 lb (65.772 kg)] 145 lb (65.772 kg) (08/03 4782) Last BM Date: 01/17/13 General:   Alert,  Well-developed, elderly WM    in NAD Heart:  Regular rate and rhythm; no murmurs Pulm;clear  Abdomen:  Soft, nontender and nondistended. Normal bowel sounds, without guarding, and without rebound.   Extremities:  Without edema. Neurologic:  Alert and  oriented x4;  grossly normal neurologically. Psych:  Alert and cooperative. Normal mood and affect.  Intake/Output from previous day: 08/02 0701 - 08/03 0700 In: 360 [P.O.:360] Out: 750 [Urine:750] Intake/Output this shift:    Lab Results:  Recent Labs  01/16/13 1220 01/17/13 0728  WBC 8.3 7.7  HGB 11.0* 10.5*  HCT 32.4* 30.4*  PLT 176 152   BMET  Recent Labs  01/16/13 1220 01/17/13 0728  NA 132* 130*  K 4.0 3.8  CL 97 97  CO2 22 26  GLUCOSE 196* 130*  BUN 28* 19  CREATININE 1.10 0.91  CALCIUM 8.8 7.4*   LFT  Recent Labs  01/16/13 1220  PROT 7.6  ALBUMIN 3.4*  AST 17  ALT 14  ALKPHOS 70  BILITOT 0.8      Assessment / Plan: #1  79 yo male with resolved fecal impaction - obstructive sxs resolved as well. Ok for discharge home from GI standpoint- would leave him on daily Miralax. Principal Problem:   Fecal impaction Active Problems:   RA (rheumatoid arthritis)   GERD (gastroesophageal reflux disease)   Multiple myeloma, without  mention of having achieved remission(203.00)   Abdominal pain, unspecified site   Nausea with vomiting     LOS: 2 days   Amy Esterwood  01/18/2013, 9:59 AM  GI ATTENDING  Agree with above. Impaction resolved. Needs home bowel regimen. Recommend miralax daily and titrate to have 1-2 BMs daily. Resume general medical care with Dr. Timothy Lasso. Call if further assistance needed. Thank you.  Wilhemina Bonito. Eda Keys., M.D. [facility] Division of Gastroenterology

## 2013-01-18 NOTE — Progress Notes (Signed)
Patient discharged to home with family, discharge instructions reviewed with patient and wife who verbalized understanding.  

## 2013-01-19 ENCOUNTER — Telehealth: Payer: Self-pay | Admitting: Hematology & Oncology

## 2013-01-19 ENCOUNTER — Ambulatory Visit: Payer: Medicare Other

## 2013-01-19 ENCOUNTER — Other Ambulatory Visit: Payer: Medicare Other | Admitting: Lab

## 2013-01-19 NOTE — Telephone Encounter (Signed)
Wife called said pt got out of hospital yesterday and Dr. [name] suggested he not get tx today. Per Dr. Myna Hidalgo ok to cx for today. Pt aware to come for 8-11 appointment. I added chemo to October.

## 2013-01-22 ENCOUNTER — Encounter (HOSPITAL_COMMUNITY): Payer: Self-pay | Admitting: Emergency Medicine

## 2013-01-22 ENCOUNTER — Emergency Department (HOSPITAL_COMMUNITY): Payer: Medicare Other

## 2013-01-22 ENCOUNTER — Inpatient Hospital Stay (HOSPITAL_COMMUNITY)
Admission: EM | Admit: 2013-01-22 | Discharge: 2013-01-26 | DRG: 840 | Disposition: A | Discharge status: Skilled Nursing Facility | Payer: Medicare Other | Attending: Internal Medicine | Admitting: Internal Medicine

## 2013-01-22 DIAGNOSIS — D6481 Anemia due to antineoplastic chemotherapy: Secondary | ICD-10-CM | POA: Diagnosis present

## 2013-01-22 DIAGNOSIS — C9000 Multiple myeloma not having achieved remission: Principal | ICD-10-CM | POA: Diagnosis present

## 2013-01-22 DIAGNOSIS — E43 Unspecified severe protein-calorie malnutrition: Secondary | ICD-10-CM | POA: Diagnosis present

## 2013-01-22 DIAGNOSIS — I951 Orthostatic hypotension: Secondary | ICD-10-CM

## 2013-01-22 DIAGNOSIS — D469 Myelodysplastic syndrome, unspecified: Secondary | ICD-10-CM | POA: Diagnosis present

## 2013-01-22 DIAGNOSIS — K5900 Constipation, unspecified: Secondary | ICD-10-CM | POA: Diagnosis present

## 2013-01-22 DIAGNOSIS — I959 Hypotension, unspecified: Secondary | ICD-10-CM | POA: Diagnosis present

## 2013-01-22 DIAGNOSIS — I10 Essential (primary) hypertension: Secondary | ICD-10-CM | POA: Diagnosis present

## 2013-01-22 DIAGNOSIS — D649 Anemia, unspecified: Secondary | ICD-10-CM

## 2013-01-22 DIAGNOSIS — Z8719 Personal history of other diseases of the digestive system: Secondary | ICD-10-CM

## 2013-01-22 DIAGNOSIS — M069 Rheumatoid arthritis, unspecified: Secondary | ICD-10-CM | POA: Diagnosis present

## 2013-01-22 DIAGNOSIS — R531 Weakness: Secondary | ICD-10-CM | POA: Diagnosis present

## 2013-01-22 DIAGNOSIS — Z9221 Personal history of antineoplastic chemotherapy: Secondary | ICD-10-CM

## 2013-01-22 DIAGNOSIS — T451X5A Adverse effect of antineoplastic and immunosuppressive drugs, initial encounter: Secondary | ICD-10-CM | POA: Diagnosis present

## 2013-01-22 DIAGNOSIS — R627 Adult failure to thrive: Secondary | ICD-10-CM | POA: Diagnosis present

## 2013-01-22 DIAGNOSIS — F329 Major depressive disorder, single episode, unspecified: Secondary | ICD-10-CM | POA: Diagnosis present

## 2013-01-22 DIAGNOSIS — K5641 Fecal impaction: Secondary | ICD-10-CM | POA: Diagnosis present

## 2013-01-22 DIAGNOSIS — IMO0002 Reserved for concepts with insufficient information to code with codable children: Secondary | ICD-10-CM

## 2013-01-22 DIAGNOSIS — Z79899 Other long term (current) drug therapy: Secondary | ICD-10-CM

## 2013-01-22 DIAGNOSIS — E860 Dehydration: Secondary | ICD-10-CM | POA: Diagnosis present

## 2013-01-22 DIAGNOSIS — D72819 Decreased white blood cell count, unspecified: Secondary | ICD-10-CM | POA: Diagnosis present

## 2013-01-22 DIAGNOSIS — E871 Hypo-osmolality and hyponatremia: Secondary | ICD-10-CM | POA: Diagnosis present

## 2013-01-22 DIAGNOSIS — E876 Hypokalemia: Secondary | ICD-10-CM | POA: Diagnosis present

## 2013-01-22 DIAGNOSIS — K219 Gastro-esophageal reflux disease without esophagitis: Secondary | ICD-10-CM | POA: Diagnosis present

## 2013-01-22 DIAGNOSIS — R197 Diarrhea, unspecified: Secondary | ICD-10-CM | POA: Diagnosis not present

## 2013-01-22 HISTORY — DX: Traumatic subdural hemorrhage with loss of consciousness of unspecified duration, initial encounter: S06.5X9A

## 2013-01-22 LAB — URINALYSIS, ROUTINE W REFLEX MICROSCOPIC
Glucose, UA: NEGATIVE mg/dL
Leukocytes, UA: NEGATIVE
Nitrite: NEGATIVE
Specific Gravity, Urine: 1.011 (ref 1.005–1.030)
pH: 7.5 (ref 5.0–8.0)

## 2013-01-22 LAB — COMPREHENSIVE METABOLIC PANEL
AST: 15 U/L (ref 0–37)
Albumin: 2.5 g/dL — ABNORMAL LOW (ref 3.5–5.2)
Alkaline Phosphatase: 61 U/L (ref 39–117)
BUN: 9 mg/dL (ref 6–23)
CO2: 24 mEq/L (ref 19–32)
Chloride: 99 mEq/L (ref 96–112)
Potassium: 3.4 mEq/L — ABNORMAL LOW (ref 3.5–5.1)
Total Bilirubin: 1.1 mg/dL (ref 0.3–1.2)

## 2013-01-22 LAB — CBC WITH DIFFERENTIAL/PLATELET
Basophils Relative: 0 % (ref 0–1)
Hemoglobin: 9 g/dL — ABNORMAL LOW (ref 13.0–17.0)
Lymphocytes Relative: 13 % (ref 12–46)
MCHC: 34.2 g/dL (ref 30.0–36.0)
Monocytes Relative: 12 % (ref 3–12)
Neutro Abs: 3.1 10*3/uL (ref 1.7–7.7)
Neutrophils Relative %: 74 % (ref 43–77)
RBC: 2.79 MIL/uL — ABNORMAL LOW (ref 4.22–5.81)
WBC: 4.2 10*3/uL (ref 4.0–10.5)

## 2013-01-22 LAB — SAMPLE TO BLOOD BANK

## 2013-01-22 LAB — PROTIME-INR: Prothrombin Time: 15.4 seconds — ABNORMAL HIGH (ref 11.6–15.2)

## 2013-01-22 LAB — TROPONIN I: Troponin I: <0.3 ng/mL (ref <0.30)

## 2013-01-22 MED ORDER — SODIUM CHLORIDE 0.9 % IV SOLN: 1000.0000 mL | INTRAVENOUS | Status: DC

## 2013-01-22 MED ORDER — DEXTROSE-NACL 5-0.45 % IV SOLN
INTRAVENOUS | Status: AC
  Administered 2013-01-22: 16:00:00 via INTRAVENOUS

## 2013-01-22 MED ORDER — KCL IN DEXTROSE-NACL 20-5-0.9 MEQ/L-%-% IV SOLN
INTRAVENOUS | Status: DC
  Administered 2013-01-22 – 2013-01-24 (×3): via INTRAVENOUS
  Filled 2013-01-22 (×8): qty 1000

## 2013-01-22 MED ORDER — TAMSULOSIN HCL 0.4 MG PO CAPS
0.4000 mg | ORAL_CAPSULE | Freq: Every day | ORAL | Status: DC
  Administered 2013-01-23 – 2013-01-26 (×4): 0.4 mg via ORAL
  Filled 2013-01-22 (×4): qty 1

## 2013-01-22 MED ORDER — ENOXAPARIN SODIUM 40 MG/0.4ML ~~LOC~~ SOLN
40.0000 mg | SUBCUTANEOUS | Status: DC
  Administered 2013-01-22 – 2013-01-26 (×5): 40 mg via SUBCUTANEOUS
  Filled 2013-01-22 (×5): qty 0.4

## 2013-01-22 MED ORDER — FAMCICLOVIR 500 MG PO TABS
250.0000 mg | ORAL_TABLET | Freq: Every day | ORAL | Status: DC
  Administered 2013-01-22 – 2013-01-23 (×2): 250 mg via ORAL
  Administered 2013-01-24: 22:00:00 via ORAL
  Administered 2013-01-25 – 2013-01-26 (×2): 250 mg via ORAL
  Filled 2013-01-22 (×5): qty 0.5

## 2013-01-22 MED ORDER — POLYETHYLENE GLYCOL 3350 17 G PO PACK
17.0000 g | PACK | Freq: Every day | ORAL | Status: DC
  Administered 2013-01-23: 17 g via ORAL
  Filled 2013-01-22 (×3): qty 1

## 2013-01-22 MED ORDER — SODIUM CHLORIDE 0.9 % IV SOLN
1000.0000 mL | Freq: Once | INTRAVENOUS | Status: AC
  Administered 2013-01-22: 1000 mL via INTRAVENOUS

## 2013-01-22 MED ORDER — ONDANSETRON HCL 4 MG/2ML IJ SOLN: 4.0000 mg | Freq: Four times a day (QID) | INTRAMUSCULAR | Status: DC | PRN

## 2013-01-22 MED ORDER — ACETAMINOPHEN 650 MG RE SUPP: 650.0000 mg | Freq: Four times a day (QID) | RECTAL | Status: DC | PRN

## 2013-01-22 MED ORDER — ONDANSETRON HCL 4 MG PO TABS: 4.0000 mg | ORAL_TABLET | Freq: Four times a day (QID) | ORAL | Status: DC | PRN

## 2013-01-22 MED ORDER — CITALOPRAM HYDROBROMIDE 10 MG PO TABS
10.0000 mg | ORAL_TABLET | Freq: Every day | ORAL | Status: DC
  Administered 2013-01-23 – 2013-01-26 (×4): 10 mg via ORAL
  Filled 2013-01-22 (×4): qty 1

## 2013-01-22 MED ORDER — PANTOPRAZOLE SODIUM 20 MG PO TBEC
20.0000 mg | DELAYED_RELEASE_TABLET | Freq: Every day | ORAL | Status: DC
  Administered 2013-01-23 – 2013-01-26 (×4): 20 mg via ORAL
  Filled 2013-01-22 (×4): qty 1

## 2013-01-22 MED ORDER — ACETAMINOPHEN 325 MG PO TABS
650.0000 mg | ORAL_TABLET | Freq: Four times a day (QID) | ORAL | Status: DC | PRN
  Administered 2013-01-25 – 2013-01-26 (×3): 650 mg via ORAL
  Filled 2013-01-22 (×3): qty 2

## 2013-01-22 MED ORDER — ADULT MULTIVITAMIN W/MINERALS CH
1.0000 | ORAL_TABLET | Freq: Every day | ORAL | Status: DC
  Administered 2013-01-23 – 2013-01-26 (×4): 1 via ORAL
  Filled 2013-01-22 (×4): qty 1

## 2013-01-22 MED ORDER — ASPIRIN EC 81 MG PO TBEC
162.0000 mg | DELAYED_RELEASE_TABLET | Freq: Every day | ORAL | Status: DC
  Administered 2013-01-23 – 2013-01-26 (×4): 162 mg via ORAL
  Filled 2013-01-22 (×4): qty 2

## 2013-01-22 MED ORDER — SENNA 8.6 MG PO TABS
1.0000 | ORAL_TABLET | Freq: Two times a day (BID) | ORAL | Status: DC
  Administered 2013-01-22 – 2013-01-24 (×4): 8.6 mg via ORAL
  Filled 2013-01-22 (×4): qty 1

## 2013-01-22 NOTE — ED Notes (Signed)
Pt has multiple myloma.  D/c from hospital on Sunday and has been complaining of weakness. Wife states pt's weakness got worse last night. Had controlled slide to floor last night and has had polyuria since last night as well.

## 2013-01-22 NOTE — Progress Notes (Signed)
Patient declines activation of MyChart at this time 

## 2013-01-22 NOTE — H&P (Addendum)
Shawn Chen is an 79 y.o. male.   PCP:   [name], Chen   Chief Complaint:  Weakness, No BM since D/C, AFTT  HPI: 37 male whom I know well from last weekend when I admitted and Rxed him c Fecal Impaction/Ab Pain/N/V S/P Success c SMOG Enema.  I tried to keep him around and get therapy b/c i thought he was weak.  He saw PT and we set up HHPT and he wanted to go home.  He has known IgG MM and started treatments c Revlimid/Velcade/ Decadron/Zometa last week and Rxs this week on hold.   He was in ED this am and was admitted c generalized Weakness, Lightheaded, AFTT, No BM despite Laxatives, DeH, Anorexia, hypocalcemia, Orthostasis, and hypotension.  Denies N/V/Bloody stools Ab PAin, fevers, or CP/SOB or other issues. Wife states pt's weakness got worse last night. Had controlled slide to floor last night and has had polyuria since last night as well. Patient woke up this morning he was too weak to stand     Past Medical History:  Past Medical History  Diagnosis Date  . Diverticulosis of colon (without mention of hemorrhage)   . Esophagitis, unspecified   . 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 Personal history of colonic polyps   . Reflux   . Hernia   . RA (rheumatoid arthritis)   . Myeloma     possiblity of this after last physical, to follow up with hematology  . Multiple myeloma, without mention of having achieved remission(203.00) 01/05/2013    Past Surgical History  Procedure Laterality Date  . Inguinal hernia repair  03/28/1990    dr price  . Orif hip fracture        Allergies:  No Known Allergies   Medications: Prior to Admission medications   Medication Sig Start Date End Date Taking? Authorizing Provider  alendronate (FOSAMAX) 70 MG tablet Take 70 mg by mouth every 7 (seven) days. Take with a full glass of water on an empty stomach.   Yes Historical Provider, Chen  aspirin EC 81 MG tablet Take 162 mg by mouth daily.   Yes Historical Provider,  Chen  citalopram (CELEXA) 10 MG tablet Take 10 mg by mouth daily.  11/12/12  Yes Historical Provider, Chen  diltiazem (CARDIZEM CD) 240 MG 24 hr capsule Take 240 mg by mouth daily.  11/01/12  Yes Historical Provider, Chen  famciclovir (FAMVIR) 500 MG tablet Take 250 mg by mouth at bedtime.   Yes Historical Provider, Chen  Lenalidomide (REVLIMID) 20 MG CAPS Take 1 capsule by mouth at bedtime. 21 day cycle, started 01/12/13.   Yes Historical Provider, Chen  methotrexate (RHEUMATREX) 2.5 MG tablet Take 7.5 mg by mouth 2 (two) times a week. Caution:Chemotherapy. Protect from light.  Saturdays and Sundays.   Yes Historical Provider, Chen  metoprolol succinate (TOPROL-XL) 50 MG 24 hr tablet Take 25 mg by mouth every morning. Take with or immediately following a meal.   Yes Historical Provider, Chen  Multiple Vitamin (MULTIVITAMIN WITH MINERALS) TABS Take 1 tablet by mouth daily.   Yes Historical Provider, Chen  ondansetron (ZOFRAN-ODT) 8 MG disintegrating tablet Take 1 tablet (8 mg total) by mouth every 12 (twelve) hours as needed for nausea. 01/16/13  Yes Shawn Chen. Shawn Chen  pantoprazole (PROTONIX) 20 MG tablet Take 20 mg by mouth daily.   Yes Historical Provider, Chen  polyethylene glycol (MIRALAX / GLYCOLAX) packet Take 17 g by mouth daily. 01/18/13  Yes  Shawn Pounds, Chen  PRESCRIPTION MEDICATION Inject into the vein every 7 (seven) days. Velcade injection every Monday.   Yes Historical Provider, Chen  prochlorperazine (COMPAZINE) 5 MG tablet Take 5 mg by mouth every 6 (six) hours as needed for nausea.   Yes Historical Provider, Chen  promethazine (PHENERGAN) 25 MG tablet Take 1 tablet (25 mg total) by mouth every 6 (six) hours as needed for nausea. 01/16/13  Yes Shawn Chen. Shawn Chen  simvastatin (ZOCOR) 40 MG tablet Take 40 mg by mouth daily.     Yes Historical Provider, Chen  tamsulosin (FLOMAX) 0.4 MG CAPS Take 0.4 mg by mouth daily.   Yes Historical Provider, Chen     Medications Prior to Admission  Medication Sig Dispense Refill   . alendronate (FOSAMAX) 70 MG tablet Take 70 mg by mouth every 7 (seven) days. Take with a full glass of water on an empty stomach.      [name] aspirin EC 81 MG tablet Take 162 mg by mouth daily.      . citalopram (CELEXA) 10 MG tablet Take 10 mg by mouth daily.       [name] diltiazem (CARDIZEM CD) 240 MG 24 hr capsule Take 240 mg by mouth daily.       . famciclovir (FAMVIR) 500 MG tablet Take 250 mg by mouth at bedtime.      . Lenalidomide (REVLIMID) 20 MG CAPS Take 1 capsule by mouth at bedtime. 21 day cycle, started 01/12/13.      . methotrexate (RHEUMATREX) 2.5 MG tablet Take 7.5 mg by mouth 2 (two) times a week. Caution:Chemotherapy. Protect from light.  Saturdays and Sundays.      . metoprolol succinate (TOPROL-XL) 50 MG 24 hr tablet Take 25 mg by mouth every morning. Take with or immediately following a meal.      . Multiple Vitamin (MULTIVITAMIN WITH MINERALS) TABS Take 1 tablet by mouth daily.      . ondansetron (ZOFRAN-ODT) 8 MG disintegrating tablet Take 1 tablet (8 mg total) by mouth every 12 (twelve) hours as needed for nausea.  20 tablet  0  . pantoprazole (PROTONIX) 20 MG tablet Take 20 mg by mouth daily.      . polyethylene glycol (MIRALAX / GLYCOLAX) packet Take 17 g by mouth daily.  5 each  0  . PRESCRIPTION MEDICATION Inject into the vein every 7 (seven) days. Velcade injection every Monday.      . prochlorperazine (COMPAZINE) 5 MG tablet Take 5 mg by mouth every 6 (six) hours as needed for nausea.      . promethazine (PHENERGAN) 25 MG tablet Take 1 tablet (25 mg total) by mouth every 6 (six) hours as needed for nausea.  20 tablet  0  . simvastatin (ZOCOR) 40 MG tablet Take 40 mg by mouth daily.        . tamsulosin (FLOMAX) 0.4 MG CAPS Take 0.4 mg by mouth daily.         Social History:  reports that he quit smoking about 22 years ago. He has never used smokeless tobacco. He reports that he does not drink alcohol or use illicit drugs.  Family History: Family History  Problem  Relation Age of Onset  . Colon cancer Neg Hx     Review of Systems:  Review of Systems - See HPI.  Full ROS obtained and O/W (-)  Physical Exam:  Blood pressure 110/61, pulse 63, temperature 97.7 F (36.5 C), temperature source Oral, resp. rate 20,  height 5\' 10"  (1.778 m), weight 60.646 kg (133 lb 11.2 oz), SpO2 99.00%. Filed Vitals:   01/22/13 1308 01/22/13 1310 01/22/13 1318 01/22/13 1439  BP: 117/63 107/51 99/49 110/61  Pulse: 58 69 67 63  Temp:    97.7 F (36.5 C)  TempSrc:    Oral  Resp: 19 21  20   Height:   5\' 10"  (1.778 m)   Weight:   60.646 kg (133 lb 11.2 oz)   SpO2: 97%   99%   General appearance: lying still on bed. Alopecia.  Less animated than Sunday  OP - Dry.  Neck: no adenopathy, no carotid bruit, no JVD and thyroid not enlarged, symmetric, no tenderness/mass/nodules  Resp: CTA  Cardio: Reg  GI: soft. Diminished but present BS. min-tender no masses, no organomegaly  Extremities: extremities normal, atraumatic, no cyanosis or edema  Pulses: 2+ and symmetric  Neurologic: Alert and oriented X 3. gen Weakness nothing Focal.   Labs on Admission:   Recent Labs  01/22/13 1056  NA 131*  K 3.4*  CL 99  CO2 24  GLUCOSE 113*  BUN 9  CREATININE 0.90  CALCIUM 7.0*    Recent Labs  01/22/13 1056  AST 15  ALT 18  ALKPHOS 61  BILITOT 1.1  PROT 6.4  ALBUMIN 2.5*   No results found for this basename: LIPASE, AMYLASE,  in the last 72 hours  Recent Labs  01/22/13 1056  WBC 4.2  NEUTROABS 3.1  HGB 9.0*  HCT 26.3*  MCV 94.3  PLT 146*    Recent Labs  01/22/13 1056  TROPONINI <0.30   Lab Results  Component Value Date   INR 1.25 01/22/2013   INR 1.27 01/21/2010   INR 1.36 01/20/2010     LAB RESULT POCT:  Results for orders placed during the hospital encounter of 01/22/13  URINALYSIS, ROUTINE W REFLEX MICROSCOPIC      Result Value Range   Color, Urine YELLOW  YELLOW   APPearance CLEAR  CLEAR   Specific Gravity, Urine 1.011  1.005 - 1.030   pH  7.5  5.0 - 8.0   Glucose, UA NEGATIVE  NEGATIVE mg/dL   Hgb urine dipstick NEGATIVE  NEGATIVE   Bilirubin Urine NEGATIVE  NEGATIVE   Ketones, ur NEGATIVE  NEGATIVE mg/dL   Protein, ur NEGATIVE  NEGATIVE mg/dL   Urobilinogen, UA 1.0  0.0 - 1.0 mg/dL   Nitrite NEGATIVE  NEGATIVE   Leukocytes, UA NEGATIVE  NEGATIVE  CBC WITH DIFFERENTIAL      Result Value Range   WBC 4.2  4.0 - 10.5 K/uL   RBC 2.79 (*) 4.22 - 5.81 MIL/uL   Hemoglobin 9.0 (*) 13.0 - 17.0 g/dL   HCT 16.1 (*) 09.6 - 04.5 %   MCV 94.3  78.0 - 100.0 fL   MCH 32.3  26.0 - 34.0 pg   MCHC 34.2  30.0 - 36.0 g/dL   RDW 40.9  81.1 - 91.4 %   Platelets 146 (*) 150 - 400 K/uL   Neutrophils Relative % 74  43 - 77 %   Neutro Abs 3.1  1.7 - 7.7 K/uL   Lymphocytes Relative 13  12 - 46 %   Lymphs Abs 0.6 (*) 0.7 - 4.0 K/uL   Monocytes Relative 12  3 - 12 %   Monocytes Absolute 0.5  0.1 - 1.0 K/uL   Eosinophils Relative 1  0 - 5 %   Eosinophils Absolute 0.0  0.0 - 0.7 K/uL  Basophils Relative 0  0 - 1 %   Basophils Absolute 0.0  0.0 - 0.1 K/uL  COMPREHENSIVE METABOLIC PANEL      Result Value Range   Sodium 131 (*) 135 - 145 mEq/L   Potassium 3.4 (*) 3.5 - 5.1 mEq/L   Chloride 99  96 - 112 mEq/L   CO2 24  19 - 32 mEq/L   Glucose, Bld 113 (*) 70 - 99 mg/dL   BUN 9  6 - 23 mg/dL   Creatinine, Ser 1.61  0.50 - 1.35 mg/dL   Calcium 7.0 (*) 8.4 - 10.5 mg/dL   Total Protein 6.4  6.0 - 8.3 g/dL   Albumin 2.5 (*) 3.5 - 5.2 g/dL   AST 15  0 - 37 U/L   ALT 18  0 - 53 U/L   Alkaline Phosphatase 61  39 - 117 U/L   Total Bilirubin 1.1  0.3 - 1.2 mg/dL   GFR calc non Af Amer 76 (*) >90 mL/min   GFR calc Af Amer 88 (*) >90 mL/min  PROTIME-INR      Result Value Range   Prothrombin Time 15.4 (*) 11.6 - 15.2 seconds   INR 1.25  0.00 - 1.49  TROPONIN I      Result Value Range   Troponin I <0.30  <0.30 ng/mL  SAMPLE TO BLOOD BANK      Result Value Range   Blood Bank Specimen SAMPLE AVAILABLE FOR TESTING     Sample Expiration  01/25/2013        Radiological Exams on Admission: Dg Abd Acute W/chest  01/22/2013   *RADIOLOGY REPORT*  Clinical Data: Nausea vomiting weakness, fatigue  ACUTE ABDOMEN SERIES (ABDOMEN 2 VIEW & CHEST 1 VIEW)  Comparison: 01/17/2013  Findings: Chest:  Cardiac enlargement without heart failure or edema.  Negative for pneumonia or effusion. Hiatal hernia noted.  Abdomen:  Normal bowel gas pattern.  Negative for bowel obstruction or free air.  Mild scoliosis.  Mild fracture T12 and L1.  These appear chronic.  IMPRESSION: No active cardiopulmonary disease.  Normal bowel gas pattern.   Original Report Authenticated By: [name], M.D.      Orders placed during the hospital encounter of 01/22/13  . EKG 12-LEAD  . EKG 12-LEAD  . ED EKG  . ED EKG     Assessment/Plan Active Problems:   RA (rheumatoid arthritis)   GERD (gastroesophageal reflux disease)   Multiple myeloma, without mention of having achieved remission(203.00)   Fecal impaction   Dehydration   Generalized weakness   Unspecified constipation   Protein-calorie malnutrition, severe    generalized Weakness/Lightheaded/AFTT/DeH/Anorexia/hypocalcemia/Orthostasis, and hypotension.  Admit (He meets inpatient criteria), Hydrate, Get Therapy, consider SNF, Onc to consult, Adjust meds, Check Cortisol levels = P.  Follow Labs.  S/P recent Fecal Impaction c Nausea/vomiting/Ab pain - resolved post SMOG enema Linzess is not on formulary and may be needed as outpt. He may be constipated again as No BM since Sunday.  KUB showed nml Bowel Gas pattern. Go-Lytely will be attempted again. Continue nml diet and I IVFs were d/ced.  Dr Jarold Motto is his GI and in 06/2011 did a colonoscopy of which was unremarkable except for diverticulosis. He has a history of previous colon polyps. Endoscopy was also performed because of chronic GERD, and there was no evidence of Barrett's mucosa on esophageal biopsies, but he did have evidence of Candida  esophagitis that was treated with Diflucan therapy.    IgG Lambda MM S/P treatments (  Revlimid/Velcade/Decadron/Zometa) this past week per Dr Myna Hidalgo. - CHEMO was placed on hold this week.  Will ask Dr Tama Gander team to see pt as well.  His Chemo has to be causing some of his Sxs. I placed Revlimid and MTX on hold.  Generalized weakness noted. PT/OT/CW.  RA - Hold MTX  HTN - Stop cardizem and BB and monitor BP and HR.  Anemia - Hbg 9.0 down from 10.5.  Doubt GIB.  Follow.  Transfuse if necessary.  Probably CHEMO induced Hyponatremia - following and stable at 131.   Protein cal Malnutrition c Alb 2.5. Nutrition consult DVT Proph   Full code for now.   Marielle Mantione M 01/22/2013, 7:05 PM

## 2013-01-22 NOTE — ED Provider Notes (Signed)
CSN: [id number]     Arrival date & time 01/22/13  1015 History  First MD Initiated Contact with Patient 01/22/13 1025     Chief Complaint  Patient presents with  . Fatigue    HPI Patient presents emergent complaints of generalized weakness. Patient has a history of multiple myeloma and was recently in the hospital for a fecal impaction associated with colitis. Patient was treated in the hospital with IV fluids and a couple gastrointestinal interventions to help relieve his impaction.  This ultimately resolved with enemas. Patient was released from hospital yesterday. He has not had a bowel movement since that time. Last night he is attempting to go to the bathroom and felt very weak. He slid to the ground. The family called the EMS to assist him back in the bed. Patient woke up this morning he was too weak to stand. He has not had any vomiting. He denies any abdominal pain. He denies any fevers. He has noticed increased urinary frequency. Past Medical History  Diagnosis Date  . Diverticulosis of colon (without mention of hemorrhage)   . Esophagitis, unspecified   . 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 Personal history of colonic polyps   . Reflux   . Hernia   . RA (rheumatoid arthritis)   . Myeloma     possiblity of this after last physical, to follow up with hematology  . Multiple myeloma, without mention of having achieved remission(203.00) 01/05/2013   Past Surgical History  Procedure Laterality Date  . Inguinal hernia repair  03/28/1990    dr price  . Orif hip fracture     Family History  Problem Relation Age of Onset  . Colon cancer Neg Hx    History  Substance Use Topics  . Smoking status: Former Smoker    Quit date: 06/18/1990  . Smokeless tobacco: Never Used  . Alcohol Use: No    Review of Systems  All other systems reviewed and are negative.    Allergies  Review of patient's allergies indicates no known allergies.  Home Medications   Current  Outpatient Rx  Name  Route  Sig  Dispense  Refill  . alendronate (FOSAMAX) 70 MG tablet   Oral   Take 70 mg by mouth every 7 (seven) days. Take with a full glass of water on an empty stomach.         [name] aspirin EC 81 MG tablet   Oral   Take 162 mg by mouth daily.         . citalopram (CELEXA) 10 MG tablet   Oral   Take 10 mg by mouth daily.          [name] diltiazem (CARDIZEM CD) 240 MG 24 hr capsule   Oral   Take 240 mg by mouth daily.          . famciclovir (FAMVIR) 500 MG tablet   Oral   Take 250 mg by mouth at bedtime.         . Lenalidomide (REVLIMID) 20 MG CAPS   Oral   Take 1 capsule by mouth at bedtime. 21 day cycle, started 01/12/13.         . methotrexate (RHEUMATREX) 2.5 MG tablet   Oral   Take 7.5 mg by mouth 2 (two) times a week. Caution:Chemotherapy. Protect from light.  Saturdays and Sundays.         . metoprolol succinate (TOPROL-XL) 50 MG 24 hr tablet   Oral  Take 25 mg by mouth every morning. Take with or immediately following a meal.         . Multiple Vitamin (MULTIVITAMIN WITH MINERALS) TABS   Oral   Take 1 tablet by mouth daily.         . ondansetron (ZOFRAN-ODT) 8 MG disintegrating tablet   Oral   Take 1 tablet (8 mg total) by mouth every 12 (twelve) hours as needed for nausea.   20 tablet   0   . pantoprazole (PROTONIX) 20 MG tablet   Oral   Take 20 mg by mouth daily.         . polyethylene glycol (MIRALAX / GLYCOLAX) packet   Oral   Take 17 g by mouth daily.   5 each   0   . PRESCRIPTION MEDICATION   Intravenous   Inject into the vein every 7 (seven) days. Velcade injection every Monday.         . prochlorperazine (COMPAZINE) 5 MG tablet   Oral   Take 5 mg by mouth every 6 (six) hours as needed for nausea.         . promethazine (PHENERGAN) 25 MG tablet   Oral   Take 1 tablet (25 mg total) by mouth every 6 (six) hours as needed for nausea.   20 tablet   0   . simvastatin (ZOCOR) 40 MG tablet   Oral   Take  40 mg by mouth daily.           . tamsulosin (FLOMAX) 0.4 MG CAPS   Oral   Take 0.4 mg by mouth daily.          BP 99/49  Pulse 67  Temp(Src) 98.6 F (37 C) (Oral)  Resp 21  SpO2 97% Physical Exam  Nursing note and vitals reviewed. Constitutional: He appears listless. No distress.  Elderly, frail  HENT:  Head: Normocephalic and atraumatic.  Right Ear: External ear normal.  Left Ear: External ear normal.  Mouth/Throat: Mucous membranes are dry.  Eyes: Conjunctivae are normal. Right eye exhibits no discharge. Left eye exhibits no discharge. No scleral icterus.  Neck: Neck supple. No tracheal deviation present.  Cardiovascular: Normal rate, regular rhythm and intact distal pulses.   Pulmonary/Chest: Effort normal and breath sounds normal. No stridor. No respiratory distress. He has no wheezes. He has no rales.  Abdominal: Soft. Bowel sounds are normal. He exhibits no distension. There is no tenderness. There is no rebound and no guarding.  Musculoskeletal: He exhibits no edema and no tenderness.  Neurological: He appears listless. No sensory deficit. Cranial nerve deficit:  no gross defecits noted. He exhibits normal muscle tone. He displays no seizure activity. Coordination normal.  Generalized weakness, patient stable lift both arms off bed, he is able to lift both legs off the bed, patient is unable to sit up on his own or stand  Skin: Skin is warm and dry. No rash noted.  Psychiatric: He has a normal mood and affect.    ED Course  EKG Normal sinus rhythm rate 60 Premature ventricular complex Right bundle branch block and left posterior fascicular block Normal st t waves When compared to prior EKG dated 01/16/2013 no significant change noted  Procedures (including critical care time)  Labs Reviewed  CBC WITH DIFFERENTIAL - Abnormal; Notable for the following:    RBC 2.79 (*)    Hemoglobin 9.0 (*)    HCT 26.3 (*)    Platelets 146 (*)    Lymphs  Abs 0.6 (*)    All  other components within normal limits  COMPREHENSIVE METABOLIC PANEL - Abnormal; Notable for the following:    Sodium 131 (*)    Potassium 3.4 (*)    Glucose, Bld 113 (*)    Calcium 7.0 (*)    Albumin 2.5 (*)    GFR calc non Af Amer 76 (*)    GFR calc Af Amer 88 (*)    All other components within normal limits  PROTIME-INR - Abnormal; Notable for the following:    Prothrombin Time 15.4 (*)    All other components within normal limits  URINALYSIS, ROUTINE W REFLEX MICROSCOPIC  TROPONIN I  SAMPLE TO BLOOD BANK   Dg Abd Acute W/chest  01/22/2013   *RADIOLOGY REPORT*  Clinical Data: Nausea vomiting weakness, fatigue  ACUTE ABDOMEN SERIES (ABDOMEN 2 VIEW & CHEST 1 VIEW)  Comparison: 01/17/2013  Findings: Chest:  Cardiac enlargement without heart failure or edema.  Negative for pneumonia or effusion. Hiatal hernia noted.  Abdomen:  Normal bowel gas pattern.  Negative for bowel obstruction or free air.  Mild scoliosis.  Mild fracture T12 and L1.  These appear chronic.  IMPRESSION: No active cardiopulmonary disease.  Normal bowel gas pattern.   Original Report Authenticated By: [name], M.D.   1. Weakness   2. Anemia   3. Orthostatic hypotension   4. Hypocalcemia     MDM  Patient does not have evidence of acute infection at this time. There is no evidence of recurrent severe constipation or bowel obstruction. The patient presents with severe weakness. He was given IV fluids however when he tried to stand up he became very weak and lightheaded again. Patient was orthostatic. He does have one point decrease in his hemoglobin. He is also hypocalcemic.Stool will be tested for blood.  I will consult with his physician regarding admission for IV hydration, evaluation and monitoring.   Celene Kras, MD 01/22/13 1328

## 2013-01-23 DIAGNOSIS — R5383 Other fatigue: Secondary | ICD-10-CM

## 2013-01-23 DIAGNOSIS — C9000 Multiple myeloma not having achieved remission: Principal | ICD-10-CM

## 2013-01-23 DIAGNOSIS — D649 Anemia, unspecified: Secondary | ICD-10-CM

## 2013-01-23 LAB — CORTISOL: Cortisol, Plasma: 13.5 ug/dL

## 2013-01-23 LAB — PREPARE RBC (CROSSMATCH)

## 2013-01-23 LAB — BASIC METABOLIC PANEL
Chloride: 101 mEq/L (ref 96–112)
Creatinine, Ser: 0.93 mg/dL (ref 0.50–1.35)
GFR calc Af Amer: 87 mL/min — ABNORMAL LOW (ref >90)
GFR calc non Af Amer: 75 mL/min — ABNORMAL LOW (ref >90)
Potassium: 3.3 mEq/L — ABNORMAL LOW (ref 3.5–5.1)

## 2013-01-23 LAB — CBC
HCT: 23.4 % — ABNORMAL LOW (ref 39.0–52.0)
MCHC: 34.2 g/dL (ref 30.0–36.0)
RDW: 15 % (ref 11.5–15.5)
WBC: 5 10*3/uL (ref 4.0–10.5)

## 2013-01-23 MED ORDER — ENSURE COMPLETE PO LIQD
237.0000 mL | Freq: Two times a day (BID) | ORAL | Status: DC
  Administered 2013-01-23 – 2013-01-26 (×6): 237 mL via ORAL

## 2013-01-23 MED ORDER — FUROSEMIDE 10 MG/ML IJ SOLN
10.0000 mg | Freq: Once | INTRAMUSCULAR | Status: AC
  Administered 2013-01-23: 10 mg via INTRAVENOUS
  Filled 2013-01-23: qty 1

## 2013-01-23 MED ORDER — FOLIC ACID 1 MG PO TABS
2.0000 mg | ORAL_TABLET | Freq: Every day | ORAL | Status: DC
  Administered 2013-01-23 – 2013-01-26 (×4): 2 mg via ORAL
  Filled 2013-01-23 (×4): qty 2

## 2013-01-23 MED ORDER — ACETAMINOPHEN 325 MG PO TABS
650.0000 mg | ORAL_TABLET | Freq: Once | ORAL | Status: AC
  Administered 2013-01-23: 650 mg via ORAL
  Filled 2013-01-23: qty 2

## 2013-01-23 MED ORDER — POTASSIUM CHLORIDE CRYS ER 20 MEQ PO TBCR
40.0000 meq | EXTENDED_RELEASE_TABLET | Freq: Once | ORAL | Status: AC
  Administered 2013-01-23: 40 meq via ORAL
  Filled 2013-01-23: qty 2

## 2013-01-23 NOTE — Consult Note (Signed)
NAMEMarland Chen  SOTERO, BRINKMEYER NO.:  [id number]  MEDICAL RECORD NO.:  [id number]  LOCATION:  1505                         FACILITY:  [facility]  PHYSICIAN:  Josph Macho, M.D.  DATE OF BIRTH:  [birth year]  DATE OF CONSULTATION:  01/23/2013 DATE OF DISCHARGE:                                CONSULTATION   REFERRING PHYSICIAN:  [name], MD  REASON FOR CONSULTATION: 1. Multiple myeloma. 2. Weakness/failure to thrive.  HISTORY OF PRESENT ILLNESS:  Mr. Reineck is a nice 79 year old white gentleman.  He is a well known to me.  I initially saw him back in early July.  At that point in time, he was found to have an IgG lambda myeloma.  He also had some anemia and leukopenia.  Has rheumatoid arthritis.  He is on methotrexate for this.  He did have a bone marrow biopsy done.  This was done on December 23, 2012. The report showed that he had a hypercellular marrow with 17% plasma cells.  I had this erythropoiesis.  He had ringed sideroblasts.  There is 15% ringed sideroblasts.  He did have cytogenetics done.  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  He did have a testosterone level done.  This was okay at 365.  An erythropoietin level was done which was 35.  He has been admitted a couple of times over the past week or so. Protonix for constipation.  Now, he is admitted for failure to thrive.  On admission, he had a lab work which showed white cell count 4.2, hemoglobin 9, hematocrit 26, platelet count 146.  His total protein was 6.4 with an albumin of 2.5.  Calcium was 7.0.  Renal function was okay.  He had a chest x-ray and he had abdominal x-ray done.  He had normal gas pattern.  Chest x-ray done showed some cardiac enlargement without heart failure.  He was admitted by Dr. Timothy Lasso.  We were asked to see him and try to help with management issues.  PAST MEDICAL HISTORY: 1. Remarkable for rheumatoid arthritis. 2.  Diverticulosis. 3. Hip fracture with repair.  ALLERGIES:  None.  MEDICATIONS:  Admission medications were: 1. Fosamax 70 mg q.week. 2. Aspirin 162 mg p.o. daily. 3. Celexa 10 mg p.o. daily. 4. Cardizem CD 240 mg p.o. daily. 5. Famvir 250 mg p.o. q.h.s. 6. Revlimid 20 mg p.o. daily. 7. Methotrexate 7.5 mg p.o. twice a week. 8. Toprol-XL 25 mg p.o. daily. 9. Protonix 20 mg p.o. daily. 10.MiraLax 17 g daily. 11.Zocor 40 mg p.o. daily. 12.Flomax 0.4 mg p.o. daily.  SOCIAL HISTORY:  Remarkable for past tobacco use.  He stopped 22 years ago.  There is no significant alcohol use.  FAMILY HISTORY:  Noncontributory.  REVIEW OF SYSTEMS:  As stated in history of present illness.  PHYSICAL EXAMINATION:  General:  This is an elderly, somewhat frail- appearing white gentleman, in no obvious distress. VITAL SIGNS:  Temperature of 98.2, pulse 64, respiratory rate 21, blood pressure 123/69. HEAD AND NECK:  Some temporal muscle wasting.  He has no intraoral lesions.  He has no adenopathy in the neck.  He has pale  conjunctivae. LUNGS:  Relatively clear bilaterally.  There are no rales, wheezes, or rhonchi. CARDIAC:  Regular rate and rhythm with a normal S1, S2.  He has no murmurs, rubs, or bruits. ABDOMEN:  Soft.  He has good bowel sounds.  There is no fluid wave. There is no palpable hepatosplenomegaly. EXTREMITIES:  Some muscle atrophy in the upper and lower extremities. NEUROLOGIC:  No focal neurological deficits. SKIN:  No rashes, ecchymosis, or petechia.  LABORATORY DATA:  White cell count is 5, hemoglobin 8, hematocrit 23.4, platelet count 143.  IMPRESSION:  Mr. Cordaro is an 79 year old gentleman with IgG lambda myeloma.  He is on Velcade and Revlimid.  Unfortunately, I believe that he just is not going to tolerate this treatment.  It is really hard to say if his current state of affairs is because of his treatments.  I do believe, however, that his myeloma is not going to kill  him.  I thought that we could treat the myeloma to try to improve his blood counts.  I do think, however, that he may have a bigger issue with his bone marrow.  I suspect that he probably has significant myelodysplasia.  The bone marrow does show some red blood cell abnormalities which would go along with myelodysplasia.  There is some increased ringed sideroblasts.  I think he is going to need to be transfused.  His hemoglobin is 8 today.  Given his age and other health problems, I think that his anemia is symptomatic.  I need to keep in mind that he is on methotrexate for the rheumatoid arthritis.  We probably need to get him on folic acid.  This may help a little bit.  I suspect that we may need to consider ESA administration for his anemia.  Again, his erythropoietin level is not that high and I suspect that he just is not getting "signal" to get his bone marrow going and making red cells.  I talked to Mr. Baggerly this morning.  I told him what I thought regarding treating his myeloma.  He certainly has no problems, not have any treatment for the myeloma.  Again, we will transfuse him.  I will check some iron studies on him.  Also, he is on some folic acid.  We will follow along.  I greatly appreciate the wonderful care that he is receiving by Dr. Timothy Lasso by the wonderful staff up [address].     Josph Macho, M.D.     PRE/MEDQ  D:  01/23/2013  T:  01/23/2013  Job:  [id number]

## 2013-01-23 NOTE — Progress Notes (Signed)
Subjective: Re-admitted last night c profound generalized weakness due to AFTT, ?Revlimid vrs the cancer in general, Anorexia, Worsening Sxatic Anemia,  DeH on IVF, And constipation.  Soft BP and Cardizem and BB held.  Stopping the cardizem may help with the constipation as well. No BMs overnight. Only ate the Veggies c last nights dinner. No New C/o  Objective: Vital signs in last 24 hours: Temp:  [97.7 F (36.5 C)-98.6 F (37 C)] 98.2 F (36.8 C) (08/08 0551) Pulse Rate:  [58-69] 64 (08/08 0551) Resp:  [16-24] 21 (08/08 0551) BP: (99-123)/(49-69) 123/69 mmHg (08/08 0551) SpO2:  [95 %-99 %] 98 % (08/08 0551) Weight:  [60.646 kg (133 lb 11.2 oz)] 60.646 kg (133 lb 11.2 oz) (08/07 1318) Weight change:  Last BM Date:  (PTA, has been several days)  CBG (last 3)  No results found for this basename: GLUCAP,  in the last 72 hours  Intake/Output from previous day:  Intake/Output Summary (Last 24 hours) at 01/23/13 0622 Last data filed at 01/23/13 0554  Gross per 24 hour  Intake      0 ml  Output    425 ml  Net   -425 ml   08/07 0701 - 08/08 0700 In: -  Out: 425 [Urine:425]   Physical Exam General appearance: in bed.  Evry move is slow and deliberate.  Weak and frail. OP - Dry.  Neck: no adenopathy, no carotid bruit, no JVD and thyroid not enlarged, symmetric, no tenderness/mass/nodules  Resp: CTA  Cardio: Reg  GI: soft, Diminished but present BS. min-tender no masses, no organomegaly  Extremities: extremities normal, atraumatic, no cyanosis or edema  Pulses: 2+ and symmetric  Lymph nodes: No cervical lymphadenopathy  Neurologic: Alert and oriented X 3. gen Weakness.   Lab Results:  Recent Labs  01/22/13 1056 01/23/13 0445  NA 131* 130*  K 3.4* 3.3*  CL 99 101  CO2 24 22  GLUCOSE 113* 126*  BUN 9 10  CREATININE 0.90 0.93  CALCIUM 7.0* 6.9*     Recent Labs  01/22/13 1056  AST 15  ALT 18  ALKPHOS 61  BILITOT 1.1  PROT 6.4  ALBUMIN 2.5*     Recent  Labs  01/22/13 1056 01/23/13 0445  WBC 4.2 5.0  NEUTROABS 3.1  --   HGB 9.0* 8.0*  HCT 26.3* 23.4*  MCV 94.3 94.4  PLT 146* 143*    Lab Results  Component Value Date   INR 1.25 01/22/2013   INR 1.27 01/21/2010   INR 1.36 01/20/2010     Recent Labs  01/22/13 1056  TROPONINI <0.30    No results found for this basename: TSH, T4TOTAL, FREET3, T3FREE, THYROIDAB,  in the last 72 hours  No results found for this basename: VITAMINB12, FOLATE, FERRITIN, TIBC, IRON, RETICCTPCT,  in the last 72 hours  Micro Results: Recent Results (from the past 240 hour(s))  URINE CULTURE     Status: None   Collection Time    01/16/13  3:05 PM      Result Value Range Status   Specimen Description URINE, CLEAN CATCH   Final   Special Requests NONE   Final   Culture  Setup Time 01/16/2013 22:41   Final   Colony Count 3,000 COLONIES/ML   Final   Culture INSIGNIFICANT GROWTH   Final   Report Status 01/17/2013 FINAL   Final     Studies/Results: Dg Abd Acute W/chest  01/22/2013   *RADIOLOGY REPORT*  Clinical Data: Nausea vomiting  weakness, fatigue  ACUTE ABDOMEN SERIES (ABDOMEN 2 VIEW & CHEST 1 VIEW)  Comparison: 01/17/2013  Findings: Chest:  Cardiac enlargement without heart failure or edema.  Negative for pneumonia or effusion. Hiatal hernia noted.  Abdomen:  Normal bowel gas pattern.  Negative for bowel obstruction or free air.  Mild scoliosis.  Mild fracture T12 and L1.  These appear chronic.  IMPRESSION: No active cardiopulmonary disease.  Normal bowel gas pattern.   Original Report Authenticated By: Shawn Chen, Chen.D.     Medications: Scheduled: . aspirin EC  162 mg Oral Daily  . citalopram  10 mg Oral Daily  . enoxaparin (LOVENOX) injection  40 mg Subcutaneous Q24H  . famciclovir  250 mg Oral QHS  . multivitamin with minerals  1 tablet Oral Daily  . pantoprazole  20 mg Oral Daily  . polyethylene glycol  17 g Oral Daily  . senna  1 tablet Oral BID  . tamsulosin  0.4 mg Oral Daily    Continuous: . dextrose 5 % and 0.9 % NaCl with KCl 20 mEq/L 75 mL/hr at 01/22/13 2027     Assessment/Plan: Active Problems:   RA (rheumatoid arthritis)   GERD (gastroesophageal reflux disease)   Multiple myeloma, without mention of having achieved remission(203.00)   Fecal impaction   Dehydration   Generalized weakness   Unspecified constipation   Protein-calorie malnutrition, severe  generalized Weakness/Lightheaded/AFTT/DeH/Anorexia/hypocalcemia - corrects to 8.1/Orthostasis, and hypotension. Continue hydration. PT/OT on order. Consider SNF, Onc to consult as the Revilmid may not be tolerated.  His meds have been reduced and many are held.  Random Cortisol was 13.5 and appropriate.  Not Adrenal insuff.  Constipation - Bowel Prep.  No need for enema at this time.  Consider Go-Lytely if needed.  IgG Lambda MM S/P treatments (Revlimid/Velcade/Decadron/Zometa)- Dr Shawn Chen was "assigned" on EPIC.  I discussed c Onc and they said this was the best way to order an inpt consult.  His Chemo has to be causing some of his Sxs. I placed Revlimid and MTX (for his RA) on hold.   Generalized weakness- PT/OT/CW.  RA - Hold MTX  HTN - Stop cardizem and BB and monitor BP and HR - they are fine this am.  Anemia - Hbg 8.0 down from 9-10.5. Doubt GIB. Follow. Type and cross and give 1 unit.  He is very weak and Sxatic - ? How much the anemia is playing a role. Probably CHEMO induced   Hyponatremia - following and stable at 130-131.  Hypokalemia - replete Protein cal Malnutrition c Alb 2.5. Nutrition consult. On D5 fluids for the little bit of calories it is providing. DVT Proph  Full code for now.   LOS: 1 day   Shawn Chen 01/23/2013, 6:22 AM

## 2013-01-23 NOTE — Progress Notes (Signed)
Pt has new junctional cardiac rhythm. Did EKG & notified Dr. Timothy Lasso.  Dr. Timothy Lasso stated that he stopped cardizem and beta blocker d/t soft BP.  He stated that this will cause changes in his rhythm.  We will continue to monitor pt.

## 2013-01-23 NOTE — Evaluation (Signed)
Occupational Therapy Evaluation Patient Details Name: Shawn Chen MRN: [id number] DOB: 12-04-1926 Today's Date: 01/23/2013 Time: 4098-1191 OT Time Calculation (min): 18 min  OT Assessment / Plan / Recommendation History of present illness Pt was recently admitted with fecal impaction.  He has Multiple myeloma and presents with general deconditioning   Clinical Impression   Pt has multiple myeloma and presents with decreased activity tolerance and independence with adls.  He will benefit from skilled OT to increase independence with adls with supervision to min A level goals in acute.      OT Assessment  Patient needs continued OT Services    Follow Up Recommendations  Home health OT;Supervision/Assistance - 24 hour , depending upon progress   Barriers to Discharge      Equipment Recommendations  3 in 1 bedside comode (possibly--to be further assessed)    Recommendations for Other Services    Frequency  Min 2X/week    Precautions / Restrictions Precautions Precautions: Fall Restrictions Weight Bearing Restrictions: No   Pertinent Vitals/Pain No c/o pain.  Dyspnea 2/4    ADL  Grooming: Set up Where Assessed - Grooming: Supported sitting Upper Body Bathing: Set up Where Assessed - Upper Body Bathing: Supported sitting Lower Body Bathing: Moderate assistance Where Assessed - Lower Body Bathing: Supported sit to stand Upper Body Dressing: Minimal assistance (lines) Where Assessed - Upper Body Dressing: Supported sit to stand Lower Body Dressing: Maximal assistance Where Assessed - Lower Body Dressing: Supported sit to Pharmacist, hospital: Mining engineer Method: Surveyor, minerals:  (bed to chair) Toileting - Clothing Manipulation and Hygiene: Minimal assistance;Maximal assistance (min clothes  max hygiene) Where Assessed - Toileting Clothing Manipulation and Hygiene: Sit to stand from 3-in-1 or toilet;Supine, head of  bed flat (supine hygiene) Equipment Used: Rolling walker;Gait belt Transfers/Ambulation Related to ADLs: spt to chair, taking a couple of steps  dyspnea 2/4 ADL Comments: adls limited by endurance/fatique.  Pt had incontinence of bowel--thought this had happened but he wasn't sure.  Educated on energy conservation:  rests and breaking up activities.  Pt is to get 2 units blood today.  Hgb 8.0    OT Diagnosis: Generalized weakness  OT Problem List: Decreased strength;Decreased activity tolerance;Decreased knowledge of use of DME or AE;Cardiopulmonary status limiting activity OT Treatment Interventions: Self-care/ADL training;DME and/or AE instruction;Patient/family education;Therapeutic exercise;Therapeutic activities   OT Goals(Current goals can be found in the care plan section) Acute Rehab OT Goals Patient Stated Goal: i hope to go home OT Goal Formulation: With patient Time For Goal Achievement: 02/06/13 Potential to Achieve Goals: Good ADL Goals Pt Will Perform Grooming: with supervision;standing Pt Will Perform Lower Body Bathing: with supervision;sit to/from stand Pt Will Perform Lower Body Dressing: with min assist;sit to/from stand Pt Will Transfer to Toilet: with min guard assist;ambulating;bedside commode Pt Will Perform Toileting - Clothing Manipulation and hygiene: with supervision;sit to/from stand Additional ADL Goal #1: Pt will verbalize 3 energy conservation techniques Additional ADL Goal #2: pt will be supervision with written theraband HEP to increase activity tolerance for adls.   Visit Information  Last OT Received On: 01/23/13 Assistance Needed: +1 PT/OT Co-Evaluation/Treatment: Yes        Prior Functioning     Home Living Family/patient expects to be discharged to:: Private residence Living Arrangements: Spouse/significant other Available Help at Discharge: Family Type of Home: House Home Access: Stairs to enter [employer] of Steps:  4-5 Entrance Stairs-Rails: None Home Layout: Two level;Able to live on  main level with bedroom/bathroom Home Equipment: Walker - 2 wheels;Cane - single point;Bedside commode Prior Function Level of Independence: Independent with assistive device(s) Comments: pt was washing at sink Communication Communication: No difficulties         Vision/Perception     Cognition  Cognition Arousal/Alertness: Awake/alert Behavior During Therapy: WFL for tasks assessed/performed Overall Cognitive Status: Within Functional Limits for tasks assessed    Extremity/Trunk Assessment Upper Extremity Assessment Upper Extremity Assessment: Generalized weakness (LUE grossly 4-/5; RUE grossly 3+/5) Lower Extremity Assessment Lower Extremity Assessment: Generalized weakness Cervical / Trunk Assessment Cervical / Trunk Assessment: Normal     Mobility Bed Mobility Bed Mobility: Rolling Right;Right Sidelying to Sit Rolling Right: 5: Supervision Right Sidelying to Sit: 4: Min assist Details for Bed Mobility Assistance: Assist for trunk to upright. Increased time.  Transfers Sit to Stand: 4: Min assist;From bed Stand to Sit: 4: Min assist;To chair/3-in-1 Details for Transfer Assistance: assist to rise, stabilize, control descent.      Exercise     Balance     End of Session OT - End of Session Activity Tolerance: Patient limited by fatigue Patient left: in bed;with call bell/phone within reach Nurse Communication: Mobility status (bm, need sheets changed etc.)  GO     Nikeria Kalman 01/23/2013, 12:59 PM Marica Otter, OTR/L [phone] 01/23/2013

## 2013-01-23 NOTE — Progress Notes (Signed)
INITIAL NUTRITION ASSESSMENT  Pt meets criteria for severe MALNUTRITION in the context of chronic illness as evidenced by <75% estimated energy intake with 8% weight loss in the past month per pt report and weight trend.  DOCUMENTATION CODES Per approved criteria  -Severe malnutrition in the context of chronic illness   INTERVENTION: - Ensure Complete TID - Encouraged increased PO intake - Will continue to monitor   NUTRITION DIAGNOSIS: Increased nutrient needs related to multiple myeloma with recent chemotherapy as evidenced by MD notes.   Goal: Pt to consume >90% of meals/supplements  Monitor:  Weights, labs, intake  Reason for Assessment: Consult   79 y.o. male  Admitting Dx: Weakness, failure to thrive  ASSESSMENT: Pt with multiple myeloma and started chemotherapy earlier this month. Pt admitted with generalized weakness, lightheadedness, failure to thrive, constipation, anorexia, hypocalcemia, orthostasis, and hypotension. Was d/c from this hospital 8/3 where he was admitted for severe fecal impaction.   Met with pt who reports poor appetite PTA with some weight loss. Unsure if weight trend is accurate, but it indicates that pt has lost 12 pounds in the past 5 days. States he has been eating at least 1 meal/day at home. Reports his wife was planning on buying him some Boost to improve his nutrition. Pt ate 50% of lunch tray which consisted of some fish.   Pt with low potassium and sodium. Pt getting daily multivitamin, folic acid, and oral potassium chloride. Pt getting Senokot and Miralax for constipation.   Height: Ht Readings from Last 1 Encounters:  01/22/13 5\' 10"  (1.778 m)    Weight: Wt Readings from Last 1 Encounters:  01/22/13 133 lb 11.2 oz (60.646 kg)    Ideal Body Weight: 166 lb  % Ideal Body Weight: 80%  Wt Readings from Last 10 Encounters:  01/22/13 133 lb 11.2 oz (60.646 kg)  01/18/13 145 lb (65.772 kg)  01/05/13 145 lb (65.772 kg)  12/23/12 145  lb (65.772 kg)  12/16/12 144 lb (65.318 kg)  11/22/12 140 lb (63.504 kg)  07/25/11 151 lb 2 oz (68.55 kg)  07/02/11 149 lb (67.586 kg)  06/26/11 149 lb 9.6 oz (67.858 kg)    Usual Body Weight: 140-145 lb   % Usual Body Weight: 95-98%  BMI:  Body mass index is 19.18 kg/(m^2).  Estimated Nutritional Needs: Kcal: 1850-2150 Protein: 90-110g Fluid: 2 L/day  Skin: Intact   Diet Order: General  EDUCATION NEEDS: -No education needs identified at this time   Intake/Output Summary (Last 24 hours) at 01/23/13 1423 Last data filed at 01/23/13 1345  Gross per 24 hour  Intake   1140 ml  Output    920 ml  Net    220 ml    Last BM: PTA, several days  Labs:   Recent Labs Lab 01/17/13 0728 01/22/13 1056 01/23/13 0445  NA 130* 131* 130*  K 3.8 3.4* 3.3*  CL 97 99 101  CO2 26 24 22   BUN 19 9 10   CREATININE 0.91 0.90 0.93  CALCIUM 7.4* 7.0* 6.9*  GLUCOSE 130* 113* 126*    CBG (last 3)  No results found for this basename: GLUCAP,  in the last 72 hours  Scheduled Meds: . aspirin EC  162 mg Oral Daily  . citalopram  10 mg Oral Daily  . enoxaparin (LOVENOX) injection  40 mg Subcutaneous Q24H  . famciclovir  250 mg Oral QHS  . folic acid  2 mg Oral Daily  . furosemide  10 mg Intravenous Once  .  multivitamin with minerals  1 tablet Oral Daily  . pantoprazole  20 mg Oral Daily  . polyethylene glycol  17 g Oral Daily  . senna  1 tablet Oral BID  . tamsulosin  0.4 mg Oral Daily    Continuous Infusions: . dextrose 5 % and 0.9 % NaCl with KCl 20 mEq/L 75 mL/hr at 01/22/13 2027    Past Medical History  Diagnosis Date  . Diverticulosis of colon (without mention of hemorrhage)   . Esophagitis, unspecified   . 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 Personal history of colonic polyps   . Reflux   . Hernia   . RA (rheumatoid arthritis)   . Myeloma     possiblity of this after last physical, to follow up with hematology  . Multiple myeloma, without  mention of having achieved remission(203.00) 01/05/2013    Past Surgical History  Procedure Laterality Date  . Inguinal hernia repair  03/28/1990    dr price  . Orif hip fracture      Levon Hedger MS, RD, LDN [phone] Pager [phone] After Hours Pager

## 2013-01-23 NOTE — Progress Notes (Addendum)
Daughter Samuele Storey) requesting to be called with any updates please. Pt stated that it was okay to discuss care and plan of care with daughter, Raynelle Fanning.

## 2013-01-23 NOTE — Evaluation (Addendum)
Physical Therapy Evaluation Patient Details Name: Shawn Chen MRN: [id number] DOB: 05-Feb-1927 Today's Date: 01/23/2013 Time: 8657-8469 PT Time Calculation (min): 18 min  PT Assessment / Plan / Recommendation History of Present Illness  39 male was in ED this am and was admitted c generalized Weakness, Lightheaded, AFTT, No BM despite Laxatives, DeH, Anorexia, hypocalcemia, Orthostasis, and hypotension.  Denies N/V/Bloody stools Ab PAin, fevers, or CP/SOB or other issues. Wife states pt's weakness got worse last night. Had controlled slide to floor last night and has had polyuria since last night as well. Patient woke up this morning he was too weak to stand  Clinical Impression  On eval, pt required Min assist for mobility-able to pivot to recliner with RW. Demonstrates general weakness, decreaed activity tolerance. Pt states he wants to return home with wife assisting as needed. Recommend HHPT.     PT Assessment  Patient needs continued PT services    Follow Up Recommendations  Home health PT;Supervision/Assistance - 24 hour    Does the patient have the potential to tolerate intense rehabilitation      Barriers to Discharge        Equipment Recommendations  None recommended by PT    Recommendations for Other Services OT consult   Frequency Min 3X/week    Precautions / Restrictions Precautions Precautions: Fall Restrictions Weight Bearing Restrictions: No   Pertinent Vitals/Pain L ankle/foot 5/10. Pt thinks this is arthritis.       Mobility  Bed Mobility Bed Mobility: Rolling Right;Right Sidelying to Sit Rolling Right: 5: Supervision Right Sidelying to Sit: 4: Min assist Details for Bed Mobility Assistance: Assist for trunk to upright. Increased time.  Transfers Transfers: Sit to Stand;Stand to [employer] Transfers Sit to Stand: 4: Min assist;From bed Stand to Sit: 4: Min assist;To chair/3-in-1 Stand Pivot Transfers: 4: Min assist Details for Transfer  Assistance: assist to rise, stabilize, control descent.  Ambulation/Gait Ambulation/Gait Assistance Details: Deferred-pt very weak, fatigued-awaiting transfusions    Exercises     PT Diagnosis: Difficulty walking;Generalized weakness;Acute pain  PT Problem List: Decreased strength;Decreased activity tolerance;Decreased mobility;Decreased knowledge of use of DME;Pain PT Treatment Interventions: DME instruction;Gait training;Functional mobility training;Therapeutic activities;Therapeutic exercise;Patient/family education     PT Goals(Current goals can be found in the care plan section) Acute Rehab PT Goals Patient Stated Goal: i hope to go home PT Goal Formulation: With patient Time For Goal Achievement: 02/06/13 Potential to Achieve Goals: Good  Visit Information  Last PT Received On: 01/23/13 Assistance Needed: +1 History of Present Illness: 24 male whom I know well from last weekend when I admitted and Rxed him c Fecal Impaction/Ab Pain/N/V S/P Success c SMOG Enema.  I tried to keep him around and get therapy b/c i thought he was weak.  He saw PT and we set up HHPT and he wanted to go home.  He has known IgG MM and started treatments c Revlimid/Velcade/ Decadron/Zometa last week and Rxs this week on hold.   He was in ED this am and was admitted c generalized Weakness, Lightheaded, AFTT, No BM despite Laxatives, DeH, Anorexia, hypocalcemia, Orthostasis, and hypotension.  Denies N/V/Bloody stools Ab PAin, fevers, or CP/SOB or other issues. Wife states pt's weakness got worse last night. Had controlled slide to floor last night and has had polyuria since last night as well. Patient woke up this morning he was too weak to stand       Prior Functioning  Home Living Family/patient expects to be discharged to:: Private  residence Living Arrangements: Spouse/significant other Available Help at Discharge: Family Type of Home: House Home Access: Stairs to enter [employer] of Steps:  4-5 Entrance Stairs-Rails: None Home Layout: Two level;Able to live on main level with bedroom/bathroom Home Equipment: Dan Humphreys - 2 wheels;Cane - single point;Bedside commode Prior Function Level of Independence: Independent with assistive device(s) Comments: pt was washing at sink Communication Communication: No difficulties    Cognition  Cognition Arousal/Alertness: Awake/Chen Behavior During Therapy: WFL for tasks assessed/performed Overall Cognitive Status: Within Functional Limits for tasks assessed    Extremity/Trunk Assessment Upper Extremity Assessment Upper Extremity Assessment: Generalized weakness Lower Extremity Assessment Lower Extremity Assessment: Generalized weakness Cervical / Trunk Assessment Cervical / Trunk Assessment: Normal   Balance    End of Session PT - End of Session Equipment Utilized During Treatment: Gait belt Activity Tolerance: Patient limited by fatigue Patient left: in chair;with call bell/phone within reach  GP     Shawn Chen, MPT Pager: [phone]

## 2013-01-23 NOTE — Consult Note (Signed)
#   [id number] is consult note.  I will go ahead and transfuse him 2 units today.  I don't think that his myeloma, in the long run, is going to be a problem for him. I think that his treatments are probably causing him more harm than good. We will stop the treatments with Revlimid and Velcade.  I do suspect that he has underlying myelodysplasia by his bone marrow report. This I believe is where the anemia is originating from. This is what we need to focus and improving which I think will help him feel better and improve his quality of life.  I will transfuse him 2 units of blood.  Is hard to say how much the methotrexate that he is taking is contributing to his bone marrow dysfunction.  We will follow along.  I greatly appreciate the outstanding care that he is receiving by Dr. Timothy Lasso and the staff on 5E!!!  Pete e.  Romans 8:28

## 2013-01-24 DIAGNOSIS — R627 Adult failure to thrive: Secondary | ICD-10-CM

## 2013-01-24 DIAGNOSIS — T451X5A Adverse effect of antineoplastic and immunosuppressive drugs, initial encounter: Secondary | ICD-10-CM

## 2013-01-24 DIAGNOSIS — D6481 Anemia due to antineoplastic chemotherapy: Secondary | ICD-10-CM

## 2013-01-24 LAB — CBC
HCT: 30.4 % — ABNORMAL LOW (ref 39.0–52.0)
Hemoglobin: 10.5 g/dL — ABNORMAL LOW (ref 13.0–17.0)
MCH: 32 pg (ref 26.0–34.0)
MCHC: 34.5 g/dL (ref 30.0–36.0)
MCV: 92.7 fL (ref 78.0–100.0)
RBC: 3.28 MIL/uL — ABNORMAL LOW (ref 4.22–5.81)

## 2013-01-24 LAB — COMPREHENSIVE METABOLIC PANEL
ALT: 23 U/L (ref 0–53)
BUN: 10 mg/dL (ref 6–23)
CO2: 22 mEq/L (ref 19–32)
Calcium: 7.1 mg/dL — ABNORMAL LOW (ref 8.4–10.5)
GFR calc Af Amer: >90 mL/min (ref >90)
GFR calc non Af Amer: 78 mL/min — ABNORMAL LOW (ref >90)
Glucose, Bld: 114 mg/dL — ABNORMAL HIGH (ref 70–99)
Sodium: 130 mEq/L — ABNORMAL LOW (ref 135–145)
Total Protein: 6 g/dL (ref 6.0–8.3)

## 2013-01-24 LAB — TYPE AND SCREEN
ABO/RH(D): A POS
Antibody Screen: NEGATIVE
Unit division: 0

## 2013-01-24 MED ORDER — DOCUSATE SODIUM 100 MG PO CAPS
100.0000 mg | ORAL_CAPSULE | Freq: Two times a day (BID) | ORAL | Status: DC
  Administered 2013-01-24 (×2): 100 mg via ORAL
  Filled 2013-01-24 (×5): qty 1

## 2013-01-24 MED ORDER — DILTIAZEM HCL ER COATED BEADS 240 MG PO CP24
240.0000 mg | ORAL_CAPSULE | Freq: Every day | ORAL | Status: DC
  Administered 2013-01-24: 240 mg via ORAL
  Filled 2013-01-24 (×2): qty 1

## 2013-01-24 MED ORDER — AMLODIPINE BESYLATE 5 MG PO TABS
5.0000 mg | ORAL_TABLET | Freq: Every day | ORAL | Status: DC
  Administered 2013-01-26: 5 mg via ORAL
  Filled 2013-01-24 (×2): qty 1

## 2013-01-24 NOTE — Progress Notes (Signed)
Shawn Chen   DOB:August 29, 1926   WU#:[id number]   GNF#:[id number]  Subjective: patient was seen in the hospital today  on behalf of Dr. Theron Arista who is his primary oncologist.clinically patient states he does not feel good he is weak tired and fatigued. His pressures and other vitals remained pretty stable he remains afebrile. He denies any nausea or vomiting.   Objective:  Filed Vitals:   01/24/13 2148  BP: 135/79  Pulse: 70  Temp: 98.5 F (36.9 C)  Resp: 18    Body mass index is 19.18 kg/(m^2).  Intake/Output Summary (Last 24 hours) at 01/24/13 2224 Last data filed at 01/24/13 1730  Gross per 24 hour  Intake 1786.67 ml  Output    800 ml  Net 986.67 ml     Sclerae unicteric  Oropharynx clear  No peripheral adenopathy  Lungs clear -- no rales or rhonchi  Heart regular rate and rhythm  Abdomen benign  MSK no focal spinal tenderness, no peripheral edema  Neuro nonfocal    CBG (last 3)  No results found for this basename: GLUCAP,  in the last 72 hours   Labs:  Lab Results  Component Value Date   WBC 4.1 01/24/2013   HGB 10.5* 01/24/2013   HCT 30.4* 01/24/2013   MCV 92.7 01/24/2013   PLT 141* 01/24/2013   NEUTROABS 3.1 01/22/2013    Urine Studies No results found for this basename: UACOL, UAPR, USPG, UPH, UTP, UGL, UKET, UBIL, UHGB, UNIT, UROB, ULEU, UEPI, UWBC, URBC, UBAC, CAST, CRYS, UCOM, BILUA,  in the last 72 hours  Basic Metabolic Panel:  Recent Labs Lab 01/22/13 1056 01/23/13 0445 01/24/13 0520  NA 131* 130* 130*  K 3.4* 3.3* 3.6  CL 99 101 99  CO2 24 22 22   GLUCOSE 113* 126* 114*  BUN 9 10 10   CREATININE 0.90 0.93 0.85  CALCIUM 7.0* 6.9* 7.1*   GFR Estimated Creatinine Clearance: 55.5 ml/min (by C-G formula based on Cr of 0.85). Liver Function Tests:  Recent Labs Lab 01/22/13 1056 01/24/13 0520  AST 15 24  ALT 18 23  ALKPHOS 61 62  BILITOT 1.1 1.1  PROT 6.4 6.0  ALBUMIN 2.5* 2.3*   No results found for this basename: LIPASE, AMYLASE,  in the  last 168 hours No results found for this basename: AMMONIA,  in the last 168 hours Coagulation profile  Recent Labs Lab 01/22/13 1056  INR 1.25    CBC:  Recent Labs Lab 01/22/13 1056 01/23/13 0445 01/24/13 0520  WBC 4.2 5.0 4.1  NEUTROABS 3.1  --   --   HGB 9.0* 8.0* 10.5*  HCT 26.3* 23.4* 30.4*  MCV 94.3 94.4 92.7  PLT 146* 143* 141*   Cardiac Enzymes:  Recent Labs Lab 01/22/13 1056  TROPONINI <0.30   BNP: No components found with this basename: POCBNP,  CBG: No results found for this basename: GLUCAP,  in the last 168 hours D-Dimer No results found for this basename: DDIMER,  in the last 72 hours Hgb A1c No results found for this basename: HGBA1C,  in the last 72 hours Lipid Profile No results found for this basename: CHOL, HDL, LDLCALC, TRIG, CHOLHDL, LDLDIRECT,  in the last 72 hours Thyroid function studies No results found for this basename: TSH, T4TOTAL, FREET3, T3FREE, THYROIDAB,  in the last 72 hours Anemia work up No results found for this basename: VITAMINB12, FOLATE, FERRITIN, TIBC, IRON, RETICCTPCT,  in the last 72 hours Microbiology Recent Results (from the past 240 hour(s))  URINE CULTURE     Status: None   Collection Time    01/16/13  3:05 PM      Result Value Range Status   Specimen Description URINE, CLEAN CATCH   Final   Special Requests NONE   Final   Culture  Setup Time 01/16/2013 22:41   Final   Colony Count 3,000 COLONIES/ML   Final   Culture INSIGNIFICANT GROWTH   Final   Report Status 01/17/2013 FINAL   Final      Studies:  No results found.  Assessment: 79 y.o.with IgG lambda myeloma patient's been on Velcade and Revlimid unable to tolerate this. Per Dr. Caryl Comes nodes he may also have myelodysplastic syndrome. Patient was admitted with failure to thrive.he remains weak tired. Constipation seems to be improving since he did have a bowel movement.    Plan:   #1 IgG multiple myeloma: Patient has been on Velcade and Revlimid  we are holding this for now.  #2 anemia likely due to chemotherapy and underlying MDS he is status post 2 units of packed red cells on 01/23/2013 continue to monitor for now.  #3 disposition hopefully patient can be transferred to the skilled nursing facility for physical therapy rehabilitation and then home.  #4 patient remains full code   Drue Second 01/24/2013

## 2013-01-24 NOTE — Progress Notes (Addendum)
Subjective: Did not eat yesterday. Appears down today.  Appears weak Had large BM yesterday Has no appetite.   Objective: Vital signs in last 24 hours: Temp:  [97.1 F (36.2 C)-99.1 F (37.3 C)] 98.1 F (36.7 C) (08/09 0600) Pulse Rate:  [56-73] 69 (08/09 0600) Resp:  [16-20] 20 (08/09 0600) BP: (100-168)/(50-81) 156/75 mmHg (08/09 0600) SpO2:  [97 %-98 %] 97 % (08/09 0600) Weight change:  Last BM Date: 01/23/13  CBG (last 3)  No results found for this basename: GLUCAP,  in the last 72 hours  Intake/Output from previous day:  Intake/Output Summary (Last 24 hours) at 01/24/13 0909 Last data filed at 01/24/13 0800  Gross per 24 hour  Intake 2691.67 ml  Output   2170 ml  Net 521.67 ml   08/08 0701 - 08/09 0700 In: 1055 [P.O.:240; I.V.:500; Blood:315] Out: 2145 [Urine:2145]   Physical Exam General appearance: in bed.  Weak and frail. OP - moist Neck: no adenopathy, no carotid bruit, no JVD and thyroid not enlarged, symmetric, no tenderness/mass/nodules  Resp: CTA  Cardio: Reg  GI: soft,normal BS. min-tender no masses, no organomegaly  Extremities: extremities normal, atraumatic, no cyanosis or edema  Pulses: 2+ and symmetric  Lymph nodes: No cervical lymphadenopathy  Neurologic: Alert and oriented X 3. gen Weakness.   Lab Results:  Recent Labs  01/23/13 0445 01/24/13 0520  NA 130* 130*  K 3.3* 3.6  CL 101 99  CO2 22 22  GLUCOSE 126* 114*  BUN 10 10  CREATININE 0.93 0.85  CALCIUM 6.9* 7.1*     Recent Labs  01/22/13 1056 01/24/13 0520  AST 15 24  ALT 18 23  ALKPHOS 61 62  BILITOT 1.1 1.1  PROT 6.4 6.0  ALBUMIN 2.5* 2.3*     Recent Labs  01/22/13 1056 01/23/13 0445 01/24/13 0520  WBC 4.2 5.0 4.1  NEUTROABS 3.1  --   --   HGB 9.0* 8.0* 10.5*  HCT 26.3* 23.4* 30.4*  MCV 94.3 94.4 92.7  PLT 146* 143* 141*    Lab Results  Component Value Date   INR 1.25 01/22/2013   INR 1.27 01/21/2010   INR 1.36 01/20/2010     Recent Labs   01/22/13 1056  TROPONINI <0.30    No results found for this basename: TSH, T4TOTAL, FREET3, T3FREE, THYROIDAB,  in the last 72 hours  No results found for this basename: VITAMINB12, FOLATE, FERRITIN, TIBC, IRON, RETICCTPCT,  in the last 72 hours  Micro Results: Recent Results (from the past 240 hour(s))  URINE CULTURE     Status: None   Collection Time    01/16/13  3:05 PM      Result Value Range Status   Specimen Description URINE, CLEAN CATCH   Final   Special Requests NONE   Final   Culture  Setup Time 01/16/2013 22:41   Final   Colony Count 3,000 COLONIES/ML   Final   Culture INSIGNIFICANT GROWTH   Final   Report Status 01/17/2013 FINAL   Final     Studies/Results: Dg Abd Acute W/chest  01/22/2013   *RADIOLOGY REPORT*  Clinical Data: Nausea vomiting weakness, fatigue  ACUTE ABDOMEN SERIES (ABDOMEN 2 VIEW & CHEST 1 VIEW)  Comparison: 01/17/2013  Findings: Chest:  Cardiac enlargement without heart failure or edema.  Negative for pneumonia or effusion. Hiatal hernia noted.  Abdomen:  Normal bowel gas pattern.  Negative for bowel obstruction or free air.  Mild scoliosis.  Mild fracture T12 and L1.  These appear chronic.  IMPRESSION: No active cardiopulmonary disease.  Normal bowel gas pattern.   Original Report Authenticated By: [name], M.D.     Medications: Scheduled: . aspirin EC  162 mg Oral Daily  . citalopram  10 mg Oral Daily  . diltiazem  240 mg Oral Daily  . enoxaparin (LOVENOX) injection  40 mg Subcutaneous Q24H  . famciclovir  250 mg Oral QHS  . feeding supplement  237 mL Oral BID BM  . folic acid  2 mg Oral Daily  . multivitamin with minerals  1 tablet Oral Daily  . pantoprazole  20 mg Oral Daily  . polyethylene glycol  17 g Oral Daily  . senna  1 tablet Oral BID  . tamsulosin  0.4 mg Oral Daily   Continuous: . dextrose 5 % and 0.9 % NaCl with KCl 20 mEq/L 50 mL/hr at 01/23/13 2111     Assessment/Plan: Active Problems:   RA (rheumatoid arthritis)    GERD (gastroesophageal reflux disease)   Multiple myeloma, without mention of having achieved remission(203.00)   Fecal impaction   Dehydration   Generalized weakness   Unspecified constipation   Protein-calorie malnutrition, severe  generalized Weakness/Lightheaded/AFTT/DeH/Anorexia/hypocalcemia - corrects to 8.1/Orthostasis, and hypotension. Continue hydration. PT rec'd home w/ 24 hour assist but patient has concern that his wife may not be able to care for him. Encouraged him to ambulate and OOBTC today. Also encouraged him to try and eat as much as he can, may need addition of megace in the future. Cont to work w/ PT Onc agrees that holding his revlimid is appropriate given the benefit he is receiving is unlikely greater than the negative effects it is having on his lifestyle. Holding MTX this wkd. Will be next due on Sat 8/16. Transfused 2U PRBC by heme. Held BB. Added ensure to help w/ nutrition.   Constipation - appears he had BMs last night  IgG Lambda MM S/P treatments (Revlimid/Velcade/Decadron/Zometa)- heme on [facility] nd appreciate their recs. Revlimid is on hold. Transfused 2U PRBC on 8/8.   Generalized weakness- PT states home w/ 24hr assist. His wife is his only caregiver, and this may not be sufficient. He will discuss this w/ his wife and let us know if they would be more comfortable w/ rehab and transition to home.    Constipation - resolved. Placing on aggressive regimen of colace, senna and miralax to ensure stays controlled  RA - Holding MTX and adding folic acid   HTN - Stopped BB. Patient suffered from some bradycardia and junctional rhythm, so will change dilt to norvasc in the AM for BP control.  Anemia - likely in relation to chemo and potential component of MDS based on BM bx. Transfused 2U PRBC on 8/8 and Hgb 8>>10.5   Hyponatremia - following and stable at 130-131.   Hypokalemia - monitoring. Resolved.   Protein cal Malnutrition c Alb 2.5. Nutrition consult.  Added ensure to diet   DVT Proph - lovenox   Dispo: home w/ wife 24 hr assist vs SNF w/ transition home. He will discuss w/ his wife today   Full code for now.   LOS: 2 days   Magaret Justo 01/24/2013, 9:09 AM

## 2013-01-24 NOTE — Progress Notes (Signed)
Patient's daughter Raynelle Fanning called for update on patient's status and I was unable to speak with her at the time. I attempted to return the phone call and the call went to voice mail. I left message for her to call back.

## 2013-01-25 DIAGNOSIS — D469 Myelodysplastic syndrome, unspecified: Secondary | ICD-10-CM

## 2013-01-25 LAB — CBC
MCH: 32.3 pg (ref 26.0–34.0)
MCHC: 34.8 g/dL (ref 30.0–36.0)
MCV: 92.9 fL (ref 78.0–100.0)
Platelets: 136 10*3/uL — ABNORMAL LOW (ref 150–400)
RDW: 14.7 % (ref 11.5–15.5)

## 2013-01-25 LAB — COMPREHENSIVE METABOLIC PANEL
ALT: 29 U/L (ref 0–53)
AST: 28 U/L (ref 0–37)
CO2: 23 mEq/L (ref 19–32)
Calcium: 7.5 mg/dL — ABNORMAL LOW (ref 8.4–10.5)
Creatinine, Ser: 0.92 mg/dL (ref 0.50–1.35)
GFR calc non Af Amer: 75 mL/min — ABNORMAL LOW (ref >90)
Sodium: 129 mEq/L — ABNORMAL LOW (ref 135–145)
Total Protein: 6.1 g/dL (ref 6.0–8.3)

## 2013-01-25 MED ORDER — POLYETHYLENE GLYCOL 3350 17 G PO PACK
17.0000 g | PACK | Freq: Every day | ORAL | Status: DC | PRN
  Filled 2013-01-25: qty 1

## 2013-01-25 MED ORDER — MEGESTROL ACETATE 20 MG PO TABS
20.0000 mg | ORAL_TABLET | Freq: Every day | ORAL | Status: DC
  Administered 2013-01-25: 20 mg via ORAL
  Filled 2013-01-25: qty 1

## 2013-01-25 MED ORDER — MEGESTROL ACETATE 400 MG/10ML PO SUSP
400.0000 mg | Freq: Every day | ORAL | Status: DC
  Administered 2013-01-25 – 2013-01-26 (×2): 400 mg via ORAL
  Filled 2013-01-25 (×2): qty 10

## 2013-01-25 MED ORDER — LOPERAMIDE HCL 2 MG PO CAPS
2.0000 mg | ORAL_CAPSULE | ORAL | Status: DC | PRN
  Administered 2013-01-25: 2 mg via ORAL
  Filled 2013-01-25: qty 1

## 2013-01-25 NOTE — Progress Notes (Signed)
Clinical Social Work Department BRIEF PSYCHOSOCIAL ASSESSMENT 01/25/2013  Patient:  Shawn Chen, Shawn Chen     Account Number:  [id number]     Admit date:  01/22/2013  Clinical Social Worker:  Doroteo Glassman  Date/Time:  01/25/2013 02:51 PM  Referred by:  Physician  Date Referred:  01/25/2013 Referred for  SNF Placement   Other Referral:   Interview type:  Patient Other interview type:   Pt's son and wife at bedside    PSYCHOSOCIAL DATA Living Status:  WIFE Admitted from facility:   Level of care:   Primary support name:  Mrs. Diekman Primary support relationship to patient:  SPOUSE Degree of support available:   strong    CURRENT CONCERNS Current Concerns  Post-Acute Placement   Other Concerns:    SOCIAL WORK ASSESSMENT / PLAN Met with Pt and his wife and son.    Discussed d/c plans.  Pt and wife stated that Pt has been to [facility] in the past but that they would like [facility].  Discussed SNF process with Pt and wife.  Both agreeable to CSW beginning the SNF search in [place].    Provided Pt's wife with SNF list.    CSW thanked Pt and his family for their time.   Assessment/plan status:  Psychosocial Support/Ongoing Assessment of Needs Other assessment/ plan:   Information/referral to community resources:   SNF list    PATIENT'S/FAMILY'S RESPONSE TO PLAN OF CARE: Pt's wife stated that she just can't manage Pt on her own and that SNF is warranted.  Pt agrees and is ok with going to SNF.    Pt and wife thanked CSW for time and assistance.   Providence Crosby, LCSWA Clinical Social Work [phone]

## 2013-01-25 NOTE — Progress Notes (Signed)
IP PROGRESS NOTE  Subjective:   Lethargic. He reports not feeling well, no specific complaint.  Objective: Vital signs in last 24 hours: Blood pressure 127/71, pulse 94, temperature 98 F (36.7 C), temperature source Oral, resp. rate 18, height 5\' 10"  (1.778 m), weight 133 lb 11.2 oz (60.646 kg), SpO2 94.00%.  Intake/Output from previous day: 08/09 0701 - 08/10 0700 In: 2275.2 [I.V.:2275.2] Out: 2100 [Urine:2100]  Physical Exam:  HEENT: No thrush Lungs: Clear anteriorly, no respiratory distress Cardiac: Regular rate and rhythm Abdomen: Nontender, no hepatosplenomegaly Extremities: No leg edema     Lab Results:  Recent Labs  01/24/13 0520 01/25/13 0516  WBC 4.1 4.0  HGB 10.5* 10.4*  HCT 30.4* 29.9*  PLT 141* 136*    BMET  Recent Labs  01/24/13 0520 01/25/13 0516  NA 130* 129*  K 3.6 3.7  CL 99 98  CO2 22 23  GLUCOSE 114* 141*  BUN 10 9  CREATININE 0.85 0.92  CALCIUM 7.1* 7.5*    Studies/Results: No results found.  Medications: I have reviewed the patient's current medications.  Assessment/Plan:  1. IgG multiple myeloma-status post treatment with Velcade/Revlimid, discontinued  2. Anemia secondary to chemotherapy, multiple myeloma, and underlying myelodysplasia, stable following a red cell transfusion on 01/23/2013  3. Failure to thrive     LOS: 3 days   Sostenes Kauffmann  01/25/2013, 8:28 AM

## 2013-01-25 NOTE — Progress Notes (Signed)
Clinical Social Work Department CLINICAL SOCIAL WORK PLACEMENT NOTE 01/25/2013  Patient:  Shawn Chen, Shawn Chen  Account Number:  [id number] Admit date:  01/22/2013  Clinical Social Worker:  Doroteo Glassman  Date/time:  01/25/2013 02:54 PM  Clinical Social Work is seeking post-discharge placement for this patient at the following level of care:   SKILLED NURSING   (*CSW will update this form in Epic as items are completed)   01/25/2013  Patient/family provided with [facility] Department of Clinical Social Work's list of facilities offering this level of care within the geographic area requested by the patient (or if unable, by the patient's family).  01/25/2013  Patient/family informed of their freedom to choose among providers that offer the needed level of care, that participate in Medicare, Medicaid or managed care program needed by the patient, have an available bed and are willing to accept the patient.  01/25/2013  Patient/family informed of [facility]' ownership interest in [facility], as well as of the fact that they are under no obligation to receive care at this facility.  PASARR submitted to EDS on  PASARR number received from EDS on   FL2 transmitted to all facilities in geographic area requested by pt/family on  01/25/2013 FL2 transmitted to all facilities within larger geographic area on   Patient informed that his/her managed care company has contracts with or will negotiate with  certain facilities, including the following:     Patient/family informed of bed offers received:   Patient chooses bed at  Physician recommends and patient chooses bed at    Patient to be transferred to  on   Patient to be transferred to facility by   The following physician request were entered in Epic:   Additional Comments:  Providence Crosby, Theresia Majors Clinical Social Work [phone]

## 2013-01-25 NOTE — Progress Notes (Signed)
Subjective: States he ate 3 meals yesterday and tolerated shakes  Appears weak and down today  Had large BM 2 days ago and now having diarrhea  Has no appetite.   Objective: Vital signs in last 24 hours: Temp:  [98 F (36.7 C)-98.7 F (37.1 C)] 98 F (36.7 C) (08/10 0621) Pulse Rate:  [70-94] 94 (08/10 0621) Resp:  [18] 18 (08/10 0621) BP: (127-155)/(71-79) 127/71 mmHg (08/10 0621) SpO2:  [94 %-96 %] 94 % (08/10 0621) Weight change:  Last BM Date: 01/24/13  CBG (last 3)  No results found for this basename: GLUCAP,  in the last 72 hours  Intake/Output from previous day:  Intake/Output Summary (Last 24 hours) at 01/25/13 0946 Last data filed at 01/25/13 0622  Gross per 24 hour  Intake  638.5 ml  Output   1850 ml  Net -1211.5 ml   08/09 0701 - 08/10 0700 In: 2275.2 [I.V.:2275.2] Out: 2100 [Urine:2100]   Physical Exam General appearance: in bed.  Weak and frail. OP - moist Neck: no adenopathy, no carotid bruit, no JVD and thyroid not enlarged, symmetric, no tenderness/mass/nodules  Resp: CTA  Cardio: Reg  GI: soft,normal BS. min-tender no masses, no organomegaly  Extremities: extremities normal, atraumatic, no cyanosis or edema  Pulses: 2+ and symmetric  Lymph nodes: No cervical lymphadenopathy  Neurologic: Alert and oriented X 3. gen Weakness.   Lab Results:  Recent Labs  01/24/13 0520 01/25/13 0516  NA 130* 129*  K 3.6 3.7  CL 99 98  CO2 22 23  GLUCOSE 114* 141*  BUN 10 9  CREATININE 0.85 0.92  CALCIUM 7.1* 7.5*     Recent Labs  01/24/13 0520 01/25/13 0516  AST 24 28  ALT 23 29  ALKPHOS 62 68  BILITOT 1.1 1.1  PROT 6.0 6.1  ALBUMIN 2.3* 2.3*     Recent Labs  01/22/13 1056  01/24/13 0520 01/25/13 0516  WBC 4.2  < > 4.1 4.0  NEUTROABS 3.1  --   --   --   HGB 9.0*  < > 10.5* 10.4*  HCT 26.3*  < > 30.4* 29.9*  MCV 94.3  < > 92.7 92.9  PLT 146*  < > 141* 136*  < > = values in this interval not displayed.  Lab Results  Component  Value Date   INR 1.25 01/22/2013   INR 1.27 01/21/2010   INR 1.36 01/20/2010     Recent Labs  01/22/13 1056  TROPONINI <0.30    No results found for this basename: TSH, T4TOTAL, FREET3, T3FREE, THYROIDAB,  in the last 72 hours  No results found for this basename: VITAMINB12, FOLATE, FERRITIN, TIBC, IRON, RETICCTPCT,  in the last 72 hours  Micro Results: Recent Results (from the past 240 hour(s))  URINE CULTURE     Status: None   Collection Time    01/16/13  3:05 PM      Result Value Range Status   Specimen Description URINE, CLEAN CATCH   Final   Special Requests NONE   Final   Culture  Setup Time 01/16/2013 22:41   Final   Colony Count 3,000 COLONIES/ML   Final   Culture INSIGNIFICANT GROWTH   Final   Report Status 01/17/2013 FINAL   Final     Studies/Results: No results found.   Medications: Scheduled: . amLODipine  5 mg Oral Daily  . aspirin EC  162 mg Oral Daily  . citalopram  10 mg Oral Daily  . enoxaparin (LOVENOX) injection  40 mg Subcutaneous Q24H  . famciclovir  250 mg Oral QHS  . feeding supplement  237 mL Oral BID BM  . folic acid  2 mg Oral Daily  . megestrol  20 mg Oral Daily  . multivitamin with minerals  1 tablet Oral Daily  . pantoprazole  20 mg Oral Daily  . tamsulosin  0.4 mg Oral Daily   Continuous: . dextrose 5 % and 0.9 % NaCl with KCl 20 mEq/L 50 mL/hr at 01/25/13 0540     Assessment/Plan: Active Problems:   RA (rheumatoid arthritis)   GERD (gastroesophageal reflux disease)   Multiple myeloma, without mention of having achieved remission(203.00)   Fecal impaction   Dehydration   Generalized weakness   Unspecified constipation   Protein-calorie malnutrition, severe  generalized Weakness/Lightheaded/AFTT/DeH/Anorexia/hypocalcemia - corrects to 8.1/Orthostasis, and hypotension. Continue gentle IVF, cont between meal supplements, encouraged PO intake, adding megace today for mood stabilization as well as to possibly boost appetite. ambulate  and OOBTC. Holding his chemo per onc recs.  Holding MTX this wkd. Will be next due on Sat 8/16. Transfused 2U PRBC by heme. Held BB. Pt will need SNF placement   Constipation - now overcorrected and has diarrhea. Adding both imodium and miralax prn. Nothing scheduled   IgG Lambda MM S/P treatments (Revlimid/Velcade/Decadron/Zometa)- heme on boarda nd appreciate their recs. Revlimid is on hold. Transfused 2U PRBC on 8/8.   Generalized weakness- patient will need SNF placement. SW consult placed    RA - Holding MTX this wkd and adding folic acid   HTN - Stopped BB and dilt. Adding norvasc. Currently at goal   Anemia - stable. likely in relation to chemo and potential component of MDS based on BM bx. Transfused 2U PRBC on 8/8 and Hgb 8>>10.5   Hyponatremia - following and stable at 129-131.   Hypokalemia - monitoring. Resolved.   Protein cal Malnutrition c Alb 2.5. Nutrition consult. Added ensure to diet   DVT Proph - lovenox   Dispo:  SNF w/ transition home.   Full code for now.   LOS: 3 days   Shawn Chen 01/25/2013, 9:46 AM

## 2013-01-26 ENCOUNTER — Ambulatory Visit: Payer: Medicare Other

## 2013-01-26 ENCOUNTER — Other Ambulatory Visit: Payer: Medicare Other | Admitting: Lab

## 2013-01-26 ENCOUNTER — Encounter (HOSPITAL_COMMUNITY): Payer: Self-pay | Admitting: Internal Medicine

## 2013-01-26 LAB — CBC
Hemoglobin: 9.8 g/dL — ABNORMAL LOW (ref 13.0–17.0)
MCH: 31.9 pg (ref 26.0–34.0)
MCV: 93.2 fL (ref 78.0–100.0)
Platelets: 143 10*3/uL — ABNORMAL LOW (ref 150–400)
RBC: 3.07 MIL/uL — ABNORMAL LOW (ref 4.22–5.81)
WBC: 4.6 10*3/uL (ref 4.0–10.5)

## 2013-01-26 LAB — COMPREHENSIVE METABOLIC PANEL
ALT: 34 U/L (ref 0–53)
AST: 30 U/L (ref 0–37)
CO2: 22 mEq/L (ref 19–32)
Calcium: 7.5 mg/dL — ABNORMAL LOW (ref 8.4–10.5)
Chloride: 100 mEq/L (ref 96–112)
Creatinine, Ser: 0.75 mg/dL (ref 0.50–1.35)
GFR calc Af Amer: >90 mL/min (ref >90)
GFR calc non Af Amer: 82 mL/min — ABNORMAL LOW (ref >90)
Glucose, Bld: 106 mg/dL — ABNORMAL HIGH (ref 70–99)
Sodium: 131 mEq/L — ABNORMAL LOW (ref 135–145)
Total Bilirubin: 0.6 mg/dL (ref 0.3–1.2)

## 2013-01-26 MED ORDER — VITAMIN B-6 100 MG PO TABS
100.0000 mg | ORAL_TABLET | Freq: Two times a day (BID) | ORAL | Status: DC
  Administered 2013-01-26: 100 mg via ORAL
  Filled 2013-01-26 (×3): qty 1

## 2013-01-26 MED ORDER — AMLODIPINE BESYLATE 5 MG PO TABS: 5.0000 mg | ORAL_TABLET | Freq: Every day | ORAL | Status: DC

## 2013-01-26 MED ORDER — TESTOSTERONE CYPIONATE 200 MG/ML IM SOLN
400.0000 mg | Freq: Once | INTRAMUSCULAR | Status: AC
  Administered 2013-01-26: 400 mg via INTRAMUSCULAR
  Filled 2013-01-26: qty 2

## 2013-01-26 MED ORDER — MUSCLE RUB 10-15 % EX CREA
TOPICAL_CREAM | Freq: Two times a day (BID) | CUTANEOUS | Status: DC | PRN
  Administered 2013-01-26: 15:00:00 via TOPICAL
  Filled 2013-01-26: qty 85

## 2013-01-26 MED ORDER — TROLAMINE SALICYLATE 10 % EX CREA
TOPICAL_CREAM | Freq: Two times a day (BID) | CUTANEOUS | Status: DC | PRN
  Filled 2013-01-26: qty 85

## 2013-01-26 MED ORDER — TROLAMINE SALICYLATE 10 % EX CREA: TOPICAL_CREAM | Freq: Two times a day (BID) | CUTANEOUS | Status: DC | PRN

## 2013-01-26 MED ORDER — LOPERAMIDE HCL 2 MG PO CAPS: 2.0000 mg | ORAL_CAPSULE | ORAL | Status: DC | PRN

## 2013-01-26 MED ORDER — FOLIC ACID 1 MG PO TABS: 2.0000 mg | ORAL_TABLET | Freq: Every day | ORAL | Status: DC

## 2013-01-26 MED ORDER — MEGESTROL ACETATE 400 MG/10ML PO SUSP: 400.0000 mg | Freq: Every day | ORAL | Status: DC

## 2013-01-26 MED ORDER — ENSURE COMPLETE PO LIQD: 237.0000 mL | Freq: Two times a day (BID) | ORAL | Status: AC

## 2013-01-26 NOTE — Progress Notes (Signed)
Report call to Marisue Ivan at [facility]

## 2013-01-26 NOTE — Progress Notes (Signed)
Patient discharged to [facility] at 2310 via [facility]. No complaints noted, Wife notified of transfer

## 2013-01-26 NOTE — Progress Notes (Addendum)
CSW provided family with bed offers. They are agreeable to [facility]. [employer] auth submitted.  Marce Schartz C. Shams Fill MSW, LCSW [phone] Received Berkley Harvey from [employer]. Ambulance auth is #[id number].  Labrea Eccleston C. Clelia Trabucco MSW, LCSW [phone] Packet copied and placed in [place]. Family agreeable to non emergency ambulance. Understand no guarantee of payment. Patient is very happy to be leaving the hospital. [facility] called for transport.  Milbern Doescher C. Roshawn Lacina MSW, LCSW [phone]

## 2013-01-26 NOTE — Progress Notes (Signed)
Shawn Chen says that he is feeling a little better. He did get 2 units of blood on Friday.  His testosterone level is only 129. I will give him a dose of testosterone. I think this will certainly help him feel better and also to help with his blood production.  I suspect that he will likely need an ESA for his anemia. I believe that his anemia is more reflective of underlying myelodysplasia then it is of myeloma. I started him on vitamin B6 to see this not help a little bit.  I think physical therapy also would help his performance status.  His appetite might be getting a little better.  His vital signs look good. Blood pressure 137/75. Temperature 97.8. His lungs sound clear. Cardiac exam regular rate and rhythm with a normal S1-S2. Rate and occasional extra beat. It is a 1/6 systolic ejection murmur. Abdomen is soft. Has good bowel sounds. There is no fluid wave. There is no palpable hepatosplenomegaly. Extremities shows severe changes secondary to rheumatoid arthritis.  His labs show his white cell count to be 4.6. Hemoglobin is 9.8. Platelet count 143. Potassium 3.2. Calcium 7.5 with an albumin of 1.9.  Hopefully, the testosterone will help make him feel more active.  His rheumatoid arthritis can certainly be a problem. Methotrexate does seem to help with the joint issues. Since we are focusing on quality of life, I think that he should continue the methotrexate and we will try to "negate" its effect on the marrow by trying ESA therapy.  [name] 1:17

## 2013-01-26 NOTE — Discharge Summary (Signed)
DISCHARGE SUMMARY  Shawn Chen  MR#: [id number]  DOB:01-19-1927  Date of Admission: 01/22/2013 Date of Discharge: 01/26/2013  Attending Physician:Cortni Tays,W DOUGLAS  Patient's HYQ:MVHQ,I Riley Lam, MD  Consults:Treatment Team:  Josph Macho, MD - Hematology/Oncology  Discharge Diagnoses: Active Problems:   Failure to thrive    Generalized weakness   Multiple myeloma, without mention of having achieved remission(203.00)   RA (rheumatoid arthritis)   GERD (gastroesophageal reflux disease)   Unspecified constipation with Fecal impaction   Dehydration   Protein-calorie malnutrition, severe   Major Depression  Past Medical History  Diagnosis Date  . Diverticulosis of colon (without mention of hemorrhage)   . Esophagitis, unspecified   . 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 Personal history of colonic polyps   . Reflux   . Hernia   . RA (rheumatoid arthritis)   . Myeloma   . Multiple myeloma, without mention of having achieved remission(203.00) 01/05/2013  . Subdural hematoma 6/14    secondary to fall    Past Surgical History  Procedure Laterality Date  . Inguinal hernia repair  03/28/1990    dr price  . Orif hip fracture      Discharge Medications:   Medication List    STOP taking these medications       diltiazem 240 MG 24 hr capsule  Commonly known as:  CARDIZEM CD     methotrexate 2.5 MG tablet  Commonly known as:  RHEUMATREX     PRESCRIPTION MEDICATION     prochlorperazine 5 MG tablet  Commonly known as:  COMPAZINE     promethazine 25 MG tablet  Commonly known as:  PHENERGAN     REVLIMID 20 MG Caps  Generic drug:  Lenalidomide      TAKE these medications       alendronate 70 MG tablet  Commonly known as:  FOSAMAX  Take 70 mg by mouth every 7 (seven) days. Take with a full glass of water on an empty stomach.     amLODipine 5 MG tablet  Commonly known as:  NORVASC  Take 1 tablet (5 mg total) by mouth daily.     aspirin EC  81 MG tablet  Take 162 mg by mouth daily.     citalopram 10 MG tablet  Commonly known as:  CELEXA  Take 10 mg by mouth daily.     famciclovir 500 MG tablet  Commonly known as:  FAMVIR  Take 250 mg by mouth at bedtime.     feeding supplement Liqd  Take 237 mLs by mouth 2 (two) times daily between meals.     folic acid 1 MG tablet  Commonly known as:  FOLVITE  Take 2 tablets (2 mg total) by mouth daily.     loperamide 2 MG capsule  Commonly known as:  IMODIUM  Take 1 capsule (2 mg total) by mouth as needed for diarrhea or loose stools.     megestrol 400 MG/10ML suspension  Commonly known as:  MEGACE  Take 10 mLs (400 mg total) by mouth daily.     metoprolol succinate 50 MG 24 hr tablet  Commonly known as:  TOPROL-XL  Take 25 mg by mouth every morning. Take with or immediately following a meal.     multivitamin with minerals Tabs tablet  Take 1 tablet by mouth daily.     ondansetron 8 MG disintegrating tablet  Commonly known as:  ZOFRAN-ODT  Take 1 tablet (8 mg total) by mouth every 12 (twelve)  hours as needed for nausea.     pantoprazole 20 MG tablet  Commonly known as:  PROTONIX  Take 20 mg by mouth daily.     polyethylene glycol packet  Commonly known as:  MIRALAX / GLYCOLAX  Take 17 g by mouth daily.     simvastatin 40 MG tablet  Commonly known as:  ZOCOR  Take 40 mg by mouth daily.     tamsulosin 0.4 MG Caps capsule  Commonly known as:  FLOMAX  Take 0.4 mg by mouth daily.        Hospital Procedures: Abd 1 View (kub)  01/17/2013   *RADIOLOGY REPORT*  Clinical Data:   Fecal impaction.  ABDOMEN - 1 VIEW  Comparison: 01/16/2013  Findings: Nonobstructive bowel gas pattern.  Oral contrast material seen within the right colon and rectum.  Moderate stool burden, particularly in the rectum.  No free air organomegaly.  No acute bony abnormality.  IMPRESSION: Moderate stool burden.  No obstruction.   Original Report Authenticated By: Charlett Nose, M.D.   Ct Abdomen  Pelvis W Contrast  01/16/2013   *RADIOLOGY REPORT*  Clinical Data: Lower abdominal pain.  Patient diagnosed with multiple myeloma 3 weeks ago and underwent first chemotherapy treatment 4 days ago.  Prior history of diverticulitis.  Surgical history includes inguinal hernia repair.  CT ABDOMEN AND PELVIS WITH CONTRAST  Technique:  Multidetector CT imaging of the abdomen and pelvis was performed following the standard protocol during bolus administration of intravenous contrast.  Contrast: 100 ml Omnipaque-300 IV.  Oral contrast was also administered.  Comparison: CT abdomen and pelvis 05/08/2012, 08/11/2010.  Findings: Very large stool burden in the rectum and in the tortuous sigmoid colon.  Scattered diverticula involving the descending and sigmoid colon without evidence of acute diverticulitis currently. Upper normal caliber to mildly distended transverse colon and ascending colon containing liquid stool.  Wall thickening involving the proximal descending colon, without evidence of significant colonic wall thickening elsewhere.  Solitary diverticulum arising from the cecum without evidence of acute diverticulitis.  Large hiatal hernia.  Normal appearing small bowel.  No ascites. Appendix not visualized but no pericecal inflammation.  Mild diffuse hepatic steatosis without focal hepatic parenchymal abnormality.  Normal appearing spleen, pancreas, and adrenal glands.  Focus of accessory splenic tissue anterior to the lower pole of the spleen.  Bilateral renal cysts, unchanged from the prior examination.  Approximate 9 mm gallstone within the otherwise normal-appearing gallbladder.  No biliary ductal dilation. Moderate aorto-iliofemoral atherosclerosis without aneurysm.  No significant lymphadenopathy.  Moderate prostate gland enlargement.  Wall thickening involving the dome of the bladder as noted previously, unchanged.  Numerous pelvic phleboliths.  Bilateral inguinal hernia repair with mesh. No evidence of  recurrent hernia.  Prior ORIF of a left femoral neck fracture with healing.  Bone window images demonstrate generalized osseous demineralization, compression fractures of T12 and L1 (visualized on the metastatic bone survey 11/24/2012).  Scarring and expected dependent atelectasis in the visualized lower lobes.  Heart enlarged but stable.  IMPRESSION:  1.  Probable rectal and sigmoid colon fecal impaction.  This causes mild distention of the transverse colon, ascending colon, and cecum. 2.  Focal wall thickening involving the proximal descending colon. This may indicate localized colitis. 3.  No acute abnormalities otherwise involving the abdomen or pelvis. 4.  Cholelithiasis without evidence of acute cholecystitis. 5.  Mild diffuse hepatic steatosis without focal hepatic parenchymal abnormality. 6.  Stable large hiatal hernia. 7.  Stable prostate gland enlargement.  Wall thickening  involving the dome of the bladder is unchanged and likely due to chronic outlet obstruction.   Original Report Authenticated By: Hulan Saas, M.D.   Dg Abd Acute W/chest  01/22/2013   *RADIOLOGY REPORT*  Clinical Data: Nausea vomiting weakness, fatigue  ACUTE ABDOMEN SERIES (ABDOMEN 2 VIEW & CHEST 1 VIEW)  Comparison: 01/17/2013  Findings: Chest:  Cardiac enlargement without heart failure or edema.  Negative for pneumonia or effusion. Hiatal hernia noted.  Abdomen:  Normal bowel gas pattern.  Negative for bowel obstruction or free air.  Mild scoliosis.  Mild fracture T12 and L1.  These appear chronic.  IMPRESSION: No active cardiopulmonary disease.  Normal bowel gas pattern.   Original Report Authenticated By: [name], M.D.   Dg Abd Acute W/chest  01/16/2013   *RADIOLOGY REPORT*  Clinical Data: Vomiting and nausea.  Constipation.  ACUTE ABDOMEN SERIES (ABDOMEN 2 VIEW & CHEST 1 VIEW)  Comparison: Bone survey from 11/24/2012.  CT scan from 05/08/2012  Findings: Hyperexpansion is consistent with emphysema. Nodular density  overlying the right midlung is stable.  No edema or focal airspace consolidation. The cardiopericardial silhouette is enlarged.  Moderate hiatal hernia noted.  Upright film shows no evidence for intraperitoneal free air. There is no evidence for gaseous bowel dilation to suggest obstruction. Prominent stool noted in the sigmoid colon and rectum.  The patient is status post ventral mesh placement in the lower anterior abdominal wall.  Incomplete visualization of hardware used for right hip fixation.  IMPRESSION: Stable appearance of right mid lung nodule.  CT chest without contrast recommended to further evaluate.  No evidence for bowel perforation or obstruction.   Original Report Authenticated By: Kennith Center, M.D.   Transfusion 2 units packed red blood cells (8/8)  History of Present Illness: Shawn Chen is an 79 year old white male with a history of recently diagnosed IgG lambda multiple myeloma, long-standing rheumatoid arthritis on methotrexate who presented to the emergency department with increased weakness. She was diagnosed with multiple myeloma in 7/14. Shawn Chen had a bone marrow biopsy performed at that time that showed a hypercellular marrow with 17% plasma cells. Cytogenetics showed 13 q minus and a trisomy 11 abnormality. Shawn Chen was started on Revlimid and Velcade regimen per Dr. Myna Hidalgo. Since initiating treatment Shawn Chen has become progressively weaker. Shawn Chen was hospitalized from 8/1 through 01/18/13 for fecal impaction. This resolved with a SMOG enema and Shawn Chen was discharged home.  Shawn Chen chemotherapy was held. Shawn Chen reports in a to the emergency department on 8/7 with increasing weakness resulting in a controlled fall on the evening prior to admission with no injury. On the morning of admission Shawn Chen was too weak to stand. Shawn Chen wife r brought him back to the emergency department where Shawn Chen was was found have hyponatremia and evidence of mild volume depletion. Shawn Chen was admitted for further management.  Hospital  Course: Patient was admitted to a medical bed. Shawn Chen was gently hydrated for Shawn Chen volume depletion. With hydration, Shawn Chen hemoglobin trended down to 8.0. Shawn Chen was transfused 2 units packed red blood cells- Hemoglobin remains stable following transfusion. Dr. Myna Hidalgo (Hematology) evaluated the patient and recommended continuing to hold Shawn Chen Velcade and Revlimid. Shawn Chen also suggested holding Shawn Chen methotrexate feeling the myelodysplasia related to long-term use of methotrexate be contributing more to Shawn Chen anemia.   Despite these interventions, Shawn Chen remains weak and will require skilled nursing rehabilitation prior to return home as Shawn Chen wife will be unable to provide the assistance required to return home at this time. On the morning  of discharge, Shawn Chen states Shawn Chen is feeling much better with no new complaints. Shawn Chen constipation has resolved with transient diarrhea which has also improved.  Day of Discharge Exam BP 137/75  Pulse 60  Temp(Src) 97.8 F (36.6 C) (Axillary)  Resp 16  Ht 5\' 10"  (1.778 m)  Wt 60.646 kg (133 lb 11.2 oz)  BMI 19.18 kg/m2  SpO2 98%  Physical Exam: General appearance: alert and frail Eyes: no scleral icterus Throat: oropharynx moist without erythema Resp: clear to auscultation bilaterally Cardio: regular rate and rhythm GI: soft, non-tender; bowel sounds normal; no masses,  no organomegaly Extremities: no clubbing, cyanosis or edema  Discharge Labs:  Recent Labs  01/25/13 0516 01/26/13 0500  NA 129* 131*  K 3.7 3.2*  CL 98 100  CO2 23 22  GLUCOSE 141* 106*  BUN 9 10  CREATININE 0.92 0.75  CALCIUM 7.5* 7.5*    Recent Labs  01/25/13 0516 01/26/13 0500  AST 28 30  ALT 29 34  ALKPHOS 68 66  BILITOT 1.1 0.6  PROT 6.1 5.7*  ALBUMIN 2.3* 1.9*    Recent Labs  01/25/13 0516 01/26/13 0500  WBC 4.0 4.6  HGB 10.4* 9.8*  HCT 29.9* 28.6*  MCV 92.9 93.2  PLT 136* 143*   Lab Results  Component Value Date   INR 1.25 01/22/2013   INR 1.27 01/21/2010   INR 1.36 01/20/2010     Discharge instructions:     Discharge Orders   Future Appointments Provider Department Dept Phone   02/09/2013 9:00 AM Sharlotte Alamo [facility] [phone]   02/09/2013 9:30 AM Josph Macho, MD [facility] [phone]   Future Orders Complete By Expires     Diet general  As directed     Discharge instructions  As directed     Comments:      Hold Methotrexate for now- consider resuming if arthritis flares and blood counts remain stable.  Continue PT/OT.    Increase activity slowly  As directed        Disposition: to Skilled Nursing Facility for short-term rehabilitation  Follow-up Appts: Follow-up with Dr. [name] at [facility] within one week of discharge from the skilled nursing facility.  Call for appointment. Follow up with Dr. Myna Hidalgo within the next 2 weeks as scheduled  Condition on Discharge: stable  Tests Needing Follow-up: CBC within 1 week  Time with discharge activities: 40 minutes  Signed: Zyria Fiscus,W DOUGLAS 01/26/2013, 6:59 AM

## 2013-01-26 NOTE — Clinical Social Work Placement (Signed)
      Clinical Social Work Department CLINICAL SOCIAL WORK PLACEMENT NOTE 01/26/2013  Patient:  KADIN, CANIPE  Account Number:  [id number] Admit date:  01/22/2013  Clinical Social Worker:  Doroteo Glassman  Date/time:  01/25/2013 02:54 PM  Clinical Social Work is seeking post-discharge placement for this patient at the following level of care:   SKILLED NURSING   (*CSW will update this form in Epic as items are completed)   01/25/2013  Patient/family provided with [facility] Department of Clinical Social Works list of facilities offering this level of care within the geographic area requested by the patient (or if unable, by the patients family).  01/25/2013  Patient/family informed of their freedom to choose among providers that offer the needed level of care, that participate in Medicare, Medicaid or managed care program needed by the patient, have an available bed and are willing to accept the patient.  01/25/2013  Patient/family informed of [facility] ownership interest in [facility], as well as of the fact that they are under no obligation to receive care at this facility.  PASARR submitted to [employer] on 01/26/2013 PASARR number received from [employer] on 01/26/2013  FL2 transmitted to all facilities in geographic area requested by pt/family on  01/25/2013 FL2 transmitted to all facilities within larger geographic area on   Patient informed that his/her managed care company has contracts with or will negotiate with  certain facilities, including the following:     Patient/family informed of bed offers received:  01/26/2013 Patient chooses bed at [facility] Physician recommends and patient chooses bed at    Patient to be transferred to [facility] on  01/26/2013 Patient to be transferred to facility by [facility]  The following physician request were entered in Epic:   Additional Comments:

## 2013-01-26 NOTE — Progress Notes (Addendum)
Physical Therapy Treatment Patient Details Name: Shawn Chen MRN: [id number] DOB: 23-May-1927 Today's Date: 01/26/2013 Time: 4098-1191 PT Time Calculation (min): 18 min  PT Assessment / Plan / Recommendation  History of Present Illness weakness, anemia, multiple myeloma, RA. Recent admit, d/c ~1-2 weeks ago.    PT Comments   Pt continues to demonstrate general weakness and decreased activity tolerance. Pt also c/o bil foot pain, 6/10 with activity. At this time, feel pt may need ST SNF for continued rehab to improve functional mobility and regain independence.   Follow Up Recommendations  SNF     Does the patient have the potential to tolerate intense rehabilitation     Barriers to Discharge        Equipment Recommendations  None recommended by PT    Recommendations for Other Services OT consult  Frequency Min 3X/week   Progress towards PT Goals Progress towards PT goals: Progressing toward goals  Plan Discharge plan needs to be updated    Precautions / Restrictions Precautions Precautions: Fall Restrictions Weight Bearing Restrictions: No   Pertinent Vitals/Pain bil feet - 5/10 at rest, 6/10 with activity. Pt states nursing already aware- "they're bringing me tylenol."    Mobility  Bed Mobility Bed Mobility: Supine to Sit Right Sidelying to Sit: 4: Min assist;HOB elevated;With rails Details for Bed Mobility Assistance: Assist for trunk to upright. Increased time. VCS  technique, hand placement. Seated rest break needed before attempting standing.  Transfers Transfers: Sit to Stand;Stand to Sit Sit to Stand: 4: Min assist;From elevated surface;From bed Stand to Sit: To chair/3-in-1;4: Min assist;With armrests Details for Transfer Assistance: assist to rise, stabilize, control descent. VCS technique, hand placement Ambulation/Gait Ambulation/Gait Assistance: 4: Min assist Ambulation Distance (Feet): 3 Feet Assistive device: Rolling walker Ambulation/Gait  Assistance Details: Ambulation limited by fatigue and bil foot pain-pt rates 6/10. Dyspnea 3/4 with activity. Gait Pattern: Step-to pattern;Decreased stride length;Antalgic;Decreased step length - right;Decreased step length - left    Exercises     PT Diagnosis:    PT Problem List:   PT Treatment Interventions:     PT Goals (current goals can now be found in the care plan section)    Visit Information  Last PT Received On: 01/26/13 Assistance Needed: +1 History of Present Illness: weakness, anemia, multiple myeloma. recent admit/d/c ~1-2 weeks ago.     Subjective Data      Cognition  Cognition Arousal/Alertness: Awake/alert Behavior During Therapy: WFL for tasks assessed/performed Overall Cognitive Status: Within Functional Limits for tasks assessed    Balance     End of Session PT - End of Session Equipment Utilized During Treatment: Gait belt Activity Tolerance: Patient limited by fatigue;Patient limited by pain Patient left: in chair;with call bell/phone within reach   GP     Rebeca Alert, MPT Pager: [phone]

## 2013-01-26 NOTE — Progress Notes (Addendum)
Occupational Therapy Treatment Patient Details Name: NIZAR CUTLER MRN: [id number] DOB: 1926/12/05 Today's Date: 01/26/2013 Time: 5366-4403 OT Time Calculation (min): 18 min  OT Assessment / Plan / Recommendation  History of present illness weakness, anemia, multiple myeloma. recent admit/d/c ~1-2 weeks ago.    OT comments  Pt is motivated and wants to do more but limited this visit due to bilateral foot pain-- 6/10 with movement. He will benefit from skilled rehab after discharge to improve independence and activity tolerance for eventual d/c back home.   Follow Up Recommendations  SNF;Supervision/Assistance - 24 hour    Barriers to Discharge       Equipment Recommendations  3 in 1 bedside comode    Recommendations for Other Services    Frequency Min 2X/week   Progress towards OT Goals Progress towards OT goals: Not progressing toward goals - comment (not this visit due to bilateral foot pain)  Plan Discharge plan needs to be updated    Precautions / Restrictions Precautions Precautions: Fall Restrictions Weight Bearing Restrictions: No   Pertinent Vitals/Pain 510 at rest; 6/10 with activity    ADL  Grooming: Wash/dry face;Set up Where Assessed - Grooming: Supported sitting Toilet Transfer: Minimal assistance Toilet Transfer Method: Stand pivot (only stand pivot due to bilateral foot pain) Equipment Used: Rolling walker ADL Comments: Pt limited this session by bilateral foot pain 6/10 with movement. Pt indicating pain even with covers being pulled away from feet or pressure of sock donned. Pt does have some SOB with minimal activity so encouraged PLB throughout session.     OT Diagnosis:    OT Problem List:   OT Treatment Interventions:     OT Goals(current goals can now be found in the care plan section) Acute Rehab OT Goals Patient Stated Goal: pt agreeable to up to chair  Potential to Achieve Goals: Good  Visit Information  Last OT Received On:  01/26/13 Assistance Needed: +1 PT/OT Co-Evaluation/Treatment: Yes History of Present Illness: weakness, anemia, multiple myeloma. recent admit/d/c ~1-2 weeks ago.     Subjective Data      Prior Functioning       Cognition  Cognition Arousal/Alertness: Awake/alert Behavior During Therapy: WFL for tasks assessed/performed Overall Cognitive Status: Within Functional Limits for tasks assessed    Mobility  Bed Mobility Bed Mobility: Supine to Sit Right Sidelying to Sit: 4: Min assist;HOB elevated;With rails Details for Bed Mobility Assistance: Assist for trunk to upright. Increased time. VCS  technique, hand placement. Seated rest break needed before attempting standing.  Transfers Sit to Stand: 4: Min assist;From elevated surface;From bed Stand to Sit: To chair/3-in-1;4: Min assist;With armrests Details for Transfer Assistance: assist to rise, stabilize, control descent. VCS technique, hand placement    Exercises      Balance     End of Session OT - End of Session Activity Tolerance: Patient limited by pain Patient left: in chair;with call bell/phone within reach  GO     Lennox Laity [phone] 01/26/2013, 9:33 AM

## 2013-01-27 ENCOUNTER — Non-Acute Institutional Stay (SKILLED_NURSING_FACILITY): Payer: Medicare Other | Admitting: Internal Medicine

## 2013-01-27 DIAGNOSIS — D630 Anemia in neoplastic disease: Secondary | ICD-10-CM

## 2013-01-27 DIAGNOSIS — C9000 Multiple myeloma not having achieved remission: Secondary | ICD-10-CM

## 2013-01-27 DIAGNOSIS — R627 Adult failure to thrive: Secondary | ICD-10-CM

## 2013-01-27 DIAGNOSIS — M069 Rheumatoid arthritis, unspecified: Secondary | ICD-10-CM

## 2013-01-29 ENCOUNTER — Other Ambulatory Visit: Payer: Self-pay | Admitting: *Deleted

## 2013-01-29 DIAGNOSIS — C9000 Multiple myeloma not having achieved remission: Secondary | ICD-10-CM

## 2013-01-29 MED ORDER — LENALIDOMIDE 20 MG PO CAPS: 20.0000 mg | ORAL_CAPSULE | Freq: Every day | ORAL | Status: DC

## 2013-01-30 ENCOUNTER — Encounter: Payer: Self-pay | Admitting: *Deleted

## 2013-01-30 NOTE — Progress Notes (Signed)
Spoke with Will from [facility]. Patient willnot be taking Revlimid anymore.  Told him to stop RX

## 2013-02-03 ENCOUNTER — Non-Acute Institutional Stay (SKILLED_NURSING_FACILITY): Payer: Medicare Other | Admitting: Internal Medicine

## 2013-02-03 DIAGNOSIS — D649 Anemia, unspecified: Secondary | ICD-10-CM | POA: Insufficient documentation

## 2013-02-03 NOTE — Progress Notes (Signed)
PROGRESS NOTE  DATE: 02/03/2013  FACILITY:  [facility]  LEVEL OF CARE: SNF (31)  Acute Visit  CHIEF COMPLAINT:  Manage anemia  HISTORY OF PRESENT ILLNESS: I was requested by the staff to assess the patient regarding above problem(s):  ANEMIA: The anemia has been stable. The patient denies fatigue, melena or hematochezia. On 01/30/13 Hb 10.4, mcv 92.8.  On 01/26/13 Hb 9.8.  PAST MEDICAL HISTORY : Reviewed.  No changes.  CURRENT MEDICATIONS: Reviewed per [facility]  PHYSICAL EXAMINATION  GENERAL: no acute distress, normal body habitus RESPIRATORY: breathing is even & unlabored, BS CTAB CARDIAC: RRR, no murmur,no extra heart sounds, no edema  LABS/RADIOLOGY: See HPI  ASSESSMENT/PLAN:  Anemia- Hb improved.  CPT CODE: 16109

## 2013-02-05 ENCOUNTER — Telehealth: Payer: Self-pay | Admitting: Hematology & Oncology

## 2013-02-05 NOTE — Telephone Encounter (Signed)
Patient's wife walked into office and cx 02/09/13 apt due to patient being in rehab

## 2013-02-06 ENCOUNTER — Other Ambulatory Visit: Payer: Self-pay | Admitting: Geriatric Medicine

## 2013-02-06 MED ORDER — ALPRAZOLAM 0.25 MG PO TABS: 0.2500 mg | ORAL_TABLET | Freq: Three times a day (TID) | ORAL | Status: DC

## 2013-02-08 ENCOUNTER — Encounter (HOSPITAL_COMMUNITY)
Admission: EM | Disposition: A | Discharge status: Rehabilitation Facility | Payer: Self-pay | Source: Home / Self Care | Attending: Hematology & Oncology

## 2013-02-08 ENCOUNTER — Inpatient Hospital Stay (HOSPITAL_COMMUNITY): Payer: Medicare Other | Admitting: Anesthesiology

## 2013-02-08 ENCOUNTER — Inpatient Hospital Stay (HOSPITAL_COMMUNITY)
Admission: EM | Admit: 2013-02-08 | Discharge: 2013-02-11 | DRG: 026 | Disposition: A | Discharge status: Rehabilitation Facility | Payer: Medicare Other | Attending: Hematology & Oncology | Admitting: Hematology & Oncology

## 2013-02-08 ENCOUNTER — Emergency Department (HOSPITAL_COMMUNITY): Payer: Medicare Other

## 2013-02-08 ENCOUNTER — Encounter (HOSPITAL_COMMUNITY): Payer: Self-pay | Admitting: Anesthesiology

## 2013-02-08 ENCOUNTER — Encounter (HOSPITAL_COMMUNITY): Payer: Self-pay | Admitting: *Deleted

## 2013-02-08 ENCOUNTER — Non-Acute Institutional Stay (SKILLED_NURSING_FACILITY): Payer: Medicare Other | Admitting: Internal Medicine

## 2013-02-08 DIAGNOSIS — Z7982 Long term (current) use of aspirin: Secondary | ICD-10-CM

## 2013-02-08 DIAGNOSIS — S065XAA Traumatic subdural hemorrhage with loss of consciousness status unknown, initial encounter: Principal | ICD-10-CM | POA: Diagnosis present

## 2013-02-08 DIAGNOSIS — C9000 Multiple myeloma not having achieved remission: Secondary | ICD-10-CM | POA: Diagnosis present

## 2013-02-08 DIAGNOSIS — S065X9A Traumatic subdural hemorrhage with loss of consciousness of unspecified duration, initial encounter: Secondary | ICD-10-CM

## 2013-02-08 DIAGNOSIS — G8190 Hemiplegia, unspecified affecting unspecified side: Secondary | ICD-10-CM | POA: Diagnosis present

## 2013-02-08 DIAGNOSIS — E871 Hypo-osmolality and hyponatremia: Secondary | ICD-10-CM | POA: Diagnosis present

## 2013-02-08 DIAGNOSIS — Z860100 Personal history of colon polyps, unspecified: Secondary | ICD-10-CM

## 2013-02-08 DIAGNOSIS — D4620 Refractory anemia with excess of blasts, unspecified: Secondary | ICD-10-CM | POA: Diagnosis present

## 2013-02-08 DIAGNOSIS — Z87891 Personal history of nicotine dependence: Secondary | ICD-10-CM

## 2013-02-08 DIAGNOSIS — K5730 Diverticulosis of large intestine without perforation or abscess without bleeding: Secondary | ICD-10-CM | POA: Diagnosis present

## 2013-02-08 DIAGNOSIS — I10 Essential (primary) hypertension: Secondary | ICD-10-CM | POA: Diagnosis present

## 2013-02-08 DIAGNOSIS — Z79899 Other long term (current) drug therapy: Secondary | ICD-10-CM

## 2013-02-08 DIAGNOSIS — W19XXXA Unspecified fall, initial encounter: Secondary | ICD-10-CM | POA: Diagnosis present

## 2013-02-08 DIAGNOSIS — M069 Rheumatoid arthritis, unspecified: Secondary | ICD-10-CM | POA: Diagnosis present

## 2013-02-08 DIAGNOSIS — R269 Unspecified abnormalities of gait and mobility: Secondary | ICD-10-CM

## 2013-02-08 DIAGNOSIS — Q068 Other specified congenital malformations of spinal cord: Secondary | ICD-10-CM

## 2013-02-08 DIAGNOSIS — K219 Gastro-esophageal reflux disease without esophagitis: Secondary | ICD-10-CM | POA: Diagnosis present

## 2013-02-08 HISTORY — PX: CRANIOTOMY: SHX93

## 2013-02-08 LAB — CBC WITH DIFFERENTIAL/PLATELET
Basophils Absolute: 0 10*3/uL (ref 0.0–0.1)
Eosinophils Relative: 1 % (ref 0–5)
HCT: 36 % — ABNORMAL LOW (ref 39.0–52.0)
Hemoglobin: 12.2 g/dL — ABNORMAL LOW (ref 13.0–17.0)
Lymphocytes Relative: 22 % (ref 12–46)
Lymphs Abs: 1.7 10*3/uL (ref 0.7–4.0)
MCV: 93.3 fL (ref 78.0–100.0)
Monocytes Absolute: 0.6 10*3/uL (ref 0.1–1.0)
Monocytes Relative: 8 % (ref 3–12)
Neutro Abs: 5.4 10*3/uL (ref 1.7–7.7)
RDW: 15 % (ref 11.5–15.5)
WBC: 7.9 10*3/uL (ref 4.0–10.5)

## 2013-02-08 LAB — COMPREHENSIVE METABOLIC PANEL
BUN: 22 mg/dL (ref 6–23)
CO2: 21 mEq/L (ref 19–32)
Calcium: 8.6 mg/dL (ref 8.4–10.5)
Chloride: 100 mEq/L (ref 96–112)
Creatinine, Ser: 0.91 mg/dL (ref 0.50–1.35)
GFR calc Af Amer: 88 mL/min — ABNORMAL LOW (ref >90)
GFR calc non Af Amer: 76 mL/min — ABNORMAL LOW (ref >90)
Glucose, Bld: 87 mg/dL (ref 70–99)
Total Bilirubin: 0.4 mg/dL (ref 0.3–1.2)

## 2013-02-08 LAB — PROTIME-INR
INR: 1.15 (ref 0.00–1.49)
Prothrombin Time: 14.5 seconds (ref 11.6–15.2)

## 2013-02-08 LAB — APTT: aPTT: 26 seconds (ref 24–37)

## 2013-02-08 LAB — MRSA PCR SCREENING: MRSA by PCR: NEGATIVE

## 2013-02-08 SURGERY — CRANIOTOMY HEMATOMA EVACUATION SUBDURAL
Anesthesia: General | Site: Head | Laterality: Right | Wound class: Clean

## 2013-02-08 MED ORDER — HYDROCODONE-ACETAMINOPHEN 5-325 MG PO TABS
1.0000 | ORAL_TABLET | ORAL | Status: DC | PRN
  Administered 2013-02-09 – 2013-02-11 (×3): 1 via ORAL
  Filled 2013-02-08 (×3): qty 1

## 2013-02-08 MED ORDER — LACTATED RINGERS IV SOLN
INTRAVENOUS | Status: DC
  Administered 2013-02-08: 21:00:00 via INTRAVENOUS

## 2013-02-08 MED ORDER — CEFAZOLIN SODIUM-DEXTROSE 2-3 GM-% IV SOLR
2.0000 g | Freq: Once | INTRAVENOUS | Status: AC
Start: 2013-02-08 — End: 2013-02-08
  Administered 2013-02-08: 2 g via INTRAVENOUS
  Filled 2013-02-08: qty 50

## 2013-02-08 MED ORDER — ENSURE COMPLETE PO LIQD
237.0000 mL | Freq: Two times a day (BID) | ORAL | Status: DC
  Administered 2013-02-09 – 2013-02-11 (×6): 237 mL via ORAL

## 2013-02-08 MED ORDER — BUPIVACAINE-EPINEPHRINE 0.5% -1:200000 IJ SOLN
INTRAMUSCULAR | Status: DC | PRN
  Administered 2013-02-08: 10 mL

## 2013-02-08 MED ORDER — BENEPROTEIN PO POWD
1.0000 | Freq: Two times a day (BID) | ORAL | Status: DC
Start: 2013-02-08 — End: 2013-02-08

## 2013-02-08 MED ORDER — LABETALOL HCL 5 MG/ML IV SOLN
10.0000 mg | INTRAVENOUS | Status: DC | PRN
  Administered 2013-02-08: 10 mg via INTRAVENOUS
  Filled 2013-02-08: qty 4

## 2013-02-08 MED ORDER — FLEET ENEMA 7-19 GM/118ML RE ENEM
1.0000 | ENEMA | Freq: Every day | RECTAL | Status: DC | PRN
  Filled 2013-02-08: qty 1

## 2013-02-08 MED ORDER — HEMOSTATIC AGENTS (NO CHARGE) OPTIME
TOPICAL | Status: DC | PRN
  Administered 2013-02-08: 1 via TOPICAL

## 2013-02-08 MED ORDER — SUCCINYLCHOLINE CHLORIDE 20 MG/ML IJ SOLN
INTRAMUSCULAR | Status: DC | PRN
  Administered 2013-02-08: 100 mg via INTRAVENOUS

## 2013-02-08 MED ORDER — AMLODIPINE BESYLATE 5 MG PO TABS
5.0000 mg | ORAL_TABLET | Freq: Every day | ORAL | Status: DC
  Administered 2013-02-10 – 2013-02-11 (×2): 5 mg via ORAL
  Filled 2013-02-08 (×3): qty 1

## 2013-02-08 MED ORDER — PANTOPRAZOLE SODIUM 40 MG IV SOLR
40.0000 mg | Freq: Every day | INTRAVENOUS | Status: DC
  Administered 2013-02-08 – 2013-02-10 (×3): 40 mg via INTRAVENOUS
  Filled 2013-02-08 (×5): qty 40

## 2013-02-08 MED ORDER — PROPOFOL 10 MG/ML IV BOLUS
INTRAVENOUS | Status: DC | PRN
  Administered 2013-02-08: 50 mg via INTRAVENOUS
  Administered 2013-02-08: 100 mg via INTRAVENOUS
  Administered 2013-02-08: 50 mg via INTRAVENOUS

## 2013-02-08 MED ORDER — BISACODYL 10 MG RE SUPP: 10.0000 mg | Freq: Every day | RECTAL | Status: DC | PRN

## 2013-02-08 MED ORDER — PRO-STAT SUGAR FREE PO LIQD
30.0000 mL | Freq: Every day | ORAL | Status: DC
  Administered 2013-02-09 – 2013-02-11 (×3): 30 mL via ORAL
  Filled 2013-02-08 (×3): qty 30

## 2013-02-08 MED ORDER — PROMETHAZINE HCL 25 MG PO TABS: 12.5000 mg | ORAL_TABLET | ORAL | Status: DC | PRN

## 2013-02-08 MED ORDER — FENTANYL CITRATE 0.05 MG/ML IJ SOLN
INTRAMUSCULAR | Status: DC | PRN
  Administered 2013-02-08 (×4): 50 ug via INTRAVENOUS

## 2013-02-08 MED ORDER — ATORVASTATIN CALCIUM 20 MG PO TABS
20.0000 mg | ORAL_TABLET | Freq: Every day | ORAL | Status: DC
  Administered 2013-02-08 – 2013-02-10 (×3): 20 mg via ORAL
  Filled 2013-02-08 (×4): qty 1

## 2013-02-08 MED ORDER — 0.9 % SODIUM CHLORIDE (POUR BTL) OPTIME
TOPICAL | Status: DC | PRN
  Administered 2013-02-08: 1000 mL

## 2013-02-08 MED ORDER — METOPROLOL SUCCINATE ER 25 MG PO TB24
25.0000 mg | ORAL_TABLET | Freq: Every morning | ORAL | Status: DC
  Administered 2013-02-09 – 2013-02-11 (×3): 25 mg via ORAL
  Filled 2013-02-08 (×3): qty 1

## 2013-02-08 MED ORDER — ACETAMINOPHEN 650 MG RE SUPP: 650.0000 mg | RECTAL | Status: DC | PRN

## 2013-02-08 MED ORDER — PANTOPRAZOLE SODIUM 20 MG PO TBEC
20.0000 mg | DELAYED_RELEASE_TABLET | Freq: Every day | ORAL | Status: DC
  Administered 2013-02-09 – 2013-02-11 (×3): 20 mg via ORAL
  Filled 2013-02-08 (×3): qty 1

## 2013-02-08 MED ORDER — MORPHINE SULFATE 2 MG/ML IJ SOLN
1.0000 mg | INTRAMUSCULAR | Status: DC | PRN
  Administered 2013-02-08 – 2013-02-10 (×3): 2 mg via INTRAVENOUS
  Filled 2013-02-08 (×3): qty 1

## 2013-02-08 MED ORDER — ALPRAZOLAM 0.25 MG PO TABS: 0.2500 mg | ORAL_TABLET | Freq: Three times a day (TID) | ORAL | Status: DC | PRN

## 2013-02-08 MED ORDER — HYDRALAZINE HCL 20 MG/ML IJ SOLN
INTRAMUSCULAR | Status: DC | PRN
  Administered 2013-02-08 (×2): 5 mg via INTRAVENOUS

## 2013-02-08 MED ORDER — SIMVASTATIN 40 MG PO TABS: 40.0000 mg | ORAL_TABLET | Freq: Every day | ORAL | Status: DC

## 2013-02-08 MED ORDER — RA SALINE ENEMA 19-7 GM/118ML RE ENEM: 1.0000 | ENEMA | Freq: Every day | RECTAL | Status: DC | PRN

## 2013-02-08 MED ORDER — VECURONIUM BROMIDE 10 MG IV SOLR
INTRAVENOUS | Status: DC | PRN
  Administered 2013-02-08: 5 mg via INTRAVENOUS

## 2013-02-08 MED ORDER — SODIUM CHLORIDE 0.9 % IR SOLN
Status: DC | PRN
  Administered 2013-02-08: 18:00:00

## 2013-02-08 MED ORDER — ACETAMINOPHEN 325 MG PO TABS: 650.0000 mg | ORAL_TABLET | ORAL | Status: DC | PRN

## 2013-02-08 MED ORDER — LACTATED RINGERS IV SOLN
INTRAVENOUS | Status: DC | PRN
  Administered 2013-02-08: 17:00:00 via INTRAVENOUS

## 2013-02-08 MED ORDER — MEGESTROL ACETATE 400 MG/10ML PO SUSP
400.0000 mg | Freq: Every day | ORAL | Status: DC
  Administered 2013-02-09 – 2013-02-11 (×3): 400 mg via ORAL
  Filled 2013-02-08 (×3): qty 10

## 2013-02-08 MED ORDER — ADULT MULTIVITAMIN W/MINERALS CH
1.0000 | ORAL_TABLET | Freq: Every day | ORAL | Status: DC
  Administered 2013-02-09 – 2013-02-11 (×3): 1 via ORAL
  Filled 2013-02-08 (×3): qty 1

## 2013-02-08 MED ORDER — CEFAZOLIN SODIUM-DEXTROSE 2-3 GM-% IV SOLR
2.0000 g | Freq: Three times a day (TID) | INTRAVENOUS | Status: AC
  Administered 2013-02-09 (×2): 2 g via INTRAVENOUS
  Filled 2013-02-08 (×2): qty 50

## 2013-02-08 MED ORDER — THROMBIN 20000 UNITS EX KIT
PACK | CUTANEOUS | Status: DC | PRN
  Administered 2013-02-08: 18:00:00 via TOPICAL

## 2013-02-08 MED ORDER — LABETALOL HCL 5 MG/ML IV SOLN
INTRAVENOUS | Status: DC | PRN
  Administered 2013-02-08 (×2): 10 mg via INTRAVENOUS
  Administered 2013-02-08: 5 mg via INTRAVENOUS
  Administered 2013-02-08: 2.5 mg via INTRAVENOUS
  Administered 2013-02-08: 5 mg via INTRAVENOUS
  Administered 2013-02-08: 7.5 mg via INTRAVENOUS

## 2013-02-08 MED ORDER — LIDOCAINE HCL (CARDIAC) 20 MG/ML IV SOLN
INTRAVENOUS | Status: DC | PRN
  Administered 2013-02-08: 100 mg via INTRAVENOUS

## 2013-02-08 MED ORDER — SODIUM CHLORIDE 0.9 % IV SOLN
500.0000 mg | Freq: Two times a day (BID) | INTRAVENOUS | Status: DC
  Administered 2013-02-08 – 2013-02-11 (×6): 500 mg via INTRAVENOUS
  Filled 2013-02-08 (×8): qty 5

## 2013-02-08 MED ORDER — ONDANSETRON HCL 4 MG PO TABS: 4.0000 mg | ORAL_TABLET | ORAL | Status: DC | PRN

## 2013-02-08 MED ORDER — PHENYLEPHRINE HCL 10 MG/ML IJ SOLN
10.0000 mg | INTRAVENOUS | Status: DC | PRN
  Administered 2013-02-08: 40 ug/min via INTRAVENOUS

## 2013-02-08 MED ORDER — CITALOPRAM HYDROBROMIDE 10 MG PO TABS
10.0000 mg | ORAL_TABLET | Freq: Every day | ORAL | Status: DC
  Administered 2013-02-09 – 2013-02-11 (×3): 10 mg via ORAL
  Filled 2013-02-08 (×3): qty 1

## 2013-02-08 MED ORDER — VALACYCLOVIR HCL 500 MG PO TABS
1000.0000 mg | ORAL_TABLET | Freq: Every day | ORAL | Status: DC
  Administered 2013-02-08 – 2013-02-11 (×4): 1000 mg via ORAL
  Filled 2013-02-08 (×5): qty 2

## 2013-02-08 MED ORDER — FOLIC ACID 1 MG PO TABS
2.0000 mg | ORAL_TABLET | Freq: Every day | ORAL | Status: DC
  Administered 2013-02-09 – 2013-02-11 (×3): 2 mg via ORAL
  Filled 2013-02-08 (×3): qty 2

## 2013-02-08 MED ORDER — ONDANSETRON HCL 4 MG/2ML IJ SOLN: 4.0000 mg | INTRAMUSCULAR | Status: DC | PRN

## 2013-02-08 MED ORDER — SODIUM CHLORIDE 0.9 % IV SOLN
INTRAVENOUS | Status: DC | PRN
  Administered 2013-02-08: 19:00:00 via INTRAVENOUS

## 2013-02-08 SURGICAL SUPPLY — 69 items
BAG DECANTER FOR FLEXI CONT (MISCELLANEOUS) IMPLANT
BIT DRILL WIRE PASS 1.3MM (BIT) IMPLANT
BLADE CLIPPER SURG NEURO (BLADE) ×2 IMPLANT
BRUSH SCRUB EZ 1% IODOPHOR (MISCELLANEOUS) ×2 IMPLANT
BRUSH SCRUB EZ PLAIN DRY (MISCELLANEOUS) ×2 IMPLANT
BUR ACORN 6.0 PRECISION (BURR) ×2 IMPLANT
BUR ROUTER D-58 CRANI (BURR) IMPLANT
CANISTER SUCTION 2500CC (MISCELLANEOUS) ×2 IMPLANT
CLIP TI MEDIUM 6 (CLIP) IMPLANT
CLOTH BEACON ORANGE TIMEOUT ST (SAFETY) ×2 IMPLANT
CONT SPEC 4OZ CLIKSEAL STRL BL (MISCELLANEOUS) ×2 IMPLANT
CORDS BIPOLAR (ELECTRODE) ×2 IMPLANT
COVER TABLE BACK 60X90 (DRAPES) IMPLANT
DRAIN SNY WOU 7FLT (WOUND CARE) IMPLANT
DRAPE NEUROLOGICAL W/INCISE (DRAPES) ×2 IMPLANT
DRAPE SURG 17X23 STRL (DRAPES) IMPLANT
DRAPE WARM FLUID 44X44 (DRAPE) ×2 IMPLANT
DRESSING TELFA 8X3 (GAUZE/BANDAGES/DRESSINGS) ×2 IMPLANT
DRILL WIRE PASS 1.3MM (BIT)
ELECT CAUTERY BLADE 6.4 (BLADE) ×2 IMPLANT
ELECT REM PT RETURN 9FT ADLT (ELECTROSURGICAL) ×2
ELECTRODE REM PT RTRN 9FT ADLT (ELECTROSURGICAL) ×1 IMPLANT
EVACUATOR 1/8 PVC DRAIN (DRAIN) IMPLANT
EVACUATOR SILICONE 100CC (DRAIN) IMPLANT
GAUZE SPONGE 4X4 16PLY XRAY LF (GAUZE/BANDAGES/DRESSINGS) IMPLANT
GLOVE BIO SURGEON STRL SZ 6.5 (GLOVE) ×2 IMPLANT
GLOVE BIO SURGEON STRL SZ7 (GLOVE) ×2 IMPLANT
GLOVE BIO SURGEON STRL SZ8.5 (GLOVE) ×2 IMPLANT
GLOVE EXAM NITRILE LRG STRL (GLOVE) IMPLANT
GLOVE EXAM NITRILE MD LF STRL (GLOVE) IMPLANT
GLOVE EXAM NITRILE XL STR (GLOVE) IMPLANT
GLOVE EXAM NITRILE XS STR PU (GLOVE) IMPLANT
GLOVE SS BIOGEL STRL SZ 8 (GLOVE) ×1 IMPLANT
GLOVE SUPERSENSE BIOGEL SZ 8 (GLOVE) ×1
GOWN BRE IMP SLV AUR LG STRL (GOWN DISPOSABLE) IMPLANT
GOWN BRE IMP SLV AUR XL STRL (GOWN DISPOSABLE) IMPLANT
KIT BASIN OR (CUSTOM PROCEDURE TRAY) ×2 IMPLANT
KIT ROOM TURNOVER OR (KITS) ×2 IMPLANT
MARKER SKIN DUAL TIP RULER LAB (MISCELLANEOUS) IMPLANT
NEEDLE HYPO 22GX1.5 SAFETY (NEEDLE) ×2 IMPLANT
NS IRRIG 1000ML POUR BTL (IV SOLUTION) ×2 IMPLANT
PACK CRANIOTOMY (CUSTOM PROCEDURE TRAY) ×2 IMPLANT
PAD ARMBOARD 7.5X6 YLW CONV (MISCELLANEOUS) ×2 IMPLANT
PATTIES SURGICAL .25X.25 (GAUZE/BANDAGES/DRESSINGS) IMPLANT
PATTIES SURGICAL .5 X.5 (GAUZE/BANDAGES/DRESSINGS) IMPLANT
PATTIES SURGICAL .5 X3 (DISPOSABLE) IMPLANT
PATTIES SURGICAL 1X1 (DISPOSABLE) IMPLANT
PIN MAYFIELD SKULL DISP (PIN) IMPLANT
PLATE 1.5  2HOLE LNG NEURO (Plate) ×3 IMPLANT
PLATE 1.5 2HOLE LNG NEURO (Plate) ×3 IMPLANT
RUBBERBAND STERILE (MISCELLANEOUS) ×4 IMPLANT
SCREW SELF DRILL HT 1.5/4MM (Screw) ×12 IMPLANT
SPONGE GAUZE 4X4 12PLY (GAUZE/BANDAGES/DRESSINGS) ×2 IMPLANT
SPONGE NEURO XRAY DETECT 1X3 (DISPOSABLE) IMPLANT
STAPLER SKIN PROX WIDE 3.9 (STAPLE) ×2 IMPLANT
SUT ETHILON 3 0 FSL (SUTURE) IMPLANT
SUT NURALON 4 0 TR CR/8 (SUTURE) ×4 IMPLANT
SUT PROLENE 6 0 BV (SUTURE) IMPLANT
SUT VIC AB 2-0 CP2 18 (SUTURE) ×2 IMPLANT
SUT VIC AB 3-0 FS2 27 (SUTURE) ×2 IMPLANT
SUT VICRYL 4-0 PS2 18IN ABS (SUTURE) IMPLANT
SYR 20ML ECCENTRIC (SYRINGE) ×2 IMPLANT
SYR CONTROL 10ML LL (SYRINGE) ×2 IMPLANT
TAPE CLOTH SURG 4X10 WHT LF (GAUZE/BANDAGES/DRESSINGS) ×2 IMPLANT
TOWEL OR 17X24 6PK STRL BLUE (TOWEL DISPOSABLE) ×2 IMPLANT
TOWEL OR 17X26 10 PK STRL BLUE (TOWEL DISPOSABLE) ×2 IMPLANT
TRAY FOLEY CATH 14FRSI W/METER (CATHETERS) ×2 IMPLANT
UNDERPAD 30X30 INCONTINENT (UNDERPADS AND DIAPERS) IMPLANT
WATER STERILE IRR 1000ML POUR (IV SOLUTION) ×2 IMPLANT

## 2013-02-08 NOTE — ED Notes (Signed)
Per ems pt is from [facility]. Staff called ems due to pt having some weakness on left side since Tuesday. Can raise arms and legs and strong grips, no stroke s/s. Hx of multiple melanoma, md saw pt at facility this morning and dx with multiple melanoma without remission. Pt had DNR. Pt denies pain. Denies n/v.

## 2013-02-08 NOTE — ED Notes (Signed)
md at bedside

## 2013-02-08 NOTE — Anesthesia Preprocedure Evaluation (Addendum)
Anesthesia Evaluation  Patient identified by MRN, date of birth, ID band Patient awake    Reviewed: Allergy & Precautions, H&P , NPO status , Patient's Chart, lab work & pertinent test results, reviewed documented beta blocker date and time   Airway Mallampati: II TM Distance: >3 FB Neck ROM: Limited    Dental  (+) Teeth Intact and Dental Advisory Given   Pulmonary COPDformer smoker,  + rhonchi         Cardiovascular Rhythm:Regular Rate:Normal     Neuro/Psych Generalized weakness Subdural dx 11/2012-no surgery after fall    GI/Hepatic hiatal hernia, GERD-  Controlled and Medicated,H/o ca colon esophagitis   Endo/Other    Renal/GU      Musculoskeletal  (+) Arthritis -,   Abdominal   Peds  Hematology   Anesthesia Other Findings Multiple myeloma cachectic  Reproductive/Obstetrics                         Anesthesia Physical Anesthesia Plan  ASA: IV and emergent  Anesthesia Plan: General   Post-op Pain Management:    Induction: Intravenous, Rapid sequence and Cricoid pressure planned  Airway Management Planned: Oral ETT  Additional Equipment: Arterial line  Intra-op Plan:   Post-operative Plan: Possible Post-op intubation/ventilation  Informed Consent: I have reviewed the patients History and Physical, chart, labs and discussed the procedure including the risks, benefits and alternatives for the proposed anesthesia with the patient or authorized representative who has indicated his/her understanding and acceptance.     Plan Discussed with: CRNA and Surgeon  Anesthesia Plan Comments:         Anesthesia Quick Evaluation

## 2013-02-08 NOTE — Anesthesia Postprocedure Evaluation (Signed)
  Anesthesia Post-op Note  Patient: Shawn Chen  Procedure(s) Performed: Procedure(s): Right CRANIOTOMY for HEMATOMA EVACUATION SUBDURAL (Right)  Patient Location: PACU  Anesthesia Type:General  Level of Consciousness: awake  Airway and Oxygen Therapy: Patient Spontanous Breathing  Post-op Pain: mild  Post-op Assessment: Post-op Vital signs reviewed, Patient's Cardiovascular Status Stable, Respiratory Function Stable, Patent Airway, No signs of Nausea or vomiting and Pain level controlled  Post-op Vital Signs: stable  Complications: No apparent anesthesia complications

## 2013-02-08 NOTE — Anesthesia Procedure Notes (Signed)
Procedure Name: Intubation Date/Time: 02/08/2013 5:26 PM Performed by: Alanda Amass A Pre-anesthesia Checklist: Patient identified, Timeout performed, Emergency Drugs available, Suction available and Patient being monitored Patient Re-evaluated:Patient Re-evaluated prior to inductionOxygen Delivery Method: Circle system utilized Preoxygenation: Pre-oxygenation with 100% oxygen Intubation Type: IV induction, Rapid sequence and Cricoid Pressure applied Tube type: Subglottic suction tube Tube size: 8.5 mm Number of attempts: 1 Airway Equipment and Method: Video-laryngoscopy Placement Confirmation: ETT inserted through vocal cords under direct vision,  breath sounds checked- equal and bilateral and positive ETCO2 Secured at: 22 cm Tube secured with: Tape Dental Injury: Teeth and Oropharynx as per pre-operative assessment

## 2013-02-08 NOTE — Transfer of Care (Signed)
Immediate Anesthesia Transfer of Care Note  Patient: Shawn Chen  Procedure(s) Performed: Procedure(s): Right CRANIOTOMY for HEMATOMA EVACUATION SUBDURAL (Right)  Patient Location: NICU  Anesthesia Type:General  Level of Consciousness: sedated and responds to stimulation  Airway & Oxygen Therapy: Patient Spontanous Breathing and Patient connected to nasal cannula oxygen  Post-op Assessment: Report given to PACU RN and Post -op Vital signs reviewed and stable  Post vital signs: Reviewed and stable  Complications: No apparent anesthesia complications

## 2013-02-08 NOTE — Preoperative (Signed)
Beta Blockers   Reason not to administer Beta Blockers:will evaluate pt. status thru procedure for need for beta blockers

## 2013-02-08 NOTE — Op Note (Signed)
Brief history: The patient is to 79 year old white male who has had a history of a small right subdural hematoma. He has become progressively more weak on the left and cannot ambulate. He was worked up with a head CT today which demonstrated a large right subdural hematoma. He was transferred to [facility]. I discussed the various treatment options with the patient including surgery. The patient has weighed the risks, benefits, and alternatives surgery and decided proceed with a right craniotomy to evacuate a subdural hematoma.  Preop diagnosis: Right chronic subdural hematoma  Postop diagnosis: The same  Procedure: Right craniotomy for evacuation of subdural hematoma  Surgeon: Dr. Delma Officer  Asst.: None  Anesthesia: Gen. endotracheal  Estimated blood loss: Minimal  Specimens: None  Drains: One subdural 10 mm flat Jackson-Pratt drain  Complications: None  Description of procedure: The patient was brought to the operating room by the anesthesia team. General endotracheal anesthesia was induced. The patient remained in the supine position. A roll was placed under his right shoulder. The patient's calvarium was supported in the [place] 3 point headrest. His head was turned slightly to the left exposing his right scalp. His scalp was then shaved with clippers and prepared with Betadine scrub and Betadine solution. Sterile drapes were applied. I then injected the area to be incised with Marcaine with epinephrine solution. I scalpel to make a linear incision in the patient's right scalp. We used Raney clips wound edge hemostasis. We inserted the cerebellar retractor for exposure. I used the electrocautery to divide the temporalis fascia and muscle we used periosteal elevators to expose the underlying calvarium. I then used a high-speed drill to create burr hole. It created a small craniotomy flap with the foot plate device. We elevated the craniotomy flap with the Field #3. This exposed  the underlying dura. I incised the dura in a cruciate fashion. We tacked epidural edges. We encountered a superficial subdural membrane I cut with a scissors releasing a chronic subdural hematoma under high pressure. I then irrigated out the subdural space. We encountered a second subdural membrane just over the brain. I coagulated this and then removed the section communicating the subdural spaces. We irrigated the wound out with saline solution until the uric piggyback Russell clear. We did not encounter any active bleeding points. I then placed a 10 mm flat Jackson-Pratt drain in the subdural space. We tunneled it out through separate stab wound. I then reapproximated patient's dura with interrupted 4-0 nylon suture. We then laid a piece of Gelfoam over the exposed dura. I then reapproximated patient's craniotomy flap with titanium mini plates and screws. We then removed the retractor. I then reapproximated the patient's galea with interrupted 2-0 Vicryl suture. I reapproximated the skin with stainless steel staples. The wound is then coated with bacitracin ointment. A sterile dressing was applied. The drapes were removed. I then removed the [place]field 3 point headrest. The patient was subsequently extubated by the anesthesia team and transported to the post anesthesia care unit in stable condition by report all sponge instrument and needle counts were correct at the end of this case.

## 2013-02-08 NOTE — ED Notes (Signed)
PA at bedside.

## 2013-02-08 NOTE — ED Provider Notes (Signed)
CSN: [id number]     Arrival date & time 02/08/13  1237 History     First MD Initiated Contact with Patient 02/08/13 1301     Chief Complaint  Patient presents with  . Weakness  . cancer pt    (Consider location/radiation/quality/duration/timing/severity/associated sxs/prior Treatment) HPI Comments: Pt and wife state that in the last week pt has progressively gotten more week:pt was placed in rehab recently because of generalized weakness which was suspected to be related to his chemo from his multiple myeloma:pt has stopped all treatment for this because he is to weak:pt was able to ambulate with a walker at the beginning of the weak and now can't sit up without assistance:pt denies pain anywhere at this time:pt denies fever, cough, chest pain, sob,n/v/d:wife also states that he has tremors intermittenty  The history is provided by the patient. No language interpreter was used.    Past Medical History  Diagnosis Date  . Diverticulosis of colon (without mention of hemorrhage)   . Esophagitis, unspecified   . 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 Personal history of colonic polyps   . Reflux   . Hernia   . RA (rheumatoid arthritis)   . Myeloma   . Multiple myeloma, without mention of having achieved remission(203.00) 01/05/2013  . Subdural hematoma 6/14    secondary to fall   Past Surgical History  Procedure Laterality Date  . Inguinal hernia repair  03/28/1990    dr price  . Orif hip fracture     Family History  Problem Relation Age of Onset  . Colon cancer Neg Hx    History  Substance Use Topics  . Smoking status: Former Smoker    Quit date: 06/18/1990  . Smokeless tobacco: Never Used  . Alcohol Use: No    Review of Systems  Constitutional: Negative.   Respiratory: Negative.   Cardiovascular: Negative.     Allergies  Review of patient's allergies indicates no known allergies.  Home Medications   Current Outpatient Rx  Name  Route  Sig   Dispense  Refill  . alendronate (FOSAMAX) 70 MG tablet   Oral   Take 70 mg by mouth every 7 (seven) days. Take with a full glass of water on an empty stomach.         . ALPRAZolam (XANAX) 0.25 MG tablet   Oral   Take 1 tablet (0.25 mg total) by mouth every 8 (eight) hours.   90 tablet   5   . amLODipine (NORVASC) 5 MG tablet   Oral   Take 1 tablet (5 mg total) by mouth daily.   30 tablet   6   . aspirin EC 81 MG tablet   Oral   Take 162 mg by mouth daily.         . citalopram (CELEXA) 10 MG tablet   Oral   Take 10 mg by mouth daily.          . famciclovir (FAMVIR) 500 MG tablet   Oral   Take 250 mg by mouth at bedtime.         . feeding supplement (ENSURE COMPLETE) LIQD   Oral   Take 237 mLs by mouth 2 (two) times daily between meals.   60 Bottle   3   . folic acid (FOLVITE) 1 MG tablet   Oral   Take 2 tablets (2 mg total) by mouth daily.   30 tablet   6   . Lenalidomide (REVLIMID)  20 MG CAPS   Oral   Take 20 mg by mouth daily. Take 1 capsule by mouth daily x 21 days. Authorization # [id number]   21 capsule   0   . loperamide (IMODIUM) 2 MG capsule   Oral   Take 1 capsule (2 mg total) by mouth as needed for diarrhea or loose stools.   30 capsule   0   . megestrol (MEGACE) 400 MG/10ML suspension   Oral   Take 10 mLs (400 mg total) by mouth daily.   240 mL   0   . metoprolol succinate (TOPROL-XL) 50 MG 24 hr tablet   Oral   Take 25 mg by mouth every morning. Take with or immediately following a meal.         . Multiple Vitamin (MULTIVITAMIN WITH MINERALS) TABS   Oral   Take 1 tablet by mouth daily.         . ondansetron (ZOFRAN-ODT) 8 MG disintegrating tablet   Oral   Take 1 tablet (8 mg total) by mouth every 12 (twelve) hours as needed for nausea.   20 tablet   0   . pantoprazole (PROTONIX) 20 MG tablet   Oral   Take 20 mg by mouth daily.         . polyethylene glycol (MIRALAX / GLYCOLAX) packet   Oral   Take 17 g by mouth  daily.   5 each   0   . simvastatin (ZOCOR) 40 MG tablet   Oral   Take 40 mg by mouth daily.           . tamsulosin (FLOMAX) 0.4 MG CAPS   Oral   Take 0.4 mg by mouth daily.         [name] trolamine salicylate (ASPERCREME) 10 % cream   Topical   Apply topically 2 (two) times daily as needed.   85 g   0    BP 113/61  Pulse 80  Temp(Src) 97.7 F (36.5 C) (Oral)  Resp 16  SpO2 96% Physical Exam  Nursing note and vitals reviewed. Constitutional: He is oriented to person, place, and time. He appears well-developed and well-nourished.  HENT:  Head: Normocephalic and atraumatic.  Eyes: Conjunctivae and EOM are normal.  Neck: Normal range of motion. Neck supple.  Cardiovascular: Normal rate and regular rhythm.   Pulmonary/Chest: Effort normal and breath sounds normal.  Abdominal: Soft. Bowel sounds are normal. There is no tenderness.  Musculoskeletal: Normal range of motion.  Neurological: He is alert and oriented to person, place, and time.  Intermittent tremor noted to extremities:pt unable to sit up on his own without assistance:no focal deficit noted  Skin: Skin is warm and dry.    ED Course   Procedures (including critical care time)  Labs Reviewed  CBC WITH DIFFERENTIAL - Abnormal; Notable for the following:    RBC 3.86 (*)    Hemoglobin 12.2 (*)    HCT 36.0 (*)    All other components within normal limits  COMPREHENSIVE METABOLIC PANEL - Abnormal; Notable for the following:    Sodium 131 (*)    Albumin 2.6 (*)    GFR calc non Af Amer 76 (*)    GFR calc Af Amer 88 (*)    All other components within normal limits  PROTIME-INR  APTT  URINALYSIS, ROUTINE W REFLEX MICROSCOPIC   Dg Chest 2 View  02/08/2013   *RADIOLOGY REPORT*  Clinical Data: Generalized weakness.  Admission.  Current history multiple  myeloma.  CHEST - 2 VIEW  Comparison: One-view chest x-ray 01/22/2013, 01/16/2013, 11/24/2012, 01/17/2010.  Findings: Cardiac silhouette moderately enlarged but  stable.  Small to moderate sized hiatal hernia, unchanged.  Hilar and mediastinal contours otherwise unremarkable.  Pulmonary vascularity normal. Emphysematous changes in the upper lobes.  Probable calcified granuloma or hamartoma in the right mid lung on the PA image, likely the right middle lobe, unchanged since the June, 2014 examination.  No new pulmonary parenchymal abnormalities.  No pleural effusions.  Degenerative changes involving the thoracic spine and generalized osseous demineralization.  Old compression fracture of T12.  IMPRESSION: COPD/emphysema.  No acute cardiopulmonary disease.  Stable cardiomegaly.  Stable hiatal hernia.  Old compression fracture of T12.   Original Report Authenticated By: Hulan Saas, M.D.   Ct Head Wo Contrast  02/08/2013   *RADIOLOGY REPORT*  Clinical Data:  Increased weakness, tremors, history multiple myeloma, melanoma, left side weakness since Tuesday  CT HEAD WITHOUT CONTRAST  Technique:  Contiguous axial images were obtained from the base of the skull through the vertex without contrast.  Comparison: 11/22/2012  Findings: Generalized atrophy. Large low attenuation right frontoparietal subdural collection measuring up to 2.9 cm thick consistent with a chronic subdural hematoma. Significant mass effect upon right hemisphere with sulcal effacement, hemisphere compression, and 7 mm of right-to-left midline shift. Small vessel chronic ischemic changes of deep cerebral white matter. No definite intracranial hemorrhage, mass, or evidence of acute infarction. No high attenuation component to the above the subdural collection is identified. Atherosclerotic calcifications at skull base. Bones and sinuses unremarkable. Small bilateral mastoid effusions.  IMPRESSION: Large right frontoparietal chronic subdural hematoma 2.9 cm in thickness causing significant mass effect upon the right hemisphere and 7 mm of right-to-left midline shift.  Critical Value/emergent results were  called by telephone at the time of interpretation on 02/08/2013 at 1400 hours to V. Janith Nielson, who verbally acknowledged these results.   Original Report Authenticated By: Ulyses Southward, M.D.   1. Subdural hematoma     MDM  Pt accepted by Dr.jenkins and he is to go to surgery:pt and wife in agreement that the want the surgery  Teressa Lower, NP 02/08/13 [phone]

## 2013-02-08 NOTE — H&P (Signed)
Subjective: Is to 79 year old white male who has multiple medical problems. Evidently he had a head CT in June of 2014 which demonstrated a small right subdural hematoma. This was treated without surgery.  More recently the patient has become progressively more left hemiparetic and unable to walk. He was brought to the emergency department at [facility] and worked up with a head CT. This demonstrated a large right actual fluid collection consistent with a subdural hematoma. Situation was discussed with the patient and his wife. He has elected to proceed with surgery. He was subsequently transferred to [facility].  Presently the patient is alert and pleasant. His wife is not available. He states he is weak on the left and not able to walk.   Past Medical History  Diagnosis Date  . Diverticulosis of colon (without mention of hemorrhage)   . Esophagitis, unspecified   . 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 Personal history of colonic polyps   . Reflux   . Hernia   . RA (rheumatoid arthritis)   . Myeloma   . Multiple myeloma, without mention of having achieved remission(203.00) 01/05/2013  . Subdural hematoma 6/14    secondary to fall    Past Surgical History  Procedure Laterality Date  . Inguinal hernia repair  03/28/1990    dr price  . Orif hip fracture      No Known Allergies  History  Substance Use Topics  . Smoking status: Former Smoker    Quit date: 06/18/1990  . Smokeless tobacco: Never Used  . Alcohol Use: No    Family History  Problem Relation Age of Onset  . Colon cancer Neg Hx    Prior to Admission medications   Medication Sig Start Date End Date Taking? Authorizing Provider  alendronate (FOSAMAX) 70 MG tablet Take 70 mg by mouth every 7 (seven) days. Take with a full glass of water on an empty stomach.   Yes Historical Provider, MD  ALPRAZolam (XANAX) 0.25 MG tablet Take 0.25 mg by mouth 3 (three) times daily as needed for anxiety.  02/06/13  Yes Tiffany L Reed, DO  amLODipine (NORVASC) 5 MG tablet Take 1 tablet (5 mg total) by mouth daily. 01/26/13  Yes [name], MD  aspirin EC 81 MG tablet Take 162 mg by mouth daily.   Yes Historical Provider, MD  bisacodyl (DULCOLAX) 10 MG suppository Place 10 mg rectally daily as needed for constipation.   Yes Historical Provider, MD  citalopram (CELEXA) 10 MG tablet Take 10 mg by mouth daily.  11/12/12  Yes Historical Provider, MD  famciclovir (FAMVIR) 500 MG tablet Take 250 mg by mouth at bedtime.   Yes Historical Provider, MD  feeding supplement (ENSURE COMPLETE) LIQD Take 237 mLs by mouth 2 (two) times daily between meals. 01/26/13  Yes W Buren Kos, MD  folic acid (FOLVITE) 1 MG tablet Take 2 tablets (2 mg total) by mouth daily. 01/26/13  Yes W Buren Kos, MD  loperamide (IMODIUM) 2 MG capsule Take 1 capsule (2 mg total) by mouth as needed for diarrhea or loose stools. 01/26/13  Yes W Buren Kos, MD  magnesium hydroxide (MILK OF MAGNESIA) 400 MG/5ML suspension Take 30 mLs by mouth daily as needed for constipation.   Yes Historical Provider, MD  megestrol (MEGACE) 400 MG/10ML suspension Take 10 mLs (400 mg total) by mouth daily. 01/26/13  Yes [name], MD  metoprolol succinate (TOPROL-XL) 50 MG 24 hr tablet Take 25 mg  by mouth every morning. Take with or immediately following a meal.   Yes Historical Provider, MD  Multiple Vitamin (MULTIVITAMIN WITH MINERALS) TABS Take 1 tablet by mouth daily.   Yes Historical Provider, MD  ondansetron (ZOFRAN-ODT) 8 MG disintegrating tablet Take 1 tablet (8 mg total) by mouth every 12 (twelve) hours as needed for nausea. 01/16/13  Yes [name]. [name], MD  pantoprazole (PROTONIX) 20 MG tablet Take 20 mg by mouth daily.   Yes Historical Provider, MD  polyethylene glycol (MIRALAX / GLYCOLAX) packet Take 17 g by mouth daily. 01/18/13  Yes [name], MD  protein supplement (RESOURCE BENEPROTEIN) POWD Take 1 scoop by mouth 2 (two) times daily.   Yes  Historical Provider, MD  simvastatin (ZOCOR) 40 MG tablet Take 40 mg by mouth daily.     Yes Historical Provider, MD  Sodium Phosphates (RA SALINE ENEMA) 19-7 GM/118ML ENEM Place 1 Bottle rectally daily as needed (for constipation).   Yes Historical Provider, MD  tamsulosin (FLOMAX) 0.4 MG CAPS Take 0.4 mg by mouth daily.   Yes Historical Provider, MD     Review of Systems  Positive ROS: As above  All other systems have been reviewed and were otherwise negative with the exception of those mentioned in the HPI and as above.  Objective: Vital signs in last 24 hours: Temp:  [97.7 F (36.5 C)] 97.7 F (36.5 C) (08/24 1253) Pulse Rate:  [72-80] 72 (08/24 1500) Resp:  [16-19] 19 (08/24 1500) BP: (111-113)/(61-63) 111/63 mmHg (08/24 1500) SpO2:  [96 %] 96 % (08/24 1253)  General Appearance: Alert, cooperative, no distress, appears stated age. The patient presents in a stretcher in the holding room. Head: Normocephalic, without obvious abnormality, atraumatic Eyes: PERRL, conjunctiva/corneas clear, EOM's intact, fundi benign, both eyes      Ears: and external ear canals, both ears Throat: Lips, mucosa, and tongue normal; teeth and gums normal Neck: Supple, symmetrical, trachea midline, no adenopathy; thyroid: No enlargement/tenderness/nodules; no carotid bruit or JVD Back: Symmetric, no curvature, ROM normal, no CVA tenderness Lungs: Clear to auscultation bilaterally, respirations unlabored Heart: Regular rate and rhythm, S1 and S2 normal, no murmur, rub or gallop Abdomen: Soft, non-tender, bowel sounds active all four quadrants, no masses, no organomegaly Extremities: Extremities normal, atraumatic, no cyanosis or edema Pulses: 2+ and symmetric all extremities Skin: Skin color, texture, turgor normal, no rashes or lesions  NEUROLOGIC:   Mental status: alert and oriented, no aphasia, good attention span, Fund of knowledge/ memory ok Motor Exam -the patient's motor strength is grossly  normal on the right. He is left hemiparetic with proximally 4/5 strength throughout. Sensory Exam - grossly normal Reflexes: Symmetric Coordination - grossly normal on the right, he has some difficulty with rapid alternating movements on the left. Gait -not tested Balance -not tested Cranial Nerves: I: smell Not tested  II: visual acuity  OS: Normal    OD: Normal   II: visual fields Full to confrontation  II: pupils Equal, round, reactive to light  III,VII: ptosis None  III,IV,VI: extraocular muscles  Full ROM  V: mastication Normal  V: facial light touch sensation  Normal  V,VII: corneal reflex  Present  VII: facial muscle function - upper  Normal  VII: facial muscle function - lower Normal  VIII: hearing Not tested  IX: soft palate elevation  Normal  IX,X: gag reflex Present  XI: trapezius strength  5/5  XI: sternocleidomastoid strength 5/5  XI: neck flexion strength  5/5  XII: tongue  strength  Normal    Data Review Lab Results  Component Value Date   WBC 7.9 02/08/2013   HGB 12.2* 02/08/2013   HCT 36.0* 02/08/2013   MCV 93.3 02/08/2013   PLT 357 02/08/2013   Lab Results  Component Value Date   NA 131* 02/08/2013   K 4.2 02/08/2013   CL 100 02/08/2013   CO2 21 02/08/2013   BUN 22 02/08/2013   CREATININE 0.91 02/08/2013   GLUCOSE 87 02/08/2013   Lab Results  Component Value Date   INR 1.15 02/08/2013    Assessment/Plan: Right extra-axial fluid collection: The CT scan is consistent with a right subdural hematoma. I discussed situation with the patient. We have discussed the various treatment options including doing nothing versus surgery. I described the surgical treatment option of a right craniotomy for evacuation of subdural hematoma. We have discussed the risks including risk of anesthesia, hemorrhage, infection, seizures, stroke, coma, death, recurrent subdural hematoma, infection, etc. I have answered all the patient's questions. He has decided to proceed with  surgery.  Multiple medical problems: I will asked the hospitalist to see the patient tomorrow.   Pearlena Ow D 02/08/2013 5:05 PM

## 2013-02-08 NOTE — Progress Notes (Signed)
Patient ID: Shawn Chen, male   DOB: 31-Oct-1926, 79 y.o.   MRN: [id number] Subjective:  The patient is somnolent but easily arousable. He responds appropriately. He is in no apparent distress.  Objective: Vital signs in last 24 hours: Temp:  [97.7 F (36.5 C)] 97.7 F (36.5 C) (08/24 1253) Pulse Rate:  [72-80] 72 (08/24 1500) Resp:  [16-19] 19 (08/24 1500) BP: (111-113)/(61-63) 111/63 mmHg (08/24 1500) SpO2:  [96 %] 96 % (08/24 1253)  Intake/Output from previous day:   Intake/Output this shift:    Physical exam the patient is somnolent but easily arousable. He is follow commands. He remains left hemiparetic.  Lab Results:  Recent Labs  02/08/13 1350  WBC 7.9  HGB 12.2*  HCT 36.0*  PLT 357   BMET  Recent Labs  02/08/13 1350  NA 131*  K 4.2  CL 100  CO2 21  GLUCOSE 87  BUN 22  CREATININE 0.91  CALCIUM 8.6    Studies/Results: Dg Chest 2 View  02/08/2013   *RADIOLOGY REPORT*  Clinical Data: Generalized weakness.  Admission.  Current history multiple myeloma.  CHEST - 2 VIEW  Comparison: One-view chest x-ray 01/22/2013, 01/16/2013, 11/24/2012, 01/17/2010.  Findings: Cardiac silhouette moderately enlarged but stable.  Small to moderate sized hiatal hernia, unchanged.  Hilar and mediastinal contours otherwise unremarkable.  Pulmonary vascularity normal. Emphysematous changes in the upper lobes.  Probable calcified granuloma or hamartoma in the right mid lung on the PA image, likely the right middle lobe, unchanged since the June, 2014 examination.  No new pulmonary parenchymal abnormalities.  No pleural effusions.  Degenerative changes involving the thoracic spine and generalized osseous demineralization.  Old compression fracture of T12.  IMPRESSION: COPD/emphysema.  No acute cardiopulmonary disease.  Stable cardiomegaly.  Stable hiatal hernia.  Old compression fracture of T12.   Original Report Authenticated By: Hulan Saas, M.D.   Ct Head Wo Contrast  02/08/2013    *RADIOLOGY REPORT*  Clinical Data:  Increased weakness, tremors, history multiple myeloma, melanoma, left side weakness since Tuesday  CT HEAD WITHOUT CONTRAST  Technique:  Contiguous axial images were obtained from the base of the skull through the vertex without contrast.  Comparison: 11/22/2012  Findings: Generalized atrophy. Large low attenuation right frontoparietal subdural collection measuring up to 2.9 cm thick consistent with a chronic subdural hematoma. Significant mass effect upon right hemisphere with sulcal effacement, hemisphere compression, and 7 mm of right-to-left midline shift. Small vessel chronic ischemic changes of deep cerebral white matter. No definite intracranial hemorrhage, mass, or evidence of acute infarction. No high attenuation component to the above the subdural collection is identified. Atherosclerotic calcifications at skull base. Bones and sinuses unremarkable. Small bilateral mastoid effusions.  IMPRESSION: Large right frontoparietal chronic subdural hematoma 2.9 cm in thickness causing significant mass effect upon the right hemisphere and 7 mm of right-to-left midline shift.  Critical Value/emergent results were called by telephone at the time of interpretation on 02/08/2013 at 1400 hours to V. Pickering, who verbally acknowledged these results.   Original Report Authenticated By: Ulyses Southward, M.D.    Assessment/Plan: The patient is doing well.  LOS: 0 days     Quint Chestnut D 02/08/2013, 7:11 PM

## 2013-02-08 NOTE — ED Notes (Signed)
Darral Dash  [phone]

## 2013-02-08 NOTE — Progress Notes (Signed)
Date: 02/08/2013  MRN:  [id number] Name:  Shawn Chen Sex:  male Age:  79 y.o. DOB:1926/09/16   PSC #:                       Facility/Room; [facility] Level Of Care: Provider:  Leanord Hawking  Emergency Contacts: Contact Information   Name Relation Home Work Mobile   Galloway Spouse [phone]  [phone]   Whitaker, Holderman Daughter   [phone]   Gerrie Nordmann Daughter   [phone]      HPI: I was called yesterday on called to report a tremendous change in this man's functional level over this past week. I am here in the facility reviewing this today. He is a complex hsitory with IgG multiple myeloma that has been recently diagnosed. he has been in the hospital 2 times with fecal impaction and more weak recently generalized weakness with failure to thrive. He is here for rehabilitation for this. With regards to his multiple myeloma this was just diagnosed in July 2014. He had a bone marrow biopsy performed that showed a hypercellular marrow with 17% plasma cells. Cytogenetics showed a 13 2 minus and trisomy 11 abnormality. He was started on Revlimid, Velcade. Was noted that since initiating treatment he became increasingly weak. He was then hospitalized from August 1 through August 3 with fecal impact paction predominantly. He went home and then returned to hospital with complaints of failure to thrive and generalized weakness. Is found to be dehydrated, hyponatremic at. He he received gentle rehydration. He was transfused 2 units of packed red cells. Last lab work done in the facility on August 15 showed a hemoglobin of 10.4 white count of 3.7 platelet count of 211,000 his sodium was 134 calcium 7.6 his BUN and creatinine were within the normal range.  He was transferred here for rehabilitation. I did not see him on admission as he was seen by another provider in our service. The history I am getting is that he initially did quite well in rehabilitation walking with a walker.  And sometime earlier this week thinks changed that he was no longer able to ambulate in fact he is barely able to stand up. As of this history was provided by his wife for. She tells me that as little as 3 weeks ago he was able to walk with a cane. She describes a somewhat shuffling type gait to but nothing that to a dramatic historically. In the hospital last time he started to develop a coarse tremor of his left greater than right leg it is not clear that this was ever evaluated. The patient tells me that he has chronic lumbar spine pain although this does not seem to have changed it. He does not complain of headache or visual disturbance. He does not have neck pain. Reviewing his records shows that he had a CT scan of the head and neck in June which showed degenerative disc disease and chronic microvascular ischemic disease. He had an MRI of his low back in 2010 which showed lumbar spinal stenosis and osteoporotic crush fracture at T12 and L1.   It was recommended that his chemotherapy stop while he was in the hospital. I am not completely certain how much she received although it probably wasn't that much. His Revlimid is now discontinued. He was on methotrexate for his rheumatoid arthritis and I believe this was also discontinue. He did receive some  Treatments of Velcade, Decadron and Zometa.   Past Medical History  Diagnosis Date  . Diverticulosis of colon (without mention of hemorrhage)   . Esophagitis, unspecified   . 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 Personal history of colonic polyps   . Reflux   . Hernia   . RA (rheumatoid arthritis)   . Myeloma   . Multiple myeloma, without mention of having achieved remission(203.00) 01/05/2013  . Subdural hematoma 6/14    secondary to fall    Past Surgical History  Procedure Laterality Date  . Inguinal hernia repair  03/28/1990    dr price  . Orif hip fracture       Procedures: Consultants:     There is no immunization  history on file for this patient.          Problem List as of 02/08/2013   Multiple myeloma, without mention of having achieved remission(203.00)   Personal history of colonic polyps   Esophageal reflux   RA (rheumatoid arthritis)   Hernia, hiatal   Candida infection, esophageal   Duodenal diverticulum   GERD (gastroesophageal reflux disease)   Diverticulosis of colon (without mention of hemorrhage)   Fecal impaction   Abdominal pain, unspecified site   Nausea with vomiting   Dehydration   Generalized weakness   Unspecified constipation   Protein-calorie malnutrition, severe   Anemia, unspecified       Genreral; increasing frailty, inability/difficulty with ambulating even with a walker which seems to have been acute to subacute over this week HEENT: No headache, no visual loss, no neck pain. Chronic significant lumbar sacral pain although this does not appear to be any different from more chronic complaint Respiratory; no cough, positive shortness of breath Cardiac: No exertional chest pain, no palpitations GI: No dysphagia, no abdominal pain, No change in bowel habits GU:No dysuria,no voiding difficulties Musculoskeletal;No active joint pain Neurologic; no diplopia, no generalized weakness, no sensory disturbances, lower extremity weakness and tremor. He has urinary frequency and some incontinence but he does not describe absent bowel or bladder sensation.  General; No acute  .fatigue  BP is 99/63 pulse 88 respirations 20 temperature 97.8 O2 sat 95% on room air HEENT; tongue is coated. Respiratory; clear air entry bilaterally, no wheezing, work of breathing is normal. Somewhat tachypneic with minor activity Cardiac; S1normal,S2normal,no murmers, no gallops,no bruits GI; no liver, no spleen,no masses,no tenderness GU; No bladder distention, no CVA tenderness MSK; no active synovitis. He has significant forefoot deformities probably from chronic rheumatoid arthritis Skin;  no lesions, no pressure areas Neurologic: CN's 2-12 normal, motor;  increased tone in his bilateral lower extremities vs. upper extremities. ? Question episodic fasciculations There is some cogwheeling. His sensory exam is light is normal to light touch and position. Reflexes normal in the upper extremities 3+ at both knee jerks absent at the ankle jerks even with his forefoot deformity I think both his Babinski responses are upgoing. His strength seems surprisingly quite good, certainly not lateralized. Gait; with considerable assistance I was able to sit this man up in bed although he is leaning to the left. He cannot stand he is leaning backwards/retropulsed . Mental Status; No overt Cognitive Difficulties, somewhat fatigued but no overt depression or delirium   Impression/plan this is a complex case and I am not exactly sure what his most active. It would appear that he is acutely declining however I am not completely able at the bedside to determine the exact cause #1 declining gait with components of parkinsonism, bilateral upper motor neuron findings in his  legs [extensor Babinski]. I think this is most likely to be a significant lower spinal cord issue although I don't think this explains all of this. His family is very concerned that he has had a stroke although I don't think this is the issue. When I first walked in the room I felt this man had unrecognized Parkinson's disease/drug-induced parkinsonism and yet I am unable to see any of the usual culprits and history seems much to acute. His drugs that he received for multiple myeloma I am aware that Velcade has a host of neurologic side effects although I don't believe this combination is in the usual culprits.   #2 multiple myeloma this is recently diagnosed and I believe he only had a short course/an adequate course of treatment. Other than putting him at some risk for opportunistic infections I don't know that this is contributing to his  constellation of symptoms which now includes gait decline, severe generalized weakness and fatigue. The gait issues seems to mostly started over the last week although he developed a tremor in the hospital per his wife's discussion. #3 anemia I will recheck this however at least as of August 15 his hemoglobin was stable #4 hyponatremia I will recheck this as well again as of August 15 his sodium was 134. #5 question orthostasis however the patient was barely able to sit up in bed with my assistance. I was not able to check this but I don't believe this is a primary issue 6 blood pressure. I stopped his Norvasc yesterday. I do not think he is not dehydrated. He does not appear to be acutely ill.  7 episodic dyspnea; respiratory exam is totally normal he is satting at 95%. On room air. Would wonder about risk for thromboembolic disease  I had some thoughts about ordering a urgent outpatient MRI of the thoracic and LS spine,, complete blood work including blood cultures, ASAP outpatient neurologic consultation, and curb siding his oncologist over the phone with regards to the myeloma, medication side effects etc. however the patient and his wife who was present are simply too anxious for this. They have requested returning him to [facility] for admission. We'll honor their request

## 2013-02-08 NOTE — Plan of Care (Deleted)
Problem: Consults Goal: Diagnosis - Craniotomy Outcome: Progressing Subdural hematoma

## 2013-02-09 ENCOUNTER — Encounter (HOSPITAL_COMMUNITY): Payer: Self-pay | Admitting: Neurosurgery

## 2013-02-09 ENCOUNTER — Inpatient Hospital Stay (HOSPITAL_COMMUNITY): Payer: Medicare Other

## 2013-02-09 ENCOUNTER — Ambulatory Visit: Payer: Medicare Other | Admitting: Hematology & Oncology

## 2013-02-09 ENCOUNTER — Other Ambulatory Visit: Payer: Medicare Other | Admitting: Lab

## 2013-02-09 ENCOUNTER — Other Ambulatory Visit: Payer: Self-pay | Admitting: *Deleted

## 2013-02-09 ENCOUNTER — Ambulatory Visit: Payer: Medicare Other

## 2013-02-09 DIAGNOSIS — W19XXXA Unspecified fall, initial encounter: Secondary | ICD-10-CM

## 2013-02-09 DIAGNOSIS — S065X9A Traumatic subdural hemorrhage with loss of consciousness of unspecified duration, initial encounter: Secondary | ICD-10-CM

## 2013-02-09 DIAGNOSIS — R627 Adult failure to thrive: Secondary | ICD-10-CM

## 2013-02-09 DIAGNOSIS — I6200 Nontraumatic subdural hemorrhage, unspecified: Secondary | ICD-10-CM

## 2013-02-09 DIAGNOSIS — C9000 Multiple myeloma not having achieved remission: Secondary | ICD-10-CM

## 2013-02-09 LAB — BASIC METABOLIC PANEL
CO2: 18 mEq/L — ABNORMAL LOW (ref 19–32)
Chloride: 101 mEq/L (ref 96–112)
Creatinine, Ser: 0.76 mg/dL (ref 0.50–1.35)

## 2013-02-09 MED ORDER — ALPRAZOLAM 0.25 MG PO TABS: ORAL_TABLET | ORAL | Status: AC

## 2013-02-09 MED ORDER — CEFAZOLIN SODIUM-DEXTROSE 2-3 GM-% IV SOLR
2.0000 g | Freq: Once | INTRAVENOUS | Status: AC
  Administered 2013-02-09: 2 g via INTRAVENOUS
  Filled 2013-02-09: qty 50

## 2013-02-09 NOTE — Consult Note (Signed)
#   [id number] is consult note.  Pete E.

## 2013-02-09 NOTE — Evaluation (Signed)
Physical Therapy Evaluation Patient Details Name: DUELL HOLDREN MRN: [id number] DOB: [birth year] Today's Date: 02/09/2013 Time: [id number] PT Time Calculation (min): 23 min  PT Assessment / Plan / Recommendation History of Present Illness  67 male whom I know well from last weekend when I admitted and Rxed him c Fecal Impaction/Ab Pain/N/V S/P Success c SMOG Enema.  I tried to keep him around and get therapy b/c i thought he was weak.  He saw PT and we set up HHPT and he wanted to go home.  He has known IgG MM and started treatments c Revlimid/Velcade/ Decadron/Zometa last week and Rxs this week on hold.   He was in ED this am and was admitted c generalized Weakness, Lightheaded, AFTT, No BM despite Laxatives, DeH, Anorexia, hypocalcemia, Orthostasis, and hypotension.  Denies N/V/Bloody stools Ab PAin, fevers, or CP/SOB or other issues. Wife states pt's weakness got worse last night. Had controlled slide to floor last night and has had polyuria since last night as well. Patient woke up this morning he was too weak to stand  Clinical Impression  Pt tolerating EOB/OOB mobility well however presents with decreased strength, impaired balance, and decreased endurance. Pt to benefit from CIR to achieve supervision level of function for safe d/c home with assist of spouse.    PT Assessment  Patient needs continued PT services    Follow Up Recommendations  CIR    Does the patient have the potential to tolerate intense rehabilitation      Barriers to Discharge        Equipment Recommendations       Recommendations for Other Services OT consult   Frequency Min 4X/week    Precautions / Restrictions Precautions Precautions: Fall Precaution Comments: pt with jp drain from craniotomy Restrictions Weight Bearing Restrictions: No   Pertinent Vitals/Pain Denies pain      Mobility  Bed Mobility Bed Mobility: Rolling Left;Left Sidelying to Sit;Sitting - Scoot to Edge of Bed Rolling Left: 1:  +2 Total assist Rolling Left: Patient Percentage: 40% Left Sidelying to Sit: 1: +2 Total assist;With rails;HOB flat Left Sidelying to Sit: Patient Percentage: 40% Sitting - Scoot to Edge of Bed: 2: Max assist Details for Bed Mobility Assistance: assist for trunk elevation, pt able to move LEs to EOB with max v/c's. Transfers Transfers: Sit to Stand;Stand to Sit;Stand Pivot Transfers Sit to Stand: 1: +2 Total assist;With upper extremity assist;From bed Sit to Stand: Patient Percentage: 60% Stand to Sit: 1: +2 Total assist;With upper extremity assist;With armrests;To chair/3-in-1 Stand to Sit: Patient Percentage: 60% Stand Pivot Transfers: 1: +2 Total assist Stand Pivot Transfers: Patient Percentage: 50% Details for Transfer Assistance: max v/c's for sequencing, short shuffled steps Ambulation/Gait Ambulation/Gait Assistance: Not tested (comment) (pt with dizziness and lethargy)    Exercises     PT Diagnosis: Difficulty walking;Generalized weakness;Acute pain  PT Problem List: Decreased strength;Decreased activity tolerance;Decreased mobility;Decreased knowledge of use of DME;Pain PT Treatment Interventions: DME instruction;Gait training;Functional mobility training;Therapeutic activities;Therapeutic exercise;Patient/family education     PT Goals(Current goals can be found in the care plan section) Acute Rehab PT Goals PT Goal Formulation: With patient Time For Goal Achievement: 02/16/13 Potential to Achieve Goals: Good  Visit Information  Last PT Received On: 02/09/13 Assistance Needed: +2 History of Present Illness: 43 male whom I know well from last weekend when I admitted and Rxed him c Fecal Impaction/Ab Pain/N/V S/P Success c SMOG Enema.  I tried to keep him around and get therapy b/c i  thought he was weak.  He saw PT and we set up HHPT and he wanted to go home.  He has known IgG MM and started treatments c Revlimid/Velcade/ Decadron/Zometa last week and Rxs this week on hold.    He was in ED this am and was admitted c generalized Weakness, Lightheaded, AFTT, No BM despite Laxatives, DeH, Anorexia, hypocalcemia, Orthostasis, and hypotension.  Denies N/V/Bloody stools Ab PAin, fevers, or CP/SOB or other issues. Wife states pt's weakness got worse last night. Had controlled slide to floor last night and has had polyuria since last night as well. Patient woke up this morning he was too weak to stand       Prior Functioning  Home Living Family/patient expects to be discharged to:: Inpatient rehab Prior Function Level of Independence: Needs assistance Gait / Transfers Assistance Needed: used RW ADL's / Homemaking Assistance Needed: wife assisted with LB dressing/bathing since last hospital admission Communication Communication: No difficulties Dominant Hand: Right    Cognition  Cognition Arousal/Alertness: Lethargic Behavior During Therapy: WFL for tasks assessed/performed Overall Cognitive Status: Impaired/Different from baseline Area of Impairment: Problem solving Problem Solving: Slow processing;Requires verbal cues;Requires tactile cues General Comments: pt lethargic from pain medication    Extremity/Trunk Assessment Upper Extremity Assessment Upper Extremity Assessment: Generalized weakness Lower Extremity Assessment Lower Extremity Assessment: Generalized weakness Cervical / Trunk Assessment Cervical / Trunk Assessment: Normal   Balance Balance Balance Assessed: Yes Static Sitting Balance Static Sitting - Balance Support: Bilateral upper extremity supported Static Sitting - Level of Assistance: 4: Min assist Static Sitting - Comment/# of Minutes: v/c's to maintain eye opening Static Standing Balance Static Standing - Balance Support: Bilateral upper extremity supported Static Standing - Level of Assistance: 1: +2 Total assist Static Standing - Comment/# of Minutes: pt with ant/post sway, pt able to complete 80%  End of Session PT - End of  Session Equipment Utilized During Treatment: Gait belt Activity Tolerance: Patient limited by lethargy Patient left: in chair;with call bell/phone within reach Nurse Communication: Mobility status  GP     Marcene Brawn 02/09/2013, 4:34 PM  Lewis Shock, PT, DPT Pager #: [phone] Office #: [phone]

## 2013-02-09 NOTE — Plan of Care (Signed)
Problem: Consults Goal: Diagnosis - Craniotomy Outcome: Completed/Met Date Met:  02/09/13 Subdural hematoma

## 2013-02-09 NOTE — ED Provider Notes (Signed)
Medical screening examination/treatment/procedure(s) were conducted as a shared visit with non-physician practitioner(s) and myself.  I personally evaluated the patient during the encounter Pt with inability to ambulate since tues, falling to L and subjective L-sided weakness. No focal deficits on exam. R SDH on CT with shift. Discussed with Dr Lovell Sheehan who will see.   Loren Racer, MD 02/09/13 [phone]

## 2013-02-09 NOTE — Progress Notes (Signed)
UR completed 

## 2013-02-09 NOTE — Consult Note (Signed)
Physical Medicine and Rehabilitation Consult Reason for Consult:SDH Referring Physician: DR Tressie Stalker   HPI: Shawn Chen is a 79 y.o. right-handed male history of rheumatoid arthritis was myeloma. Admitted 02/08/2013 history of right small subdural hematoma identified June 2014 and monitored conservatively. Presented with progressive left-sided weakness and altered mental status. Followup imaging shows large right fluid collection consistent with subdural hematoma. Underwent right craniotomy evacuation subdural hematoma per Dr. Lovell Sheehan 02/08/2013. Placed on Keppra for seizure prophylaxis. Physical and occupational therapy evaluations pending. M.D. has requested physical medicine rehabilitation consult   Review of Systems  Constitutional: Positive for malaise/fatigue.  Gastrointestinal:       GRED  Musculoskeletal: Positive for myalgias, back pain and joint pain.  Neurological: Positive for weakness.  Psychiatric/Behavioral: Positive for depression.       Anxiety   Past Medical History  Diagnosis Date  . Diverticulosis of colon (without mention of hemorrhage)   . Esophagitis, unspecified   . 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 Personal history of colonic polyps   . Reflux   . Hernia   . RA (rheumatoid arthritis)   . Myeloma   . Multiple myeloma, without mention of having achieved remission(203.00) 01/05/2013  . Subdural hematoma 6/14    secondary to fall   Past Surgical History  Procedure Laterality Date  . Inguinal hernia repair  03/28/1990    dr price  . Orif hip fracture     Family History  Problem Relation Age of Onset  . Colon cancer Neg Hx    Social History:  reports that he quit smoking about 22 years ago. He has never used smokeless tobacco. He reports that he does not drink alcohol or use illicit drugs. Allergies: No Known Allergies Medications Prior to Admission  Medication Sig Dispense Refill  . alendronate (FOSAMAX) 70 MG tablet Take  70 mg by mouth every 7 (seven) days. Take with a full glass of water on an empty stomach.      . ALPRAZolam (XANAX) 0.25 MG tablet Take 0.25 mg by mouth 3 (three) times daily as needed for anxiety.      [name] amLODipine (NORVASC) 5 MG tablet Take 1 tablet (5 mg total) by mouth daily.  30 tablet  6  . aspirin EC 81 MG tablet Take 162 mg by mouth daily.      . bisacodyl (DULCOLAX) 10 MG suppository Place 10 mg rectally daily as needed for constipation.      . citalopram (CELEXA) 10 MG tablet Take 10 mg by mouth daily.       . famciclovir (FAMVIR) 500 MG tablet Take 250 mg by mouth at bedtime.      . feeding supplement (ENSURE COMPLETE) LIQD Take 237 mLs by mouth 2 (two) times daily between meals.  60 Bottle  3  . folic acid (FOLVITE) 1 MG tablet Take 2 tablets (2 mg total) by mouth daily.  30 tablet  6  . loperamide (IMODIUM) 2 MG capsule Take 1 capsule (2 mg total) by mouth as needed for diarrhea or loose stools.  30 capsule  0  . magnesium hydroxide (MILK OF MAGNESIA) 400 MG/5ML suspension Take 30 mLs by mouth daily as needed for constipation.      . megestrol (MEGACE) 400 MG/10ML suspension Take 10 mLs (400 mg total) by mouth daily.  240 mL  0  . metoprolol succinate (TOPROL-XL) 50 MG 24 hr tablet Take 25 mg by mouth every morning. Take with or immediately following a  meal.      . Multiple Vitamin (MULTIVITAMIN WITH MINERALS) TABS Take 1 tablet by mouth daily.      . ondansetron (ZOFRAN-ODT) 8 MG disintegrating tablet Take 1 tablet (8 mg total) by mouth every 12 (twelve) hours as needed for nausea.  20 tablet  0  . pantoprazole (PROTONIX) 20 MG tablet Take 20 mg by mouth daily.      . polyethylene glycol (MIRALAX / GLYCOLAX) packet Take 17 g by mouth daily.  5 each  0  . protein supplement (RESOURCE BENEPROTEIN) POWD Take 1 scoop by mouth 2 (two) times daily.      . simvastatin (ZOCOR) 40 MG tablet Take 40 mg by mouth daily.        . Sodium Phosphates (RA SALINE ENEMA) 19-7 GM/118ML ENEM Place 1  Bottle rectally daily as needed (for constipation).      . tamsulosin (FLOMAX) 0.4 MG CAPS Take 0.4 mg by mouth daily.        Home: Home Living Family/patient expects to be discharged to:: Private residence Living Arrangements: Spouse/significant other  Functional History:   Functional Status:  Mobility:          ADL:    Cognition: Cognition Orientation Level: Oriented to person;Oriented to situation;Oriented to time    Blood pressure 125/75, pulse 78, temperature 98.4 F (36.9 C), temperature source Oral, resp. rate 17, height 5\' 10"  (1.778 m), weight 65.5 kg (144 lb 6.4 oz), SpO2 99.00%. Physical Exam  Constitutional: He is oriented to person, place, and time.  Eyes: EOM are normal.  Neck: Normal range of motion. Neck supple. No thyromegaly present.  Cardiovascular: Normal rate and regular rhythm.   Pulmonary/Chest: Effort normal and breath sounds normal. No respiratory distress.  Abdominal: Soft. Bowel sounds are normal. He exhibits no distension.  Neurological: He is alert and oriented to person, place, and time.  Patient is somewhat lethargic but can answer simple questions and answers biographical questions. New he was in the hospital but said it was for "his cancer." no focal CN signs. He does have significant intentional tremors in both UE's. He does move all 4's with grossly 2+ to 3+ strength, perhaps slightly weaker on left (inconsistent). He senses pain in all 4's   Skin:  Cranio site clean and dry  Psychiatric:  Flat, falls asleep quickly.    Results for orders placed during the hospital encounter of 02/08/13 (from the past 24 hour(s))  CBC WITH DIFFERENTIAL     Status: Abnormal   Collection Time    02/08/13  1:50 PM      Result Value Range   WBC 7.9  4.0 - 10.5 K/uL   RBC 3.86 (*) 4.22 - 5.81 MIL/uL   Hemoglobin 12.2 (*) 13.0 - 17.0 g/dL   HCT 16.1 (*) 09.6 - 04.5 %   MCV 93.3  78.0 - 100.0 fL   MCH 31.6  26.0 - 34.0 pg   MCHC 33.9  30.0 - 36.0 g/dL    RDW 40.9  81.1 - 91.4 %   Platelets 357  150 - 400 K/uL   Neutrophils Relative % 69  43 - 77 %   Neutro Abs 5.4  1.7 - 7.7 K/uL   Lymphocytes Relative 22  12 - 46 %   Lymphs Abs 1.7  0.7 - 4.0 K/uL   Monocytes Relative 8  3 - 12 %   Monocytes Absolute 0.6  0.1 - 1.0 K/uL   Eosinophils Relative 1  0 - 5 %  Eosinophils Absolute 0.1  0.0 - 0.7 K/uL   Basophils Relative 0  0 - 1 %   Basophils Absolute 0.0  0.0 - 0.1 K/uL  COMPREHENSIVE METABOLIC PANEL     Status: Abnormal   Collection Time    02/08/13  1:50 PM      Result Value Range   Sodium 131 (*) 135 - 145 mEq/L   Potassium 4.2  3.5 - 5.1 mEq/L   Chloride 100  96 - 112 mEq/L   CO2 21  19 - 32 mEq/L   Glucose, Bld 87  70 - 99 mg/dL   BUN 22  6 - 23 mg/dL   Creatinine, Ser 4.54  0.50 - 1.35 mg/dL   Calcium 8.6  8.4 - 09.8 mg/dL   Total Protein 7.5  6.0 - 8.3 g/dL   Albumin 2.6 (*) 3.5 - 5.2 g/dL   AST 22  0 - 37 U/L   ALT 29  0 - 53 U/L   Alkaline Phosphatase 84  39 - 117 U/L   Total Bilirubin 0.4  0.3 - 1.2 mg/dL   GFR calc non Af Amer 76 (*) >90 mL/min   GFR calc Af Amer 88 (*) >90 mL/min  PROTIME-INR     Status: None   Collection Time    02/08/13  1:50 PM      Result Value Range   Prothrombin Time 14.5  11.6 - 15.2 seconds   INR 1.15  0.00 - 1.49  APTT     Status: None   Collection Time    02/08/13  1:50 PM      Result Value Range   aPTT 26  24 - 37 seconds  MRSA PCR SCREENING     Status: None   Collection Time    02/08/13  7:17 PM      Result Value Range   MRSA by PCR NEGATIVE  NEGATIVE  BASIC METABOLIC PANEL     Status: Abnormal   Collection Time    02/09/13  2:00 AM      Result Value Range   Sodium 129 (*) 135 - 145 mEq/L   Potassium 4.1  3.5 - 5.1 mEq/L   Chloride 101  96 - 112 mEq/L   CO2 18 (*) 19 - 32 mEq/L   Glucose, Bld 106 (*) 70 - 99 mg/dL   BUN 18  6 - 23 mg/dL   Creatinine, Ser 1.19  0.50 - 1.35 mg/dL   Calcium 7.8 (*) 8.4 - 10.5 mg/dL   GFR calc non Af Amer 81 (*) >90 mL/min   GFR calc  Af Amer >90  >90 mL/min   Dg Chest 2 View  02/08/2013   *RADIOLOGY REPORT*  Clinical Data: Generalized weakness.  Admission.  Current history multiple myeloma.  CHEST - 2 VIEW  Comparison: One-view chest x-ray 01/22/2013, 01/16/2013, 11/24/2012, 01/17/2010.  Findings: Cardiac silhouette moderately enlarged but stable.  Small to moderate sized hiatal hernia, unchanged.  Hilar and mediastinal contours otherwise unremarkable.  Pulmonary vascularity normal. Emphysematous changes in the upper lobes.  Probable calcified granuloma or hamartoma in the right mid lung on the PA image, likely the right middle lobe, unchanged since the June, 2014 examination.  No new pulmonary parenchymal abnormalities.  No pleural effusions.  Degenerative changes involving the thoracic spine and generalized osseous demineralization.  Old compression fracture of T12.  IMPRESSION: COPD/emphysema.  No acute cardiopulmonary disease.  Stable cardiomegaly.  Stable hiatal hernia.  Old compression fracture of T12.   Original  Report Authenticated By: Hulan Saas, M.D.   Ct Head Wo Contrast  02/09/2013   CLINICAL DATA:  79 year old male status post craniotomy. Right subdural hematoma with recent increased weakness.  EXAM: CT HEAD WITHOUT CONTRAST  TECHNIQUE: Contiguous axial images were obtained from the base of the skull through the vertex without intravenous contrast.  COMPARISON:  02/08/2013 and earlier.  FINDINGS: Small right convexity craniotomy has been performed with right subdural drain placed. Postoperative changes to the skull and overlying soft tissues. Small volume pneumocephalus.  Elsewhere osseous structures are stable. Stable paranasal sinuses and mastoids. Negative orbits soft tissues.  Cavum septum pellucidum again incidentally noted. Extra-axial collection on the right has decreased in thickness, now up to 13 -17 mm, but appears somewhat more generalized. Density within the collection now is more heterogeneous.  Mass effect  on the right hemisphere has decreased. Leftward midline shift is decreased to 5 mm (previously 7 mm). No intra-axial or intraventricular hemorrhage.  Stable gray-white matter differentiation throughout the brain. No ventriculomegaly. No evidence of cortically based acute infarction identified. Calcified atherosclerosis at the skull base. No suspicious intracranial vascular hyperdensity.  IMPRESSION: 1. Mixed density right subdural hematoma with decreased mass effect on the right hemisphere and decreased midline shift (now 5 mm) following right subdural drain placement. Residual collection is up to 17 mm. Small volume pneumocephalus.  2. No new intracranial abnormality.   Electronically Signed   By: Augusto Gamble   On: 02/09/2013 08:07   Ct Head Wo Contrast  02/08/2013   *RADIOLOGY REPORT*  Clinical Data:  Increased weakness, tremors, history multiple myeloma, melanoma, left side weakness since Tuesday  CT HEAD WITHOUT CONTRAST  Technique:  Contiguous axial images were obtained from the base of the skull through the vertex without contrast.  Comparison: 11/22/2012  Findings: Generalized atrophy. Large low attenuation right frontoparietal subdural collection measuring up to 2.9 cm thick consistent with a chronic subdural hematoma. Significant mass effect upon right hemisphere with sulcal effacement, hemisphere compression, and 7 mm of right-to-left midline shift. Small vessel chronic ischemic changes of deep cerebral white matter. No definite intracranial hemorrhage, mass, or evidence of acute infarction. No high attenuation component to the above the subdural collection is identified. Atherosclerotic calcifications at skull base. Bones and sinuses unremarkable. Small bilateral mastoid effusions.  IMPRESSION: Large right frontoparietal chronic subdural hematoma 2.9 cm in thickness causing significant mass effect upon the right hemisphere and 7 mm of right-to-left midline shift.  Critical Value/emergent results were  called by telephone at the time of interpretation on 02/08/2013 at 1400 hours to V. Pickering, who verbally acknowledged these results.   Original Report Authenticated By: Ulyses Southward, M.D.    Assessment/Plan: Diagnosis: Acute on Chronic SDH s/p craniotomy 1. Does the need for close, 24 hr/day medical supervision in concert with the patient's rehab needs make it unreasonable for this patient to be served in a less intensive setting? Potentially 2. Co-Morbidities requiring supervision/potential complications: MM, RA, GERD,  3. Due to bladder management, bowel management, safety, skin/wound care, disease management, medication administration, pain management and patient education, does the patient require 24 hr/day rehab nursing? Yes 4. Does the patient require coordinated care of a physician, rehab nurse, PT (1-2 hrs/day, 5 days/week), OT (1-2 hrs/day, 5 days/week) and SLP (1-2 hrs/day, 5 days/week) to address physical and functional deficits in the context of the above medical diagnosis(es)? Yes Addressing deficits in the following areas: balance, endurance, locomotion, strength, transferring, bowel/bladder control, bathing, dressing, feeding, grooming, toileting, cognition, speech and  psychosocial support 5. Can the patient actively participate in an intensive therapy program of at least 3 hrs of therapy per day at least 5 days per week? Yes and Potentially 6. The potential for patient to make measurable gains while on inpatient rehab is excellent 7. Anticipated functional outcomes upon discharge from inpatient rehab are supervision to min assist with PT, supervision to min assist with OT, supervision with SLP. 8. Estimated rehab length of stay to reach the above functional goals is: ?2 weeks 9. Does the patient have adequate social supports to accommodate these discharge functional goals? Potentially 10. Anticipated D/C setting: Home 11. Anticipated post D/C treatments: HH therapy 12. Overall  Rehab/Functional Prognosis: good  RECOMMENDATIONS: This patient's condition is appropriate for continued rehabilitative care in the following setting: CIR Patient has agreed to participate in recommended program. Yes and Potentially Note that insurance prior authorization may be required for reimbursement for recommended care.  Comment: Await therapy evals, although based on history and my exam, he's likely to benefit from our inpatient program. Rehab RN to follow up.   Ranelle Oyster, MD, [place] [facility]     02/09/2013

## 2013-02-09 NOTE — Consult Note (Signed)
NAMEMarland Kitchen  Shawn Chen, Shawn Chen NO.:  [id number]  MEDICAL RECORD NO.:  [id number]  LOCATION:  3M05C                        FACILITY:  [facility]  PHYSICIAN:  Josph Macho, M.D.  DATE OF BIRTH:  1926/08/01  DATE OF CONSULTATION: DATE OF DISCHARGE:                                CONSULTATION   REFERRING PHYSICIAN:  Cristi Loron, M.D.  REASON FOR CONSULTATION: 1. Subdural hematoma. 2. Myelodysplasia. 3. Multiple myeloma.  HISTORY OF PRESENT ILLNESS:  Shawn Chen is a very nice 79 year old white gentleman.  He is well known to me.  He has history of myeloma. He has IgG lambda myeloma.  He did poorly with systemic chemotherapy.  We had him on Velcade and Revlimid.  He was admitted to [facility] about 3 weeks ago.  He had failure to thrive.  He was anemic and leukopenic.  He also has a history of myelodysplasia.  The bone by bone marrow, looks like he may have sideroblastic anemia.  We did give him some testosterone while he was in the hospital.  He also got blood transfusion.  We stopped his chemotherapy.  He has not been on chemotherapy prior for about a month.  He has rheumatoid arthritis.  He is on methotrexate.  He is discharged to a rehab center.  He was admitted on the 24th.  He was found to have a subdural hematoma. It was on the right.  Right craniotomy with evacuation by Dr. Lovell Sheehan.  Thankfully, when he was admitted, he had relatively normal blood counts. White cell count 7.9, hemoglobin 12.2, hematocrit 36, and platelet count 357.  I think the testosterone that we gave him along the blood really got his blood count up better.  His electrolytes all looked good.  He had no renal insufficiency.  LFTs looked okay.  Coagulation parameters also were fine with INR of 1.15 and PTT of 26.  He has limited movement of his left side.  Right side seems to be doing okay.  He did not have any obvious fall or trauma at the rehab center from  what I can tell.  I think we were asked to see him because of his hematologic issues.  PAST MEDICAL HISTORY:  Remarkable for: 1. Rheumatoid arthritis. 2. IgG lambda myeloma. 3. Refractory anemia with ringed sideroblasts. 4. GERD.  ALLERGIES:  None.  ADMISSION MEDICATIONS: 1. Fosamax 70 mg p.o. q. week. 2. Xanax 0.25 mg t.i.d. p.r.n. 3. Norvasc 5 mg p.o. daily. 4. Aspirin 162 mg p.o. daily. 5. Celexa 10 mg p.o. daily. 6. Famvir 250 mg p.o. at bedtime. 7. Folic acid 2 mg p.o. daily. 8. Metoprolol 25 mg p.o. daily. 9. Protonix 20 mg p.o. daily. 10.MiraLAX 17 g p.o. daily. 11.Flomax 0.4 mg p.o. daily. 12.Zocor 40 mg p.o. daily.  SOCIAL HISTORY:  Remarkable for past tobacco use.  Stopped 20 years ago. No alcohol use.  FAMILY HISTORY:  Noncontributory.  PHYSICAL EXAMINATION:  GENERAL:  This is an elderly, frail white gentleman in no obvious distress. VITAL SIGNS:  Temperature 98.4, pulse 78, respiratory rate 17, blood pressure 125/75. HEAD AND NECK:  Normocephalic, atraumatic skull.  He does have a dressing from his craniotomy site.  Pupils react appropriately.  He has some temporal muscle wasting. NECK:  There is no adenopathy in neck. LUNGS:  Decreased at the bases. CARDIAC:  Regular rate and rhythm with normal S1, S2.  There are no murmurs, rubs, or bruits. ABDOMEN:  Soft.  He has decent bowel sounds.  There is no fluid wave. There is no palpable hepatosplenomegaly. EXTREMITIES:  Shows very limited movement of the left side.  Right side seems to be doing pretty well.  LABORATORY STUDIES:  White cell count 7.9, hemoglobin 12.2, hematocrit 32, platelet count 357.  IMPRESSION:  Shawn Chen is an 79 year old gentleman with IgG lambda myeloma and refractory anemia with ringed sideroblasts.  I do not see how these are related to him having the subdural hematoma. He has also been off chemotherapy now.  He is not thrombocytopenic.  He has no underlying coagulopathy.  His  platelet count looks fine and today, he has had no qualitative platelet disorder.  From my point of view, his mortality is not based on his hematologic issues by any means.  His myeloma is well controlled in my mind.  We can manage his anemia relatively simply.  He has not yet received any ESA agents.  We will certainly follow along.     Josph Macho, M.D.     PRE/MEDQ  D:  02/09/2013  T:  02/09/2013  Job:  [id number]

## 2013-02-09 NOTE — Progress Notes (Signed)
Patient ID: Shawn Chen, male   DOB: [birth year], 79 y.o.   MRN: [id number] Subjective:  The patient is alert and pleasant. He is mildly confused. He is in no apparent distress.  Objective: Vital signs in last 24 hours: Temp:  [97.6 F (36.4 C)-98.8 F (37.1 C)] 98.4 F (36.9 C) (08/25 0400) Pulse Rate:  [72-91] 78 (08/25 0700) Resp:  [11-33] 17 (08/25 0700) BP: (105-132)/(55-75) 125/75 mmHg (08/25 0700) SpO2:  [96 %-100 %] 99 % (08/25 0700) Arterial Line BP: (110-164)/(52-71) 159/59 mmHg (08/25 0700) Weight:  [65.5 kg (144 lb 6.4 oz)] 65.5 kg (144 lb 6.4 oz) (08/24 2000)  Intake/Output from previous day: 08/24 0701 - 08/25 0700 In: 1885 [P.O.:60; I.V.:1825] Out: 1215 [Urine:1070; Drains:145] Intake/Output this shift:    Physical exam the patient is alert and oriented x2, person in a hospital, Glasgow Coma Scale 14. His speech is normal, he is mildly left hemiparetic. His pupils are equal. His dressing is clean and dry.  I reviewed the patient's head CT performed at [facility] without contrast today. It demonstrates that the right subdural hematoma has largely resolved. I.e. looks good.  Lab Results:  Recent Labs  02/08/13 1350  WBC 7.9  HGB 12.2*  HCT 36.0*  PLT 357   BMET  Recent Labs  02/08/13 1350 02/09/13 0200  NA 131* 129*  K 4.2 4.1  CL 100 101  CO2 21 18*  GLUCOSE 87 106*  BUN 22 18  CREATININE 0.91 0.76  CALCIUM 8.6 7.8*    Studies/Results: Dg Chest 2 View  02/08/2013   *RADIOLOGY REPORT*  Clinical Data: Generalized weakness.  Admission.  Current history multiple myeloma.  CHEST - 2 VIEW  Comparison: One-view chest x-ray 01/22/2013, 01/16/2013, 11/24/2012, 01/17/2010.  Findings: Cardiac silhouette moderately enlarged but stable.  Small to moderate sized hiatal hernia, unchanged.  Hilar and mediastinal contours otherwise unremarkable.  Pulmonary vascularity normal. Emphysematous changes in the upper lobes.  Probable calcified granuloma or  hamartoma in the right mid lung on the PA image, likely the right middle lobe, unchanged since the June, 2014 examination.  No new pulmonary parenchymal abnormalities.  No pleural effusions.  Degenerative changes involving the thoracic spine and generalized osseous demineralization.  Old compression fracture of T12.  IMPRESSION: COPD/emphysema.  No acute cardiopulmonary disease.  Stable cardiomegaly.  Stable hiatal hernia.  Old compression fracture of T12.   Original Report Authenticated By: Hulan Saas, M.D.   Ct Head Wo Contrast  02/08/2013   *RADIOLOGY REPORT*  Clinical Data:  Increased weakness, tremors, history multiple myeloma, melanoma, left side weakness since Tuesday  CT HEAD WITHOUT CONTRAST  Technique:  Contiguous axial images were obtained from the base of the skull through the vertex without contrast.  Comparison: 11/22/2012  Findings: Generalized atrophy. Large low attenuation right frontoparietal subdural collection measuring up to 2.9 cm thick consistent with a chronic subdural hematoma. Significant mass effect upon right hemisphere with sulcal effacement, hemisphere compression, and 7 mm of right-to-left midline shift. Small vessel chronic ischemic changes of deep cerebral white matter. No definite intracranial hemorrhage, mass, or evidence of acute infarction. No high attenuation component to the above the subdural collection is identified. Atherosclerotic calcifications at skull base. Bones and sinuses unremarkable. Small bilateral mastoid effusions.  IMPRESSION: Large right frontoparietal chronic subdural hematoma 2.9 cm in thickness causing significant mass effect upon the right hemisphere and 7 mm of right-to-left midline shift.  Critical Value/emergent results were called by telephone at the time of interpretation on  02/08/2013 at 1400 hours to V. Pickering, who verbally acknowledged these results.   Original Report Authenticated By: Ulyses Southward, M.D.    Assessment/Plan: Postop day  1: We will mobilize the patient with PT and OT, start regular diet, remove his A-line and Foley, et Karie Soda.  LOS: 1 day     Zaleah Ternes D 02/09/2013, 7:40 AM

## 2013-02-10 MED ORDER — ALPRAZOLAM 0.25 MG PO TABS: 0.2500 mg | ORAL_TABLET | Freq: Three times a day (TID) | ORAL | Status: DC | PRN

## 2013-02-10 NOTE — Evaluation (Signed)
Occupational Therapy Evaluation Patient Details Name: Shawn Chen MRN: [id number] DOB: Nov 28, 1926 Today's Date: 02/10/2013 Time: 4098-1191 OT Time Calculation (min): 33 min  OT Assessment / Plan / Recommendation History of present illness 70 male whom I know well from last weekend when I admitted and Rxed him c Fecal Impaction/Ab Pain/N/V S/P Success c SMOG Enema.  I tried to keep him around and get therapy b/c i thought he was weak.  He saw PT and we set up HHPT and he wanted to go home.  He has known IgG MM and started treatments c Revlimid/Velcade/ Decadron/Zometa last week and Rxs this week on hold.   He was in ED this am and was admitted c generalized Weakness, Lightheaded, AFTT, No BM despite Laxatives, DeH, Anorexia, hypocalcemia, Orthostasis, and hypotension.  Denies N/V/Bloody stools Ab PAin, fevers, or CP/SOB or other issues. Wife states pt's weakness got worse last night. Had controlled slide to floor last night and has had polyuria since last night as well. Patient woke up this morning he was too weak to stand   Clinical Impression   Pt presents with deficits listed below and would benefit from cont OT to increase I and safety with adls so he can eventually return home.  Wife mentioned [employer] as well as CIR.    OT Assessment  Patient needs continued OT Services    Follow Up Recommendations  CIR;Supervision/Assistance - 24 hour    Barriers to Discharge   wife and children can assist post d/c  Equipment Recommendations  None recommended by OT    Recommendations for Other Services Rehab consult  Frequency  Min 2X/week    Precautions / Restrictions Precautions Precautions: Fall Restrictions Weight Bearing Restrictions: No   Pertinent Vitals/Pain Pt initially c/o no pain.  At end of session, pt said he had mild pain in his head    ADL  Eating/Feeding: Performed;Set up Where Assessed - Eating/Feeding: Chair Grooming: Simulated;Wash/dry face;Set up Where  Assessed - Grooming: Supported sitting Upper Body Bathing: Simulated;Minimal assistance Where Assessed - Upper Body Bathing: Supported sitting Lower Body Bathing: Simulated;Maximal assistance Where Assessed - Lower Body Bathing: Supported sit to stand Upper Body Dressing: Simulated;Moderate assistance Where Assessed - Upper Body Dressing: Supported sitting Lower Body Dressing: Maximal assistance Where Assessed - Lower Body Dressing: Supported sit to Pharmacist, hospital: +2 Total assistance Toilet Transfer: Patient Percentage: 20% Statistician Method: Surveyor, minerals: Materials engineer and Hygiene: Simulated;+2 Total assistance Toileting - Architect and Hygiene: Patient Percentage: 20% Where Assessed - Glass blower/designer Manipulation and Hygiene: Standing Equipment Used: Gait belt Transfers/Ambulation Related to ADLs: pivot to chair with +2 assist.  Pt stood x2 with total a x2 as well. ADL Comments: pt very lethargic this session.  Pt attempted to donn socks but was unable.  Pt able to cross legs but just go too tired.      OT Diagnosis: Generalized weakness  OT Problem List: Decreased strength;Decreased activity tolerance;Impaired balance (sitting and/or standing);Decreased cognition;Decreased safety awareness;Decreased knowledge of use of DME or AE OT Treatment Interventions: Self-care/ADL training;DME and/or AE instruction;Patient/family education;Therapeutic exercise;Therapeutic activities   OT Goals(Current goals can be found in the care plan section) Acute Rehab OT Goals Patient Stated Goal: wife wants pt to be more I so he can go home. OT Goal Formulation: With patient Time For Goal Achievement: 02/24/13 Potential to Achieve Goals: Good ADL Goals Pt Will Perform Grooming: with min assist;standing Pt Will Perform Lower  Body Bathing: with mod assist;sit to/from stand Pt Will Perform Lower Body Dressing: with  min assist;sit to/from stand Pt Will Transfer to Toilet: with mod assist;bedside commode Additional ADL Goal #1: Pt will sit EOB for 6 minutes with stand by assist in prep for adls on EOB  Visit Information  Last OT Received On: 02/10/13 Assistance Needed: +2 PT/OT Co-Evaluation/Treatment: Yes History of Present Illness: 88 male whom I know well from last weekend when I admitted and Rxed him c Fecal Impaction/Ab Pain/N/V S/P Success c SMOG Enema.  I tried to keep him around and get therapy b/c i thought he was weak.  He saw PT and we set up HHPT and he wanted to go home.  He has known IgG MM and started treatments c Revlimid/Velcade/ Decadron/Zometa last week and Rxs this week on hold.   He was in ED this am and was admitted c generalized Weakness, Lightheaded, AFTT, No BM despite Laxatives, DeH, Anorexia, hypocalcemia, Orthostasis, and hypotension.  Denies N/V/Bloody stools Ab PAin, fevers, or CP/SOB or other issues. Wife states pt's weakness got worse last night. Had controlled slide to floor last night and has had polyuria since last night as well. Patient woke up this morning he was too weak to stand       Prior Functioning     Home Living Family/patient expects to be discharged to:: Inpatient rehab Living Arrangements: Spouse/significant other Available Help at Discharge: Family Type of Home: House Home Access: Stairs to enter [employer] of Steps: 4-5 Entrance Stairs-Rails: None Home Layout: Two level;Able to live on main level with bedroom/bathroom Home Equipment: Dan Humphreys - 2 wheels;Cane - single point;Bedside commode Prior Function Level of Independence: Needs assistance Gait / Transfers Assistance Needed: used RW ADL's / Homemaking Assistance Needed: wife assisted with LB dressing/bathing since last hospital admission Communication Communication: No difficulties Dominant Hand: Right         Vision/Perception Vision - History Baseline Vision: No visual  deficits Patient Visual Report: No change from baseline Vision - Assessment Vision Assessment: Vision not tested   Cognition  Cognition Arousal/Alertness: Lethargic Behavior During Therapy: WFL for tasks assessed/performed Overall Cognitive Status: Impaired/Different from baseline Area of Impairment: Problem solving;Attention Current Attention Level: Sustained Problem Solving: Slow processing;Requires verbal cues;Requires tactile cues General Comments: pt lethargic from pain medication    Extremity/Trunk Assessment Upper Extremity Assessment Upper Extremity Assessment: Generalized weakness Lower Extremity Assessment Lower Extremity Assessment: Defer to PT evaluation Cervical / Trunk Assessment Cervical / Trunk Assessment: Normal     Mobility Bed Mobility Bed Mobility: Sit to Supine Sit to Supine: 1: +2 Total assist Sit to Supine: Patient Percentage: 30% Details for Bed Mobility Assistance: assist to get legs onto bed and move pt up in bed. Transfers Transfers: Stand to Sit;Sit to Stand Sit to Stand: 1: +2 Total assist;With upper extremity assist;From bed Sit to Stand: Patient Percentage: 40% Stand to Sit: 1: +2 Total assist;With upper extremity assist;With armrests;To chair/3-in-1 Stand to Sit: Patient Percentage: 40% Details for Transfer Assistance: max v/c's for sequencing, short shuffled steps     Exercise     Balance Balance Balance Assessed: Yes Static Sitting Balance Static Sitting - Balance Support: Bilateral upper extremity supported Static Sitting - Level of Assistance: 3: Mod assist Static Sitting - Comment/# of Minutes: vc to keep feet on floor. Pt with posterior lean Static Standing Balance Static Standing - Balance Support: Bilateral upper extremity supported Static Standing - Level of Assistance: 1: +2 Total assist Static Standing -  Comment/# of Minutes: less than a minute.  Pt with posterior lean.  IV started leaking.  Pt returned to sitting.  Nursing  notified.   End of Session OT - End of Session Equipment Utilized During Treatment: Gait belt Activity Tolerance: Patient limited by lethargy Patient left: in bed;with call bell/phone within reach;with family/visitor present Nurse Communication: Mobility status  GO     Hope Budds 02/10/2013, 12:10 PM [phone]

## 2013-02-10 NOTE — Progress Notes (Signed)
Physical Therapy Treatment Patient Details Name: Shawn Chen MRN: [id number] DOB: 01-Jun-1927 Today's Date: 02/10/2013 Time: 8295-6213 PT Time Calculation (min): 32 min  PT Assessment / Plan / Recommendation  History of Present Illness 84 male recently admitted and d/c for Fecal Impaction/Ab Pain/N/V S/P Success c SMOG Enema. Per MD he tried to keep him around and get therapy b/c he thought the pt was weak.  The pt worked with PT and was set up HHPT because he wanted to go home.  Pt has known IgG MM and started treatments c Revlimid/Velcade/ Decadron/Zometa last week and Rxs this week on hold.   pt was in ED this am and was admitted c generalized Weakness, Lightheaded, AFTT, No BM despite Laxatives, DeH, Anorexia, hypocalcemia, Orthostasis, and hypotension.  Denies N/V/Bloody stools Ab PAin, fevers, or CP/SOB or other issues. Wife states pt's weakness got worse last night. Had controlled slide to floor last night and has had polyuria since last night as well. Patient woke up this morning he was too weak to stand   PT Comments   Pt continues to have impaired balance and overall very fatigue.  Pt having difficulty keeping eyes open and needs cues to stay awake.  Follow Up Recommendations  CIR     Equipment Recommendations  None recommended by PT    Frequency Min 4X/week   Progress towards PT Goals Progress towards PT goals: Progressing toward goals  Plan Discharge plan needs to be updated    Precautions / Restrictions Precautions Precautions: Fall Restrictions Weight Bearing Restrictions: No   Pertinent Vitals/Pain No c/o pain    Mobility  Bed Mobility Bed Mobility: Sit to Supine Sit to Supine: 1: +2 Total assist Sit to Supine: Patient Percentage: 30% Details for Bed Mobility Assistance: assist to get legs onto bed and move pt up in bed. Transfers Transfers: Sit to Stand;Stand to Sit;Stand Pivot Transfers Sit to Stand: 1: +2 Total assist;With upper extremity assist;From  bed Sit to Stand: Patient Percentage: 40% Stand to Sit: 1: +2 Total assist;With upper extremity assist;With armrests;To chair/3-in-1 Stand to Sit: Patient Percentage: 40% Stand Pivot Transfers: 1: +2 Total assist Stand Pivot Transfers: Patient Percentage: 40% Details for Transfer Assistance: max v/c's for sequencing, short shuffled steps Ambulation/Gait Ambulation/Gait Assistance: Not tested (comment)    Exercises     PT Diagnosis:    PT Problem List:   PT Treatment Interventions:     PT Goals (current goals can now be found in the care plan section) Acute Rehab PT Goals Patient Stated Goal: wife wants pt to be more I so he can go home. PT Goal Formulation: With patient Time For Goal Achievement: 02/16/13 Potential to Achieve Goals: Good  Visit Information  Last PT Received On: 02/10/13 Assistance Needed: +2 History of Present Illness: 60 male recently admitted and d/c for Fecal Impaction/Ab Pain/N/V S/P Success c SMOG Enema. Per MD he tried to keep him around and get therapy b/c he thought the pt was weak.  The pt worked with PT and was set up HHPT because he wanted to go home.  Pt has known IgG MM and started treatments c Revlimid/Velcade/ Decadron/Zometa last week and Rxs this week on hold.   pt was in ED this am and was admitted c generalized Weakness, Lightheaded, AFTT, No BM despite Laxatives, DeH, Anorexia, hypocalcemia, Orthostasis, and hypotension.  Denies N/V/Bloody stools Ab PAin, fevers, or CP/SOB or other issues. Wife states pt's weakness got worse last night. Had controlled slide to floor last  night and has had polyuria since last night as well. Patient woke up this morning he was too weak to stand    Subjective Data  Subjective: Very little verbalization Patient Stated Goal: wife wants pt to be more I so he can go home.   Cognition  Cognition Arousal/Alertness: Lethargic Behavior During Therapy: WFL for tasks assessed/performed Overall Cognitive Status:  Impaired/Different from baseline Area of Impairment: Problem solving;Attention Current Attention Level: Sustained Problem Solving: Slow processing;Requires verbal cues;Requires tactile cues General Comments: pt lethargic from pain medication    Balance  Balance Balance Assessed: Yes Static Sitting Balance Static Sitting - Balance Support: Bilateral upper extremity supported Static Sitting - Level of Assistance: 3: Mod assist Static Sitting - Comment/# of Minutes: vc to keep feet on floor. Pt with posterior lean Static Standing Balance Static Standing - Balance Support: Bilateral upper extremity supported Static Standing - Level of Assistance: 1: +2 Total assist Static Standing - Comment/# of Minutes: less than a minute. Pt with posterior lean. IV started leaking. Pt returned to sitting. Nursing notified.  End of Session PT - End of Session Equipment Utilized During Treatment: Gait belt Activity Tolerance: Patient limited by lethargy Patient left: in chair;with call bell/phone within reach Nurse Communication: Mobility status   GP     [name] 02/10/2013, 1:13 PM   [name], PT DPT [phone]

## 2013-02-10 NOTE — Progress Notes (Signed)
Rehab Admissions Coordinator Note:  Patient was screened by Trish Mage for appropriateness for an Inpatient Acute Rehab Consult.  At this time, an inpatient rehab consult has been ordered and completed by Dr. [name].  I will follow up for rehab needs.  Trish Mage 02/10/2013, 7:41 AM  I can be reached at [phone].

## 2013-02-10 NOTE — Progress Notes (Signed)
Patient ID: Shawn Chen, male   DOB: Oct 17, 1926, 79 y.o.   MRN: [id number] Subjective:  The patient is alert and pleasant. He looks well. He has no complaints.  Objective: Vital signs in last 24 hours: Temp:  [97.5 F (36.4 C)-98.4 F (36.9 C)] 97.5 F (36.4 C) (08/26 0400) Pulse Rate:  [62-93] 67 (08/26 0700) Resp:  [12-27] 15 (08/26 0700) BP: (94-144)/(49-102) 128/66 mmHg (08/26 0700) SpO2:  [97 %-100 %] 97 % (08/26 0700) Arterial Line BP: (149)/(56) 149/56 mmHg (08/25 0800)  Intake/Output from previous day: 08/25 0701 - 08/26 0700 In: 1500 [P.O.:600; I.V.:590; IV Piggyback:310] Out: 1710 [Urine:1710] Intake/Output this shift:    Physical exam the patient is alert and oriented x3. His pupils are equal. He has a minimal left hemiparasis. His dressing is clean and dry. He has had minimal output in his drain. I removed it.  Lab Results:  Recent Labs  02/08/13 1350  WBC 7.9  HGB 12.2*  HCT 36.0*  PLT 357   BMET  Recent Labs  02/08/13 1350 02/09/13 0200  NA 131* 129*  K 4.2 4.1  CL 100 101  CO2 21 18*  GLUCOSE 87 106*  BUN 22 18  CREATININE 0.91 0.76  CALCIUM 8.6 7.8*    Studies/Results: Dg Chest 2 View  02/08/2013   *RADIOLOGY REPORT*  Clinical Data: Generalized weakness.  Admission.  Current history multiple myeloma.  CHEST - 2 VIEW  Comparison: One-view chest x-ray 01/22/2013, 01/16/2013, 11/24/2012, 01/17/2010.  Findings: Cardiac silhouette moderately enlarged but stable.  Small to moderate sized hiatal hernia, unchanged.  Hilar and mediastinal contours otherwise unremarkable.  Pulmonary vascularity normal. Emphysematous changes in the upper lobes.  Probable calcified granuloma or hamartoma in the right mid lung on the PA image, likely the right middle lobe, unchanged since the June, 2014 examination.  No new pulmonary parenchymal abnormalities.  No pleural effusions.  Degenerative changes involving the thoracic spine and generalized osseous demineralization.   Old compression fracture of T12.  IMPRESSION: COPD/emphysema.  No acute cardiopulmonary disease.  Stable cardiomegaly.  Stable hiatal hernia.  Old compression fracture of T12.   Original Report Authenticated By: Hulan Saas, M.D.   Ct Head Wo Contrast  02/09/2013   CLINICAL DATA:  79 year old male status post craniotomy. Right subdural hematoma with recent increased weakness.  EXAM: CT HEAD WITHOUT CONTRAST  TECHNIQUE: Contiguous axial images were obtained from the base of the skull through the vertex without intravenous contrast.  COMPARISON:  02/08/2013 and earlier.  FINDINGS: Small right convexity craniotomy has been performed with right subdural drain placed. Postoperative changes to the skull and overlying soft tissues. Small volume pneumocephalus.  Elsewhere osseous structures are stable. Stable paranasal sinuses and mastoids. Negative orbits soft tissues.  Cavum septum pellucidum again incidentally noted. Extra-axial collection on the right has decreased in thickness, now up to 13 -17 mm, but appears somewhat more generalized. Density within the collection now is more heterogeneous.  Mass effect on the right hemisphere has decreased. Leftward midline shift is decreased to 5 mm (previously 7 mm). No intra-axial or intraventricular hemorrhage.  Stable gray-white matter differentiation throughout the brain. No ventriculomegaly. No evidence of cortically based acute infarction identified. Calcified atherosclerosis at the skull base. No suspicious intracranial vascular hyperdensity.  IMPRESSION: 1. Mixed density right subdural hematoma with decreased mass effect on the right hemisphere and decreased midline shift (now 5 mm) following right subdural drain placement. Residual collection is up to 17 mm. Small volume pneumocephalus.  2. No new  intracranial abnormality.   Electronically Signed   By: Augusto Gamble   On: 02/09/2013 08:07   Ct Head Wo Contrast  02/08/2013   *RADIOLOGY REPORT*  Clinical Data:   Increased weakness, tremors, history multiple myeloma, melanoma, left side weakness since Tuesday  CT HEAD WITHOUT CONTRAST  Technique:  Contiguous axial images were obtained from the base of the skull through the vertex without contrast.  Comparison: 11/22/2012  Findings: Generalized atrophy. Large low attenuation right frontoparietal subdural collection measuring up to 2.9 cm thick consistent with a chronic subdural hematoma. Significant mass effect upon right hemisphere with sulcal effacement, hemisphere compression, and 7 mm of right-to-left midline shift. Small vessel chronic ischemic changes of deep cerebral white matter. No definite intracranial hemorrhage, mass, or evidence of acute infarction. No high attenuation component to the above the subdural collection is identified. Atherosclerotic calcifications at skull base. Bones and sinuses unremarkable. Small bilateral mastoid effusions.  IMPRESSION: Large right frontoparietal chronic subdural hematoma 2.9 cm in thickness causing significant mass effect upon the right hemisphere and 7 mm of right-to-left midline shift.  Critical Value/emergent results were called by telephone at the time of interpretation on 02/08/2013 at 1400 hours to V. Pickering, who verbally acknowledged these results.   Original Report Authenticated By: Ulyses Southward, M.D.    Assessment/Plan: Postop day #2: The patient is doing well. I will transfer him to the floor and mobilize him with PT and OT. He may be able to go home in a few days versus rehabilitation. I'll ask rehabilitation to see the patient.  LOS: 2 days     Elenora Hawbaker D 02/10/2013, 7:53 AM

## 2013-02-10 NOTE — Progress Notes (Signed)
Rehab admissions - Evaluated for possible admission.  I spoke with patient's wife.  Patient was recently at [facility] and has also been to [facility] in the past.  Wife is interested in inpatient rehab.  I explained rehab vs SNF and I gave her rehab booklets.  I will begin authorization process with [facility] insurance carrier.  Call me for questions.  #[phone]

## 2013-02-11 ENCOUNTER — Inpatient Hospital Stay (HOSPITAL_COMMUNITY)
Admission: AD | Admit: 2013-02-11 | Discharge: 2013-02-19 | DRG: 945 | Disposition: A | Discharge status: Hospice | Payer: Medicare Other | Source: Intra-hospital | Attending: Physical Medicine & Rehabilitation | Admitting: Physical Medicine & Rehabilitation

## 2013-02-11 DIAGNOSIS — R569 Unspecified convulsions: Secondary | ICD-10-CM

## 2013-02-11 DIAGNOSIS — Z87891 Personal history of nicotine dependence: Secondary | ICD-10-CM

## 2013-02-11 DIAGNOSIS — C9000 Multiple myeloma not having achieved remission: Secondary | ICD-10-CM | POA: Diagnosis present

## 2013-02-11 DIAGNOSIS — S065X9A Traumatic subdural hemorrhage with loss of consciousness of unspecified duration, initial encounter: Secondary | ICD-10-CM | POA: Diagnosis present

## 2013-02-11 DIAGNOSIS — R063 Periodic breathing: Secondary | ICD-10-CM | POA: Diagnosis not present

## 2013-02-11 DIAGNOSIS — E871 Hypo-osmolality and hyponatremia: Secondary | ICD-10-CM | POA: Diagnosis present

## 2013-02-11 DIAGNOSIS — B965 Pseudomonas (aeruginosa) (mallei) (pseudomallei) as the cause of diseases classified elsewhere: Secondary | ICD-10-CM | POA: Diagnosis not present

## 2013-02-11 DIAGNOSIS — D649 Anemia, unspecified: Secondary | ICD-10-CM

## 2013-02-11 DIAGNOSIS — G8929 Other chronic pain: Secondary | ICD-10-CM | POA: Diagnosis present

## 2013-02-11 DIAGNOSIS — R1310 Dysphagia, unspecified: Secondary | ICD-10-CM | POA: Diagnosis not present

## 2013-02-11 DIAGNOSIS — Z9181 History of falling: Secondary | ICD-10-CM

## 2013-02-11 DIAGNOSIS — S069X9A Unspecified intracranial injury with loss of consciousness of unspecified duration, initial encounter: Secondary | ICD-10-CM

## 2013-02-11 DIAGNOSIS — S069XAA Unspecified intracranial injury with loss of consciousness status unknown, initial encounter: Secondary | ICD-10-CM

## 2013-02-11 DIAGNOSIS — K5641 Fecal impaction: Secondary | ICD-10-CM | POA: Diagnosis present

## 2013-02-11 DIAGNOSIS — Z66 Do not resuscitate: Secondary | ICD-10-CM | POA: Diagnosis not present

## 2013-02-11 DIAGNOSIS — K219 Gastro-esophageal reflux disease without esophagitis: Secondary | ICD-10-CM | POA: Diagnosis present

## 2013-02-11 DIAGNOSIS — W19XXXA Unspecified fall, initial encounter: Secondary | ICD-10-CM

## 2013-02-11 DIAGNOSIS — R531 Weakness: Secondary | ICD-10-CM

## 2013-02-11 DIAGNOSIS — Z515 Encounter for palliative care: Secondary | ICD-10-CM | POA: Diagnosis not present

## 2013-02-11 DIAGNOSIS — N390 Urinary tract infection, site not specified: Secondary | ICD-10-CM | POA: Diagnosis not present

## 2013-02-11 DIAGNOSIS — M549 Dorsalgia, unspecified: Secondary | ICD-10-CM | POA: Diagnosis present

## 2013-02-11 DIAGNOSIS — Z5189 Encounter for other specified aftercare: Principal | ICD-10-CM

## 2013-02-11 DIAGNOSIS — G9340 Encephalopathy, unspecified: Secondary | ICD-10-CM | POA: Diagnosis present

## 2013-02-11 DIAGNOSIS — I10 Essential (primary) hypertension: Secondary | ICD-10-CM | POA: Diagnosis present

## 2013-02-11 DIAGNOSIS — M069 Rheumatoid arthritis, unspecified: Secondary | ICD-10-CM | POA: Diagnosis present

## 2013-02-11 DIAGNOSIS — R404 Transient alteration of awareness: Secondary | ICD-10-CM | POA: Diagnosis not present

## 2013-02-11 DIAGNOSIS — S065XAA Traumatic subdural hemorrhage with loss of consciousness status unknown, initial encounter: Secondary | ICD-10-CM | POA: Diagnosis present

## 2013-02-11 DIAGNOSIS — G40909 Epilepsy, unspecified, not intractable, without status epilepticus: Secondary | ICD-10-CM | POA: Diagnosis present

## 2013-02-11 MED ORDER — GUAIFENESIN-DM 100-10 MG/5ML PO SYRP: 5.0000 mL | ORAL_SOLUTION | Freq: Four times a day (QID) | ORAL | Status: DC | PRN

## 2013-02-11 MED ORDER — ACETAMINOPHEN 325 MG PO TABS
325.0000 mg | ORAL_TABLET | ORAL | Status: DC | PRN
  Administered 2013-02-13 – 2013-02-14 (×2): 650 mg via ORAL
  Filled 2013-02-11 (×2): qty 2

## 2013-02-11 MED ORDER — ENSURE COMPLETE PO LIQD
237.0000 mL | Freq: Two times a day (BID) | ORAL | Status: DC
  Administered 2013-02-13 (×2): 237 mL via ORAL

## 2013-02-11 MED ORDER — MEGESTROL ACETATE 400 MG/10ML PO SUSP
400.0000 mg | Freq: Every day | ORAL | Status: DC
  Administered 2013-02-12 – 2013-02-13 (×2): 400 mg via ORAL
  Filled 2013-02-11 (×6): qty 10

## 2013-02-11 MED ORDER — HYDROCODONE-ACETAMINOPHEN 5-325 MG PO TABS
1.0000 | ORAL_TABLET | ORAL | Status: DC | PRN
  Administered 2013-02-11: 1 via ORAL
  Filled 2013-02-11: qty 1

## 2013-02-11 MED ORDER — POLYETHYLENE GLYCOL 3350 17 G PO PACK
17.0000 g | PACK | Freq: Two times a day (BID) | ORAL | Status: DC
  Administered 2013-02-11 – 2013-02-13 (×4): 17 g via ORAL
  Filled 2013-02-11 (×16): qty 1

## 2013-02-11 MED ORDER — BISACODYL 10 MG RE SUPP: 10.0000 mg | Freq: Every day | RECTAL | Status: DC | PRN

## 2013-02-11 MED ORDER — LEVETIRACETAM 500 MG PO TABS
500.0000 mg | ORAL_TABLET | Freq: Two times a day (BID) | ORAL | Status: DC
  Administered 2013-02-11 – 2013-02-13 (×4): 500 mg via ORAL
  Filled 2013-02-11 (×8): qty 1

## 2013-02-11 MED ORDER — TRAZODONE HCL 50 MG PO TABS: 25.0000 mg | ORAL_TABLET | Freq: Every evening | ORAL | Status: DC | PRN

## 2013-02-11 MED ORDER — ONDANSETRON HCL 4 MG PO TABS: 4.0000 mg | ORAL_TABLET | ORAL | Status: DC | PRN

## 2013-02-11 MED ORDER — FOLIC ACID 1 MG PO TABS
2.0000 mg | ORAL_TABLET | Freq: Every day | ORAL | Status: DC
  Administered 2013-02-12 – 2013-02-13 (×2): 2 mg via ORAL
  Filled 2013-02-11 (×8): qty 2

## 2013-02-11 MED ORDER — CITALOPRAM HYDROBROMIDE 10 MG PO TABS
10.0000 mg | ORAL_TABLET | Freq: Every day | ORAL | Status: DC
  Administered 2013-02-12 – 2013-02-13 (×2): 10 mg via ORAL
  Filled 2013-02-11 (×6): qty 1

## 2013-02-11 MED ORDER — PANTOPRAZOLE SODIUM 20 MG PO TBEC
20.0000 mg | DELAYED_RELEASE_TABLET | Freq: Every day | ORAL | Status: DC
  Administered 2013-02-12 – 2013-02-13 (×2): 20 mg via ORAL
  Filled 2013-02-11 (×8): qty 1

## 2013-02-11 MED ORDER — AMLODIPINE BESYLATE 5 MG PO TABS
5.0000 mg | ORAL_TABLET | Freq: Every day | ORAL | Status: DC
  Administered 2013-02-12 – 2013-02-13 (×2): 5 mg via ORAL
  Filled 2013-02-11 (×8): qty 1

## 2013-02-11 MED ORDER — ATORVASTATIN CALCIUM 20 MG PO TABS
20.0000 mg | ORAL_TABLET | Freq: Every day | ORAL | Status: DC
  Administered 2013-02-11 – 2013-02-13 (×3): 20 mg via ORAL
  Filled 2013-02-11 (×8): qty 1

## 2013-02-11 MED ORDER — ADULT MULTIVITAMIN W/MINERALS CH
1.0000 | ORAL_TABLET | Freq: Every day | ORAL | Status: DC
  Administered 2013-02-12 – 2013-02-13 (×2): 1 via ORAL
  Filled 2013-02-11 (×8): qty 1

## 2013-02-11 MED ORDER — METOPROLOL SUCCINATE ER 25 MG PO TB24
25.0000 mg | ORAL_TABLET | Freq: Every day | ORAL | Status: DC
  Administered 2013-02-12 – 2013-02-13 (×2): 25 mg via ORAL
  Filled 2013-02-11 (×4): qty 1

## 2013-02-11 MED ORDER — PRO-STAT SUGAR FREE PO LIQD
30.0000 mL | Freq: Every day | ORAL | Status: DC
  Administered 2013-02-12 – 2013-02-13 (×2): 30 mL via ORAL
  Filled 2013-02-11 (×9): qty 30

## 2013-02-11 MED ORDER — ALUM & MAG HYDROXIDE-SIMETH 200-200-20 MG/5ML PO SUSP: 30.0000 mL | ORAL | Status: DC | PRN

## 2013-02-11 MED ORDER — ONDANSETRON HCL 4 MG/2ML IJ SOLN: 4.0000 mg | INTRAMUSCULAR | Status: DC | PRN

## 2013-02-11 MED ORDER — FLEET ENEMA 7-19 GM/118ML RE ENEM: 1.0000 | ENEMA | Freq: Every day | RECTAL | Status: DC | PRN

## 2013-02-11 MED ORDER — VALACYCLOVIR HCL 500 MG PO TABS
1000.0000 mg | ORAL_TABLET | Freq: Every day | ORAL | Status: DC
  Administered 2013-02-12 – 2013-02-13 (×2): 1000 mg via ORAL
  Filled 2013-02-11 (×8): qty 2

## 2013-02-11 MED ORDER — ALPRAZOLAM 0.25 MG PO TABS: 0.2500 mg | ORAL_TABLET | Freq: Three times a day (TID) | ORAL | Status: DC | PRN

## 2013-02-11 NOTE — PMR Pre-admission (Signed)
PMR Admission Coordinator Pre-Admission Assessment  Patient: Shawn Chen is an 79 y.o., male MRN: [id number] DOB: 1927-03-23 Height: 5\' 10"  (177.8 cm) Weight: 65.5 kg (144 lb 6.4 oz)              Insurance Information HMO:  Yes    PPO:       PCP:       IPA:       80/20:       OTHER:  Group # [id number] PRIMARY: Blue Medicare      Policy#: [id number]      Subscriber: Shawn Chen CM Name:                                         Phone#:                                         Fax#: [phone] Pre-Cert#:                                                           Employer: Retired Benefits:  Phone #: [phone]     Name: Fredricka Bonine. Date: 06/18/12     Deduct: $0      Out of Pocket Max: $3400(met $1834)      Life Max: unlimited CIR: $170 days 1-7, $0 days 8+      SNF: $0 days 1-10, $50 days 11-100 Outpatient: no limits     Co-Pay: $35/visit Home Health: 100%      Co-Pay: none DME: 80%     Co-Pay: 20% Providers: in network  Emergency Contact Information Contact Information   Name Relation Home Work Mobile   [place] Spouse [phone]  [phone]   Yechiel, Erny [phone]  [phone]   Zebadiah, Willert Relative [phone]  [phone]   Gerrie Nordmann Relative   [phone]     Current Medical History  Patient Admitting Diagnosis:  Acute on Chronic R SDH s/p craniotomy  History of Present Illness:  An 79 y.o. right-handed male history of rheumatoid arthritis and multiple myeloma. Admitted 02/08/2013 history of right small subdural hematoma identified June 2014 and monitored conservatively. Presented with progressive left-sided weakness and altered mental status. Followup imaging shows large right fluid collection consistent with subdural hematoma. Underwent right craniotomy evacuation subdural hematoma per Dr. Lovell Sheehan 02/08/2013. Placed on Keppra for seizure prophylaxis. Physical and occupational therapy evaluations pending. M.D. has requested physical  medicine rehabilitation consult.    Past Medical History  Past Medical History  Diagnosis Date  . Diverticulosis of colon (without mention of hemorrhage)   . Esophagitis, unspecified   . 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 Personal history of colonic polyps   . Reflux   . Hernia   . RA (rheumatoid arthritis)   . Myeloma   . Multiple myeloma, without mention of having achieved remission(203.00) 01/05/2013  . Subdural hematoma 6/14    secondary to fall    Family History  family history is negative for Colon cancer.  Prior Rehab/Hospitalizations:  Was at [facility] until 02/08/13 for about 13 days.  Was at [facility] 4 yrs ago  after hip surgery and had HH therapies.   Current Medications  Current facility-administered medications:acetaminophen (TYLENOL) suppository 650 mg, 650 mg, Rectal, Q4H PRN, Cristi Loron, MD;  acetaminophen (TYLENOL) tablet 650 mg, 650 mg, Oral, Q4H PRN, Cristi Loron, MD;  ALPRAZolam Prudy Feeler) tablet 0.25 mg, 0.25 mg, Oral, TID PRN, Cristi Loron, MD;  amLODipine (NORVASC) tablet 5 mg, 5 mg, Oral, Daily, Cristi Loron, MD, 5 mg at 02/11/13 1045 atorvastatin (LIPITOR) tablet 20 mg, 20 mg, Oral, q1800, Lauren Bajbus, RPH, 20 mg at 02/10/13 1733;  bisacodyl (DULCOLAX) suppository 10 mg, 10 mg, Rectal, Daily PRN, Cristi Loron, MD;  citalopram (CELEXA) tablet 10 mg, 10 mg, Oral, Daily, Cristi Loron, MD, 10 mg at 02/11/13 1045;  feeding supplement (ENSURE COMPLETE) liquid 237 mL, 237 mL, Oral, BID BM, Cristi Loron, MD, 237 mL at 02/11/13 1045 feeding supplement (PRO-STAT SUGAR FREE 64) liquid 30 mL, 30 mL, Oral, Daily, Lauren Bajbus, RPH, 30 mL at 02/11/13 1045;  folic acid (FOLVITE) tablet 2 mg, 2 mg, Oral, Daily, Cristi Loron, MD, 2 mg at 02/11/13 1045;  HYDROcodone-acetaminophen (NORCO/VICODIN) 5-325 MG per tablet 1 tablet, 1 tablet, Oral, Q4H PRN, Cristi Loron, MD, 1 tablet at 02/11/13 1045 labetalol  (NORMODYNE,TRANDATE) injection 10-40 mg, 10-40 mg, Intravenous, Q10 min PRN, Cristi Loron, MD, 10 mg at 02/08/13 2127;  lactated ringers infusion, , Intravenous, Continuous, Cristi Loron, MD, Last Rate: 75 mL/hr at 02/08/13 2102;  levETIRAcetam (KEPPRA) 500 mg in sodium chloride 0.9 % 100 mL IVPB, 500 mg, Intravenous, Q12H, Cristi Loron, MD, 500 mg at 02/11/13 1047 megestrol (MEGACE) 400 MG/10ML suspension 400 mg, 400 mg, Oral, Daily, Cristi Loron, MD, 400 mg at 02/11/13 1045;  metoprolol succinate (TOPROL-XL) 24 hr tablet 25 mg, 25 mg, Oral, q morning - 10a, Cristi Loron, MD, 25 mg at 02/11/13 1045;  morphine 2 MG/ML injection 1-2 mg, 1-2 mg, Intravenous, Q2H PRN, Cristi Loron, MD, 2 mg at 02/10/13 0554 multivitamin with minerals tablet 1 tablet, 1 tablet, Oral, Daily, Cristi Loron, MD, 1 tablet at 02/11/13 1045;  ondansetron [facility]) injection 4 mg, 4 mg, Intravenous, Q4H PRN, Cristi Loron, MD;  ondansetron [facility]) tablet 4 mg, 4 mg, Oral, Q4H PRN, Cristi Loron, MD;  pantoprazole (PROTONIX) EC tablet 20 mg, 20 mg, Oral, Daily, Cristi Loron, MD, 20 mg at 02/11/13 1045 pantoprazole (PROTONIX) injection 40 mg, 40 mg, Intravenous, QHS, Cristi Loron, MD, 40 mg at 02/10/13 2221;  promethazine (PHENERGAN) tablet 12.5-25 mg, 12.5-25 mg, Oral, Q4H PRN, Cristi Loron, MD;  sodium phosphate (FLEET) 7-19 GM/118ML enema 1 enema, 1 enema, Rectal, Daily PRN, Lauren Bajbus, RPH;  valACYclovir (VALTREX) tablet 1,000 mg, 1,000 mg, Oral, Daily, Cristi Loron, MD, 1,000 mg at 02/11/13 1045  Patients Current Diet: General  Precautions / Restrictions Precautions Precautions: Fall Precaution Comments: pt with jp drain from craniotomy Restrictions Weight Bearing Restrictions: No   Prior Activity Level Limited Community (1-2x/wk): Went out 1-2 X a week.  Walked outside with a Social worker / Equipment Home Assistive Devices/Equipment: Dan Humphreys  (specify type) Home Equipment: Walker - 2 wheels;Cane - single point;Bedside commode  Prior Functional Level Prior Function Level of Independence: Needs assistance Gait / Transfers Assistance Needed: used RW ADL's / Homemaking Assistance Needed: wife assisted with LB dressing/bathing since last hospital admission  Current Functional Level Cognition  Overall Cognitive Status: Impaired/Different from baseline Current Attention Level: Sustained Orientation  Level: Oriented X4 General Comments: pt confused but he is realizing that today    Extremity Assessment (includes Sensation/Coordination)          ADLs  Eating/Feeding: Performed;Set up Where Assessed - Eating/Feeding: Chair Grooming: Simulated;Wash/dry face;Set up Where Assessed - Grooming: Supported sitting Upper Body Bathing: Simulated;Minimal assistance Where Assessed - Upper Body Bathing: Supported sitting Lower Body Bathing: Simulated;Maximal assistance Where Assessed - Lower Body Bathing: Supported sit to stand Upper Body Dressing: Simulated;Moderate assistance Where Assessed - Upper Body Dressing: Supported sitting Lower Body Dressing: Maximal assistance Where Assessed - Lower Body Dressing: Supported sit to Pharmacist, hospital: +2 Total assistance Toilet Transfer: Patient Percentage: 20% Statistician Method: Surveyor, minerals: Materials engineer and Hygiene: Simulated;+2 Total assistance Toileting - Architect and Hygiene: Patient Percentage: 20% Where Assessed - Glass blower/designer Manipulation and Hygiene: Standing Equipment Used: Gait belt Transfers/Ambulation Related to ADLs: pivot to chair with +2 assist.  Pt stood x2 with total a x2 as well. ADL Comments: pt very lethargic this session.  Pt attempted to donn socks but was unable.  Pt able to cross legs but just go too tired.      Mobility  Bed Mobility: Rolling Left;Left Sidelying to  Sit Rolling Left: 4: Min assist Rolling Left: Patient Percentage: 40% Left Sidelying to Sit: 4: Min assist;With rails Left Sidelying to Sit: Patient Percentage: 40% Sitting - Scoot to Edge of Bed: 4: Min assist Sit to Supine: 1: +2 Total assist Sit to Supine: Patient Percentage: 30%    Transfers  Transfers: Sit to Stand;Stand to Sit Sit to Stand: 1: +2 Total assist;With upper extremity assist Sit to Stand: Patient Percentage: 60% Stand to Sit: 4: Min assist Stand to Sit: Patient Percentage: 40% Stand Pivot Transfers: 1: +2 Total assist Stand Pivot Transfers: Patient Percentage: 40%    Ambulation / Gait / Stairs / Wheelchair Mobility  Ambulation/Gait Ambulation/Gait Assistance: 1: +2 Total assist Ambulation/Gait: Patient Percentage: 70% Ambulation Distance (Feet): 15 Feet Assistive device: Rolling walker Ambulation/Gait Assistance Details: small, shuffling steps, vc's for step length on left, tends to take very small step on left and then step again on right. Can correct this when cued though. vc's for upright posture. Gait Pattern: Decreased step length - left;Decreased stride length;Trunk flexed Gait velocity: decreased Stairs: No Wheelchair Mobility Wheelchair Mobility: No    Posture / Balance Static Sitting Balance Static Sitting - Balance Support: Feet supported;No upper extremity supported Static Sitting - Level of Assistance: 5: Stand by assistance Static Sitting - Comment/# of Minutes: left lean but corrects with vc's Static Standing Balance Static Standing - Balance Support: Bilateral upper extremity supported Static Standing - Level of Assistance: 4: Min assist Static Standing - Comment/# of Minutes: 3 min    Special needs/care consideration BiPAP/CPAP No CPM No Continuous Drip IV LR 75 ml/hr Dialysis No       Life Vest No Oxygen No Special Bed No Trach Size No Wound Vac (area) No   Skin Has R crani incision.  Has cancers Left scalp area followed by  dermatology               Bowel mgmt: No documented BM since 01/25/13.  Has constipation Bladder mgmt: Has condom catheter.  Incontinence of urine. Diabetic mgmt No    Previous Home Environment Living Arrangements: Spouse/significant other Available Help at Discharge: Family Type of Home: House Home Layout: Two level;Able to live on main level with bedroom/bathroom Home Access:  Stairs to enter Entrance Stairs-Rails: None Entrance Stairs-Number of Steps: 4-5 Bathroom Shower/Tub: Health visitor: Standard Home Care Services: No  Discharge Living Setting Plans for Discharge Living Setting: Patient's home;House;Lives with (comment) (Lives with wife.) Type of Home at Discharge: House Discharge Home Layout: Two level;Able to live on main level with bedroom/bathroom Alternate Level Stairs-Number of Steps: Flight Discharge Home Access: Stairs to enter [employer] of Steps: 2 steps at garage entry. Does the patient have any problems obtaining your medications?: No  Social/Family/Support Systems Patient Roles: Spouse;Parent (Has 2 sons and 2 dtr-in-laws.) Contact Information: Tredarius Cobern - spouse Anticipated Caregiver: Wife Anticipated Caregiver's Contact Information: [name] - wife (h) [phone] (c) [phone] Ability/Limitations of Caregiver: Wife can provide 24 hr supervision and assist. Caregiver Availability: 24/7 Discharge Plan Discussed with Primary Caregiver: Yes Is Caregiver In Agreement with Plan?: Yes Does Caregiver/Family have Issues with Lodging/Transportation while Pt is in Rehab?: No  Goals/Additional Needs Patient/Family Goal for Rehab: PT/OT S/Min A, ST S goals Expected length of stay: ? 2 weeks Cultural Considerations: Methodist.  Went to church on Sundays Dietary Needs: Regular, thin liquids Equipment Needs: TBD Pt/Family Agrees to Admission and willing to participate: Yes Program Orientation Provided & Reviewed with Pt/Caregiver  Including Roles  & Responsibilities: Yes  Decrease burden of Care through IP rehab admission: Decrease number of caregivers, Bowel and bladder program and Patient/family education  Possible need for SNF placement upon discharge: Yes, if he does not progress to where wife can manage at home after his inpatient rehab stay.  Patient Condition: This patient's medical and functional status has changed since the consult dated: 02/09/13 in which the Rehabilitation Physician determined and documented that the patient's condition is appropriate for intensive rehabilitative care in an inpatient rehabilitation facility. See "History of Present Illness" (above) for medical update. Functional changes are: Currently requiring total assist +2 70% ambulated 15 ft RW. [name] Patient's medical and functional status update has been discussed with the Rehabilitation physician and patient remains appropriate for inpatient rehabilitation. Will admit to inpatient rehab today.  Preadmission Screen Completed By:  Trish Mage, 02/11/2013 1:17 PM ______________________________________________________________________   Discussed status with Dr. [name] on 02/11/13 at 1327 and received telephone approval for admission today.  Admission Coordinator:  Trish Mage, time1327/Date08/27/14

## 2013-02-11 NOTE — Discharge Summary (Signed)
Physician Discharge Summary  Patient ID: NIXON KOLTON MRN: [id number] DOB/AGE: [birth year] 77 y.o.  Admit date: 02/08/2013 Discharge date: 02/11/2013  Admission Diagnoses: Right subdural hematoma  Discharge Diagnoses: The same Active Problems:   * No active hospital problems. *   Discharged Condition: fair  Hospital Course: I admitted the patient on 02/08/2013 with a diagnosis of right subdural hematoma. On that day I performed a right craniotomy for evacuation of subdural hematoma.  The patient's postoperative course was unremarkable. We had rehabilitation see him and he was taken to inpatient rehabilitation for further rehabilitation.  Consults: Rehabilitation Significant Diagnostic Studies: CAT scans Treatments: Right craniotomy for subdural hematoma Discharge Exam: Blood pressure 127/70, pulse 72, temperature 97.2 F (36.2 C), temperature source Oral, resp. rate 16, height 5\' 10"  (1.778 m), weight 65.5 kg (144 lb 6.4 oz), SpO2 95.00%. Patient is alert and pleasant. He is oriented x3. He is mildly left hemiparetic. His wound is healing well. His pupils are equal.  Disposition: Inpatient rehabilitation.  Discharge Orders   Future Orders Complete By Expires   Call MD for:  difficulty breathing, headache or visual disturbances  As directed    Call MD for:  extreme fatigue  As directed    Call MD for:  hives  As directed    Call MD for:  persistant dizziness or light-headedness  As directed    Call MD for:  persistant nausea and vomiting  As directed    Call MD for:  redness, tenderness, or signs of infection (pain, swelling, redness, odor or green/yellow discharge around incision site)  As directed    Call MD for:  severe uncontrolled pain  As directed    Call MD for:  temperature >100.4  As directed    Diet - low sodium heart healthy  As directed    Discharge instructions  As directed    Comments:     Call [phone] for a followup appointment. Take a stool softener  while you are using pain medications.   Driving Restrictions  As directed    Comments:     Do not drive for 2 weeks.   Increase activity slowly  As directed    Lifting restrictions  As directed    Comments:     Do not lift more than 5 pounds. No excessive bending or twisting.   May shower / Bathe  As directed    Comments:     He may shower after the pain she is removed 3 days after surgery. Leave the incision alone.   Remove dressing in 24 hours  As directed        Medication List         alendronate 70 MG tablet  Commonly known as:  FOSAMAX  Take 70 mg by mouth every 7 (seven) days. Take with a full glass of water on an empty stomach.     ALPRAZolam 0.25 MG tablet  Commonly known as:  XANAX  Take one tablet by mouth every 8 hours as needed     amLODipine 5 MG tablet  Commonly known as:  NORVASC  Take 1 tablet (5 mg total) by mouth daily.     aspirin EC 81 MG tablet  Take 162 mg by mouth daily.     bisacodyl 10 MG suppository  Commonly known as:  DULCOLAX  Place 10 mg rectally daily as needed for constipation.     citalopram 10 MG tablet  Commonly known as:  CELEXA  Take 10 mg  by mouth daily.     famciclovir 500 MG tablet  Commonly known as:  FAMVIR  Take 250 mg by mouth at bedtime.     feeding supplement Liqd  Take 237 mLs by mouth 2 (two) times daily between meals.     folic acid 1 MG tablet  Commonly known as:  FOLVITE  Take 2 tablets (2 mg total) by mouth daily.     loperamide 2 MG capsule  Commonly known as:  IMODIUM  Take 1 capsule (2 mg total) by mouth as needed for diarrhea or loose stools.     magnesium hydroxide 400 MG/5ML suspension  Commonly known as:  MILK OF MAGNESIA  Take 30 mLs by mouth daily as needed for constipation.     megestrol 400 MG/10ML suspension  Commonly known as:  MEGACE  Take 10 mLs (400 mg total) by mouth daily.     metoprolol succinate 50 MG 24 hr tablet  Commonly known as:  TOPROL-XL  Take 25 mg by mouth every morning.  Take with or immediately following a meal.     multivitamin with minerals Tabs tablet  Take 1 tablet by mouth daily.     ondansetron 8 MG disintegrating tablet  Commonly known as:  ZOFRAN-ODT  Take 1 tablet (8 mg total) by mouth every 12 (twelve) hours as needed for nausea.     pantoprazole 20 MG tablet  Commonly known as:  PROTONIX  Take 20 mg by mouth daily.     polyethylene glycol packet  Commonly known as:  MIRALAX / GLYCOLAX  Take 17 g by mouth daily.     protein supplement Powd  Take 1 scoop by mouth 2 (two) times daily.     RA SALINE ENEMA 19-7 GM/118ML Enem  Place 1 Bottle rectally daily as needed (for constipation).     simvastatin 40 MG tablet  Commonly known as:  ZOCOR  Take 40 mg by mouth daily.     tamsulosin 0.4 MG Caps capsule  Commonly known as:  FLOMAX  Take 0.4 mg by mouth daily.         SignedCristi Loron 02/11/2013, 11:27 AM

## 2013-02-11 NOTE — Progress Notes (Signed)
Physical Therapy Treatment Patient Details Name: Shawn Chen MRN: [id number] DOB: 07/26/1926 Today's Date: 02/11/2013 Time: 7829-5621 PT Time Calculation (min): 23 min  PT Assessment / Plan / Recommendation  History of Present Illness 52 male recently admitted and d/c for Fecal Impaction/Ab Pain/N/V S/P Success c SMOG Enema. Per MD he tried to keep him around and get therapy b/c he thought the pt was weak.  The pt worked with PT and was set up HHPT because he wanted to go home.  Pt has known IgG MM and started treatments c Revlimid/Velcade/ Decadron/Zometa last week and Rxs this week on hold.   pt was in ED this am and was admitted c generalized Weakness, Lightheaded, AFTT, No BM despite Laxatives, DeH, Anorexia, hypocalcemia, Orthostasis, and hypotension.  Denies N/V/Bloody stools Ab PAin, fevers, or CP/SOB or other issues. Wife states pt's weakness got worse last night. Had controlled slide to floor last night and has had polyuria since last night as well. Patient woke up this morning he was too weak to stand   PT Comments   Pt improving with mobility, ambulated 15' with RW and +2 tot A, pt 70%. Pt left in chair with breakfast and was feeding self. Pt confused today but realizes this and demonstrated STM carryover with safety precautions with transfers. PT will continue to follow.   Follow Up Recommendations  CIR     Does the patient have the potential to tolerate intense rehabilitation     Barriers to Discharge        Equipment Recommendations  None recommended by PT    Recommendations for Other Services OT consult  Frequency Min 4X/week   Progress towards PT Goals Progress towards PT goals: Progressing toward goals  Plan Current plan remains appropriate    Precautions / Restrictions Precautions Precautions: Fall Restrictions Weight Bearing Restrictions: No   Pertinent Vitals/Pain No c.o pain    Mobility  Bed Mobility Bed Mobility: Rolling Left;Left Sidelying to  Sit Rolling Left: 4: Min assist Left Sidelying to Sit: 4: Min assist;With rails Sitting - Scoot to [place] of Bed: 4: Min assist Details for Bed Mobility Assistance: pt requiring much less assist with bed mobility today, min A to gets legs all the way off bed and then to let go of rail with right hand to wt shift to right for full upright sitting Transfers Transfers: Sit to Stand;Stand to Sit Sit to Stand: 1: +2 Total assist;With upper extremity assist Sit to Stand: Patient Percentage: 60% Stand to Sit: 4: Min assist Details for Transfer Assistance: vc's for sequencing and hand placement, +2 assist to steady. With sit to stand, pt able to remember that he needed to reach back for chair when questioned  Ambulation/Gait Ambulation/Gait Assistance: 1: +2 Total assist Ambulation/Gait: Patient Percentage: 70% Ambulation Distance (Feet): 15 Feet Assistive device: Rolling walker Ambulation/Gait Assistance Details: small, shuffling steps, vc's for step length on left, tends to take very small step on left and then step again on right. Can correct this when cued though. vc's for upright posture. Gait Pattern: Decreased step length - left;Decreased stride length;Trunk flexed Gait velocity: decreased Stairs: No Wheelchair Mobility Wheelchair Mobility: No Modified Rankin (Stroke Patients Only) Pre-Morbid Rankin Score: Moderate disability Modified Rankin: Moderately severe disability    Exercises     PT Diagnosis:    PT Problem List:   PT Treatment Interventions:     PT Goals (current goals can now be found in the care plan section) Acute Rehab PT Goals  Patient Stated Goal: wife wants pt to be more I so he can go home. PT Goal Formulation: With patient Time For Goal Achievement: 02/16/13 Potential to Achieve Goals: Good  Visit Information  Last PT Received On: 02/11/13 Assistance Needed: +2 History of Present Illness: 26 male recently admitted and d/c for Fecal Impaction/Ab Pain/N/V S/P  Success c SMOG Enema. Per MD he tried to keep him around and get therapy b/c he thought the pt was weak.  The pt worked with PT and was set up HHPT because he wanted to go home.  Pt has known IgG MM and started treatments c Revlimid/Velcade/ Decadron/Zometa last week and Rxs this week on hold.   pt was in ED this am and was admitted c generalized Weakness, Lightheaded, AFTT, No BM despite Laxatives, DeH, Anorexia, hypocalcemia, Orthostasis, and hypotension.  Denies N/V/Bloody stools Ab PAin, fevers, or CP/SOB or other issues. Wife states pt's weakness got worse last night. Had controlled slide to floor last night and has had polyuria since last night as well. Patient woke up this morning he was too weak to stand    Subjective Data  Subjective: still with minimal verbalization but appropriate, realized he was confused and wanted to know where he was Patient Stated Goal: wife wants pt to be more I so he can go home.   Cognition  Cognition Arousal/Alertness: Awake/alert Behavior During Therapy: WFL for tasks assessed/performed Overall Cognitive Status: Impaired/Different from baseline Area of Impairment: Problem solving;Attention Current Attention Level: Sustained Memory: Decreased short-term memory Problem Solving: Slow processing;Requires verbal cues;Requires tactile cues General Comments: pt confused but he is realizing that today    Balance  Balance Balance Assessed: Yes Static Sitting Balance Static Sitting - Balance Support: Feet supported;No upper extremity supported Static Sitting - Level of Assistance: 5: Stand by assistance Static Sitting - Comment/# of Minutes: left lean but corrects with vc's Static Standing Balance Static Standing - Balance Support: Bilateral upper extremity supported Static Standing - Level of Assistance: 4: Min assist Static Standing - Comment/# of Minutes: 3 min  End of Session PT - End of Session Equipment Utilized During Treatment: Gait belt Activity  Tolerance: Patient tolerated treatment well Patient left: in chair;with call bell/phone within reach Nurse Communication: Mobility status   GP    Lyanne Co, PT  Acute Rehab Services  [phone]  Lyanne Co 02/11/2013, 12:00 PM

## 2013-02-11 NOTE — H&P (View-Only) (Signed)
Physical Medicine and Rehabilitation Admission H&P    Chief Complaint  Patient presents with  . Weakness  . cancer pt   : HPI: Mr  Shawn Chen is an 79 year old male with history of RA, Myeloma, small SDH dx 6/14. Patient with history of N/V and severe fecal impaction as well as recent fall. He was admitted on 02/08/13 with inability to walk and left sided weakness due to large R frontoparietal chronic SDH with significant mass effect and right to left shift. He was evaluated by Dr. Lovell Sheehan and underwent right crani for evacuation on the same day. Post op placed on Keppra for seizure prophylaxis. Dr Myna Hidalgo consulted for input and felt that bleed not due to underlying coagulopathy.  Therapies initiated and patient limited by intermittent lethargy as well as problems with sequencing. CIR recommended by therapy team.    Review of Systems  HENT: Negative for hearing loss.   Eyes: Negative for blurred vision and double vision.  Respiratory: Negative for cough and shortness of breath.   Cardiovascular: Negative for chest pain and palpitations.  Gastrointestinal: Positive for constipation. Negative for heartburn, nausea and abdominal pain.  Neurological: Positive for headaches.   Past Medical History  Diagnosis Date  . Diverticulosis of colon (without mention of hemorrhage)   . Esophagitis, unspecified   . 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 Personal history of colonic polyps   . Reflux   . Hernia   . RA (rheumatoid arthritis)   . Myeloma   . Multiple myeloma, without mention of having achieved remission(203.00) 01/05/2013  . Subdural hematoma 6/14    secondary to fall   Past Surgical History  Procedure Laterality Date  . Inguinal hernia repair  03/28/1990    dr price  . Orif hip fracture    . Craniotomy Right 02/08/2013    Procedure: Right CRANIOTOMY for HEMATOMA EVACUATION SUBDURAL;  Surgeon: Cristi Loron, MD;  Location: [facility];  Service: Neurosurgery;   Laterality: Right;   Family History  Problem Relation Age of Onset  . Colon cancer Neg Hx    Social History: Married. 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 Wife with heart issues but has tow supportive daughters (one a Engineer, civil (consulting)). He  reports that he quit smoking about 22 years ago. He has never used smokeless tobacco. He reports that he does not drink alcohol or use illicit drugs.  Allergies: No Known Allergies  Medications Prior to Admission  Medication Sig Dispense Refill  . alendronate (FOSAMAX) 70 MG tablet Take 70 mg by mouth every 7 (seven) days. Take with a full glass of water on an empty stomach.      [name] amLODipine (NORVASC) 5 MG tablet Take 1 tablet (5 mg total) by mouth daily.  30 tablet  6  . aspirin EC 81 MG tablet Take 162 mg by mouth daily.      . bisacodyl (DULCOLAX) 10 MG suppository Place 10 mg rectally daily as needed for constipation.      . citalopram (CELEXA) 10 MG tablet Take 10 mg by mouth daily.       . famciclovir (FAMVIR) 500 MG tablet Take 250 mg by mouth at bedtime.      . feeding supplement (ENSURE COMPLETE) LIQD Take 237 mLs by mouth 2 (two) times daily between meals.  60 Bottle  3  . folic acid (FOLVITE) 1 MG tablet Take 2 tablets (2 mg total) by mouth daily.  30 tablet  6  . loperamide (IMODIUM) 2 MG capsule Take 1 capsule (2 mg total) by mouth as needed for diarrhea or loose stools.  30 capsule  0  . magnesium hydroxide (MILK OF MAGNESIA) 400 MG/5ML suspension Take 30 mLs by mouth daily as needed for constipation.      . megestrol (MEGACE) 400 MG/10ML suspension Take 10 mLs (400 mg total) by mouth daily.  240 mL  0  . metoprolol succinate (TOPROL-XL) 50 MG 24 hr tablet Take 25 mg by mouth every morning. Take with or immediately following a meal.      . Multiple Vitamin (MULTIVITAMIN WITH MINERALS) TABS Take 1 tablet by mouth daily.      . ondansetron (ZOFRAN-ODT) 8 MG disintegrating tablet Take 1 tablet (8 mg total) by mouth every 12 (twelve) hours  as needed for nausea.  20 tablet  0  . pantoprazole (PROTONIX) 20 MG tablet Take 20 mg by mouth daily.      . polyethylene glycol (MIRALAX / GLYCOLAX) packet Take 17 g by mouth daily.  5 each  0  . protein supplement (RESOURCE BENEPROTEIN) POWD Take 1 scoop by mouth 2 (two) times daily.      . simvastatin (ZOCOR) 40 MG tablet Take 40 mg by mouth daily.        . Sodium Phosphates (RA SALINE ENEMA) 19-7 GM/118ML ENEM Place 1 Bottle rectally daily as needed (for constipation).      . tamsulosin (FLOMAX) 0.4 MG CAPS Take 0.4 mg by mouth daily.        Home: Home Living Family/patient expects to be discharged to:: Inpatient rehab Living Arrangements: Spouse/significant other Available Help at Discharge: Family Type of Home: House Home Access: Stairs to enter [employer] of Steps: 4-5 Entrance Stairs-Rails: None Home Layout: Two level;Able to live on main level with bedroom/bathroom Home Equipment: Dan Humphreys - 2 wheels;Cane - single point;Bedside commode   Functional History:    Functional Status:  Mobility: Bed Mobility Bed Mobility: Rolling Left;Left Sidelying to Sit Rolling Left: 4: Min assist Rolling Left: Patient Percentage: 40% Left Sidelying to Sit: 4: Min assist;With rails Left Sidelying to Sit: Patient Percentage: 40% Sitting - Scoot to Edge of Bed: 4: Min assist Sit to Supine: 1: +2 Total assist Sit to Supine: Patient Percentage: 30% Transfers Transfers: Sit to Stand;Stand to Sit Sit to Stand: 1: +2 Total assist;With upper extremity assist Sit to Stand: Patient Percentage: 60% Stand to Sit: 4: Min assist Stand to Sit: Patient Percentage: 40% Stand Pivot Transfers: 1: +2 Total assist Stand Pivot Transfers: Patient Percentage: 40% Ambulation/Gait Ambulation/Gait Assistance: 1: +2 Total assist Ambulation/Gait: Patient Percentage: 70% Ambulation Distance (Feet): 15 Feet Assistive device: Rolling walker Ambulation/Gait Assistance Details: small, shuffling steps,  vc's for step length on left, tends to take very small step on left and then step again on right. Can correct this when cued though. vc's for upright posture. Gait Pattern: Decreased step length - left;Decreased stride length;Trunk flexed Gait velocity: decreased Stairs: No Wheelchair Mobility Wheelchair Mobility: No  ADL: ADL Eating/Feeding: Performed;Set up Where Assessed - Eating/Feeding: Chair Grooming: Simulated;Wash/dry face;Set up Where Assessed - Grooming: Supported sitting Upper Body Bathing: Simulated;Minimal assistance Where Assessed - Upper Body Bathing: Supported sitting Lower Body Bathing: Simulated;Maximal assistance Where Assessed - Lower Body Bathing: Supported sit to stand Upper Body Dressing: Simulated;Moderate assistance Where Assessed - Upper Body Dressing: Supported sitting Lower Body Dressing: Maximal assistance Where Assessed - Lower Body Dressing: Supported sit to Pharmacist, hospital: +2 Total assistance  Toilet Transfer Method: Surveyor, minerals: Customer service manager Used: Gait belt Transfers/Ambulation Related to ADLs: pivot to chair with +2 assist.  Pt stood x2 with total a x2 as well. ADL Comments: pt very lethargic this session.  Pt attempted to donn socks but was unable.  Pt able to cross legs but just go too tired.    Cognition: Cognition Overall Cognitive Status: Impaired/Different from baseline Orientation Level: Oriented X4 Cognition Arousal/Alertness: Awake/alert Behavior During Therapy: WFL for tasks assessed/performed Overall Cognitive Status: Impaired/Different from baseline Area of Impairment: Problem solving;Attention Current Attention Level: Sustained Memory: Decreased short-term memory Problem Solving: Slow processing;Requires verbal cues;Requires tactile cues General Comments: pt confused but he is realizing that today  Physical Exam: Blood pressure 127/70, pulse 72, temperature 97.2 F (36.2 C),  temperature source Oral, resp. rate 16, height 5\' 10"  (1.778 m), weight 65.5 kg (144 lb 6.4 oz), SpO2 95.00%.   Physical Exam  Nursing note and vitals reviewed. Constitutional: He appears well-developed and well-nourished. No distress.  HENT:  Head: Normocephalic.  Right crani incision with staples in place and dry crusted blood.   Eyes: Conjunctivae and EOM are normal. Pupils are equal, round, and reactive to light. Right eye exhibits no discharge. Left eye exhibits no discharge. No scleral icterus.  Neck: Normal range of motion. Neck supple. No JVD present. No tracheal deviation present. No thyromegaly present.  Cardiovascular: Normal rate and regular rhythm.  Exam reveals no gallop and no friction rub.   No murmur heard. Pulmonary/Chest: Effort normal and breath sounds normal. No stridor. No respiratory distress. He has no wheezes. He has no rales. He exhibits no tenderness.  Abdominal: Soft. Bowel sounds are normal. He exhibits no distension and no mass. There is no tenderness. There is no rebound and no guarding.  Musculoskeletal: He exhibits no edema and no tenderness.  Lymphadenopathy:    He has no cervical adenopathy.  Neurological: He is alert. He exhibits normal muscle tone.  Alert and follows commands without difficulty. He displays a left central 7 and slurring of his speech, but his speech is quite intelligible. His sitting balance was fair to good. He difficulty with initiating and coordinating movement of his LUE more than LLE. Some apraxia was definitely seen. LUE 2+ to 3- at deltoid, tricep, biceps, 2 to 2+with HI, wrist. Senses pain in left arm and leg. LLE is 3 to 3+ with HF, KE, ADF and APF. Right upper extremity and lower grossly 4/5. OVerall insight and awareness are fair although mental processing is overall slowed.   Skin: Skin is warm and dry. He is not diaphoretic.    No results found for this or any previous visit (from the past 48 hour(s)). No results  found.  Post Admission Physician Evaluation: 1. Functional deficits secondary  to right acute on chronic fronto-parietal SDH. 2. Patient is admitted to receive collaborative, interdisciplinary care between the physiatrist, rehab nursing staff, and therapy team. 3. Patient's level of medical complexity and substantial therapy needs in context of that medical necessity cannot be provided at a lesser intensity of care such as a SNF. 4. Patient has experienced substantial functional loss from his/her baseline which was documented above under the "Functional History" and "Functional Status" headings.  Judging by the patient's diagnosis, physical exam, and functional history, the patient has potential for functional progress which will result in measurable gains while on inpatient rehab.  These gains will be of substantial and practical use upon discharge  in facilitating mobility  and self-care at the household level. 5. Physiatrist will provide 24 hour management of medical needs as well as oversight of the therapy plan/treatment and provide guidance as appropriate regarding the interaction of the two. 6. 24 hour rehab nursing will assist with bladder management, bowel management, safety, skin/wound care, disease management, medication administration, pain management and patient education  and help integrate therapy concepts, techniques,education, etc. 7. PT will assess and treat for/with: Lower extremity strength, range of motion, stamina, balance, functional mobility, safety, adaptive techniques and equipment, NMR, visual and cognitive perceptual awareness, education .   Goals are: supervision to minimal assist. 8. OT will assess and treat for/with: ADL's, functional mobility, safety, upper extremity strength, adaptive techniques and equipment, NMR, cognitive and visual perceptual rx, education.   Goals are: supervision to minimal assist. 9. SLP will assess and treat for/with: cognition, communication.   Goals are: supervision to minimal assist. 10. Case Management and Social Worker will assess and treat for psychological issues and discharge planning. 11. Team conference will be held weekly to assess progress toward goals and to determine barriers to discharge. 12. Patient will receive at least 3 hours of therapy per day at least 5 days per week. 13. ELOS: 2 to 3 weeks       14. Prognosis:  excellent   Medical Problem List and Plan: 1. DVT Prophylaxis/Anticoagulation: Mechanical: Sequential compression devices, below knee Bilateral lower extremities 2. Chronic back pain/Pain Management: Uses aleve and "salve" at home.  3. Mood:Continue celexa. LCSW to follow for evaluation.  4. Neuropsych: This patient is intermittently capable of making decisions on his own behalf. 6. Multiple Myeloma: recent diagnosis. Chemo on hold currently. 7. GERD:  Continue protonix. On megace due to poor appetite recently.  8. HTN:  Will monitor with bid checks. Continue norvasc and metoprolol. 9. H/o severe constipation: will set bowel program to prevent recurrence.  10  Hyponatremia: Appears to be chronic. Will recheck in am and follow along. Check cortisol level.    Ranelle Oyster, MD, [facility] Physical Medicine & Rehabilitation  02/11/2013

## 2013-02-11 NOTE — Progress Notes (Signed)
Rehab admissions - I have authorization and will admit to acute inpatient rehab today.  Wife is in agreement.  Call me for questions.  #[phone]

## 2013-02-11 NOTE — Interval H&P Note (Signed)
Shawn Chen was admitted today to Inpatient Rehabilitation with the diagnosis of right fronto-parietal SDH.  The patient's history has been reviewed, patient examined, and there is no change in status.  Patient continues to be appropriate for intensive inpatient rehabilitation.  I have reviewed the patient's chart and labs.  Questions were answered to the patient's satisfaction.  SWARTZ,ZACHARY T 02/11/2013, 5:17 PM

## 2013-02-11 NOTE — Progress Notes (Signed)
Pt admitted to 4W13 from 4N ; oriented x3, drowsy, awakens easily. C/O headache, 1 Vicodin given, effective. Wife at [facility]; pt and wife oriented to room and rehab process; safety plan reviewed, admission initiated. Add: Noted cough with dinner; cues for smaller bites.

## 2013-02-11 NOTE — H&P (Signed)
Physical Medicine and Rehabilitation Admission H&P    Chief Complaint  Patient presents with  . Weakness  . cancer pt   : HPI: Mr  Fields Gallion is an 79 year old male with history of RA, Myeloma, small SDH dx 6/14. Patient with history of N/V and severe fecal impaction as well as recent fall. He was admitted on 02/08/13 with inability to walk and left sided weakness due to large R frontoparietal chronic SDH with significant mass effect and right to left shift. He was evaluated by Dr. Jenkins and underwent right crani for evacuation on the same day. Post op placed on Keppra for seizure prophylaxis. Dr Ennever consulted for input and felt that bleed not due to underlying coagulopathy.  Therapies initiated and patient limited by intermittent lethargy as well as problems with sequencing. CIR recommended by therapy team.    Review of Systems  HENT: Negative for hearing loss.   Eyes: Negative for blurred vision and double vision.  Respiratory: Negative for cough and shortness of breath.   Cardiovascular: Negative for chest pain and palpitations.  Gastrointestinal: Positive for constipation. Negative for heartburn, nausea and abdominal pain.  Neurological: Positive for headaches.   Past Medical History  Diagnosis Date  . Diverticulosis of colon (without mention of hemorrhage)   . Esophagitis, unspecified   . 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 Personal history of colonic polyps   . Reflux   . Hernia   . RA (rheumatoid arthritis)   . Myeloma   . Multiple myeloma, without mention of having achieved remission(203.00) 01/05/2013  . Subdural hematoma 6/14    secondary to fall   Past Surgical History  Procedure Laterality Date  . Inguinal hernia repair  03/28/1990    dr price  . Orif hip fracture    . Craniotomy Right 02/08/2013    Procedure: Right CRANIOTOMY for HEMATOMA EVACUATION SUBDURAL;  Surgeon: Jeffrey D Jenkins, MD;  Location: [facility];  Service: Neurosurgery;   Laterality: Right;   Family History  Problem Relation Age of Onset  . Colon cancer Neg Hx    Social History: Married. 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 Wife with heart issues but has tow supportive daughters (one a nurse). He  reports that he quit smoking about 22 years ago. He has never used smokeless tobacco. He reports that he does not drink alcohol or use illicit drugs.  Allergies: No Known Allergies  Medications Prior to Admission  Medication Sig Dispense Refill  . alendronate (FOSAMAX) 70 MG tablet Take 70 mg by mouth every 7 (seven) days. Take with a full glass of water on an empty stomach.      . amLODipine (NORVASC) 5 MG tablet Take 1 tablet (5 mg total) by mouth daily.  30 tablet  6  . aspirin EC 81 MG tablet Take 162 mg by mouth daily.      . bisacodyl (DULCOLAX) 10 MG suppository Place 10 mg rectally daily as needed for constipation.      . citalopram (CELEXA) 10 MG tablet Take 10 mg by mouth daily.       . famciclovir (FAMVIR) 500 MG tablet Take 250 mg by mouth at bedtime.      . feeding supplement (ENSURE COMPLETE) LIQD Take 237 mLs by mouth 2 (two) times daily between meals.  60 Bottle  3  . folic acid (FOLVITE) 1 MG tablet Take 2 tablets (2 mg total) by mouth daily.  30 tablet    6  . loperamide (IMODIUM) 2 MG capsule Take 1 capsule (2 mg total) by mouth as needed for diarrhea or loose stools.  30 capsule  0  . magnesium hydroxide (MILK OF MAGNESIA) 400 MG/5ML suspension Take 30 mLs by mouth daily as needed for constipation.      . megestrol (MEGACE) 400 MG/10ML suspension Take 10 mLs (400 mg total) by mouth daily.  240 mL  0  . metoprolol succinate (TOPROL-XL) 50 MG 24 hr tablet Take 25 mg by mouth every morning. Take with or immediately following a meal.      . Multiple Vitamin (MULTIVITAMIN WITH MINERALS) TABS Take 1 tablet by mouth daily.      . ondansetron (ZOFRAN-ODT) 8 MG disintegrating tablet Take 1 tablet (8 mg total) by mouth every 12 (twelve) hours  as needed for nausea.  20 tablet  0  . pantoprazole (PROTONIX) 20 MG tablet Take 20 mg by mouth daily.      . polyethylene glycol (MIRALAX / GLYCOLAX) packet Take 17 g by mouth daily.  5 each  0  . protein supplement (RESOURCE BENEPROTEIN) POWD Take 1 scoop by mouth 2 (two) times daily.      . simvastatin (ZOCOR) 40 MG tablet Take 40 mg by mouth daily.        . Sodium Phosphates (RA SALINE ENEMA) 19-7 GM/118ML ENEM Place 1 Bottle rectally daily as needed (for constipation).      . tamsulosin (FLOMAX) 0.4 MG CAPS Take 0.4 mg by mouth daily.        Home: Home Living Family/patient expects to be discharged to:: Inpatient rehab Living Arrangements: Spouse/significant other Available Help at Discharge: Family Type of Home: House Home Access: Stairs to enter Entrance Stairs-Number of Steps: 4-5 Entrance Stairs-Rails: None Home Layout: Two level;Able to live on main level with bedroom/bathroom Home Equipment: Walker - 2 wheels;Cane - single point;Bedside commode   Functional History:    Functional Status:  Mobility: Bed Mobility Bed Mobility: Rolling Left;Left Sidelying to Sit Rolling Left: 4: Min assist Rolling Left: Patient Percentage: 40% Left Sidelying to Sit: 4: Min assist;With rails Left Sidelying to Sit: Patient Percentage: 40% Sitting - Scoot to Edge of Bed: 4: Min assist Sit to Supine: 1: +2 Total assist Sit to Supine: Patient Percentage: 30% Transfers Transfers: Sit to Stand;Stand to Sit Sit to Stand: 1: +2 Total assist;With upper extremity assist Sit to Stand: Patient Percentage: 60% Stand to Sit: 4: Min assist Stand to Sit: Patient Percentage: 40% Stand Pivot Transfers: 1: +2 Total assist Stand Pivot Transfers: Patient Percentage: 40% Ambulation/Gait Ambulation/Gait Assistance: 1: +2 Total assist Ambulation/Gait: Patient Percentage: 70% Ambulation Distance (Feet): 15 Feet Assistive device: Rolling walker Ambulation/Gait Assistance Details: small, shuffling steps,  vc's for step length on left, tends to take very small step on left and then step again on right. Can correct this when cued though. vc's for upright posture. Gait Pattern: Decreased step length - left;Decreased stride length;Trunk flexed Gait velocity: decreased Stairs: No Wheelchair Mobility Wheelchair Mobility: No  ADL: ADL Eating/Feeding: Performed;Set up Where Assessed - Eating/Feeding: Chair Grooming: Simulated;Wash/dry face;Set up Where Assessed - Grooming: Supported sitting Upper Body Bathing: Simulated;Minimal assistance Where Assessed - Upper Body Bathing: Supported sitting Lower Body Bathing: Simulated;Maximal assistance Where Assessed - Lower Body Bathing: Supported sit to stand Upper Body Dressing: Simulated;Moderate assistance Where Assessed - Upper Body Dressing: Supported sitting Lower Body Dressing: Maximal assistance Where Assessed - Lower Body Dressing: Supported sit to stand Toilet Transfer: +2 Total assistance   Toilet Transfer Method: Stand pivot Toilet Transfer Equipment: Bedside commode Equipment Used: Gait belt Transfers/Ambulation Related to ADLs: pivot to chair with +2 assist.  Pt stood x2 with total a x2 as well. ADL Comments: pt very lethargic this session.  Pt attempted to donn socks but was unable.  Pt able to cross legs but just go too tired.    Cognition: Cognition Overall Cognitive Status: Impaired/Different from baseline Orientation Level: Oriented X4 Cognition Arousal/Alertness: Awake/alert Behavior During Therapy: WFL for tasks assessed/performed Overall Cognitive Status: Impaired/Different from baseline Area of Impairment: Problem solving;Attention Current Attention Level: Sustained Memory: Decreased short-term memory Problem Solving: Slow processing;Requires verbal cues;Requires tactile cues General Comments: pt confused but he is realizing that today  Physical Exam: Blood pressure 127/70, pulse 72, temperature 97.2 F (36.2 C),  temperature source Oral, resp. rate 16, height 5' 10" (1.778 m), weight 65.5 kg (144 lb 6.4 oz), SpO2 95.00%.   Physical Exam  Nursing note and vitals reviewed. Constitutional: He appears well-developed and well-nourished. No distress.  HENT:  Head: Normocephalic.  Right crani incision with staples in place and dry crusted blood.   Eyes: Conjunctivae and EOM are normal. Pupils are equal, round, and reactive to light. Right eye exhibits no discharge. Left eye exhibits no discharge. No scleral icterus.  Neck: Normal range of motion. Neck supple. No JVD present. No tracheal deviation present. No thyromegaly present.  Cardiovascular: Normal rate and regular rhythm.  Exam reveals no gallop and no friction rub.   No murmur heard. Pulmonary/Chest: Effort normal and breath sounds normal. No stridor. No respiratory distress. He has no wheezes. He has no rales. He exhibits no tenderness.  Abdominal: Soft. Bowel sounds are normal. He exhibits no distension and no mass. There is no tenderness. There is no rebound and no guarding.  Musculoskeletal: He exhibits no edema and no tenderness.  Lymphadenopathy:    He has no cervical adenopathy.  Neurological: He is alert. He exhibits normal muscle tone.  Alert and follows commands without difficulty. He displays a left central 7 and slurring of his speech, but his speech is quite intelligible. His sitting balance was fair to good. He difficulty with initiating and coordinating movement of his LUE more than LLE. Some apraxia was definitely seen. LUE 2+ to 3- at deltoid, tricep, biceps, 2 to 2+with HI, wrist. Senses pain in left arm and leg. LLE is 3 to 3+ with HF, KE, ADF and APF. Right upper extremity and lower grossly 4/5. OVerall insight and awareness are fair although mental processing is overall slowed.   Skin: Skin is warm and dry. He is not diaphoretic.    No results found for this or any previous visit (from the past 48 hour(s)). No results  found.  Post Admission Physician Evaluation: 1. Functional deficits secondary  to right acute on chronic fronto-parietal SDH. 2. Patient is admitted to receive collaborative, interdisciplinary care between the physiatrist, rehab nursing staff, and therapy team. 3. Patient's level of medical complexity and substantial therapy needs in context of that medical necessity cannot be provided at a lesser intensity of care such as a SNF. 4. Patient has experienced substantial functional loss from his/her baseline which was documented above under the "Functional History" and "Functional Status" headings.  Judging by the patient's diagnosis, physical exam, and functional history, the patient has potential for functional progress which will result in measurable gains while on inpatient rehab.  These gains will be of substantial and practical use upon discharge  in facilitating mobility   and self-care at the household level. 5. Physiatrist will provide 24 hour management of medical needs as well as oversight of the therapy plan/treatment and provide guidance as appropriate regarding the interaction of the two. 6. 24 hour rehab nursing will assist with bladder management, bowel management, safety, skin/wound care, disease management, medication administration, pain management and patient education  and help integrate therapy concepts, techniques,education, etc. 7. PT will assess and treat for/with: Lower extremity strength, range of motion, stamina, balance, functional mobility, safety, adaptive techniques and equipment, NMR, visual and cognitive perceptual awareness, education .   Goals are: supervision to minimal assist. 8. OT will assess and treat for/with: ADL's, functional mobility, safety, upper extremity strength, adaptive techniques and equipment, NMR, cognitive and visual perceptual rx, education.   Goals are: supervision to minimal assist. 9. SLP will assess and treat for/with: cognition, communication.   Goals are: supervision to minimal assist. 10. Case Management and Social Worker will assess and treat for psychological issues and discharge planning. 11. Team conference will be held weekly to assess progress toward goals and to determine barriers to discharge. 12. Patient will receive at least 3 hours of therapy per day at least 5 days per week. 13. ELOS: 2 to 3 weeks       14. Prognosis:  excellent   Medical Problem List and Plan: 1. DVT Prophylaxis/Anticoagulation: Mechanical: Sequential compression devices, below knee Bilateral lower extremities 2. Chronic back pain/Pain Management: Uses aleve and "salve" at home.  3. Mood:Continue celexa. LCSW to follow for evaluation.  4. Neuropsych: This patient is intermittently capable of making decisions on his own behalf. 6. Multiple Myeloma: recent diagnosis. Chemo on hold currently. 7. GERD:  Continue protonix. On megace due to poor appetite recently.  8. HTN:  Will monitor with bid checks. Continue norvasc and metoprolol. 9. H/o severe constipation: will set bowel program to prevent recurrence.  10  Hyponatremia: Appears to be chronic. Will recheck in am and follow along. Check cortisol level.    Zachary T. Swartz, MD, FAAPMR [facility]  02/11/2013 

## 2013-02-12 ENCOUNTER — Inpatient Hospital Stay (HOSPITAL_COMMUNITY): Payer: Medicare Other

## 2013-02-12 ENCOUNTER — Inpatient Hospital Stay (HOSPITAL_COMMUNITY): Payer: Medicare Other | Admitting: Occupational Therapy

## 2013-02-12 ENCOUNTER — Encounter (HOSPITAL_COMMUNITY): Payer: Self-pay | Admitting: Radiology

## 2013-02-12 ENCOUNTER — Inpatient Hospital Stay (HOSPITAL_COMMUNITY): Payer: Medicare Other | Admitting: Speech Pathology

## 2013-02-12 DIAGNOSIS — S069X9A Unspecified intracranial injury with loss of consciousness of unspecified duration, initial encounter: Secondary | ICD-10-CM

## 2013-02-12 DIAGNOSIS — W19XXXA Unspecified fall, initial encounter: Secondary | ICD-10-CM

## 2013-02-12 DIAGNOSIS — M79609 Pain in unspecified limb: Secondary | ICD-10-CM

## 2013-02-12 LAB — CBC WITH DIFFERENTIAL/PLATELET
Basophils Relative: 0 % (ref 0–1)
Eosinophils Absolute: 0.1 10*3/uL (ref 0.0–0.7)
Eosinophils Relative: 1 % (ref 0–5)
Hemoglobin: 12.2 g/dL — ABNORMAL LOW (ref 13.0–17.0)
MCH: 32 pg (ref 26.0–34.0)
MCHC: 35.2 g/dL (ref 30.0–36.0)
Monocytes Relative: 7 % (ref 3–12)
Neutrophils Relative %: 66 % (ref 43–77)

## 2013-02-12 LAB — COMPREHENSIVE METABOLIC PANEL
Albumin: 2.6 g/dL — ABNORMAL LOW (ref 3.5–5.2)
BUN: 21 mg/dL (ref 6–23)
Calcium: 8.5 mg/dL (ref 8.4–10.5)
Creatinine, Ser: 0.81 mg/dL (ref 0.50–1.35)
Potassium: 4.1 mEq/L (ref 3.5–5.1)
Total Protein: 7.6 g/dL (ref 6.0–8.3)

## 2013-02-12 MED ORDER — SODIUM CHLORIDE 0.9 % IV SOLN
INTRAVENOUS | Status: DC
  Administered 2013-02-12 – 2013-02-18 (×8): via INTRAVENOUS

## 2013-02-12 NOTE — Progress Notes (Signed)
Social Work Patient ID: Larinda Buttery, male   DOB: [birth year], 79 y.o.   MRN: [id number]  CSW received referral on pt admitted 02-11-13.  Reviewed pt's chart and learned that pt was admitted to [facility] earlier this month and then discharged to [facility] before being readmitted this time.  It was determined that pt needed to have a craniotomy and then CIR transfer was recommended.  CSW attempted to meet with pt/wife twice today without much success.   Met with them the first time and pt was in a lot of discomfort and then needed to use the bathroom.  The second time pt's wife was feeding pt his lunch.  CSW was able to begin assessment and offer support to wife as she was clearly very concerned about pt.  Pt had another scan today due to confusion, left sided weakness, and head pain.  Pt's wife has been at his side for the duration of these hospitalizations and rehab transfers.  She is getting worn out and stated that "this is the hardest thing I've ever gone through."  CSW offered support and assured her that her husband was in good hands on rehab unit and that we have the resources of the hospital should we need them for pt.  She agreed and is relieved to be here.  Wife has advanced directives booklets given to her by the RN.  CSW stated that we need to wait for pt to not be confused to have him complete.  CSW encouraged pt's wife to have hers completed while pt is here, as this would be easy for her to do.  She is seriously considering this and plans to take it home so that she can review it.  Pt has two sons and two daughters-in-law who live locally and are supportive.  One of the dts-in-law is a Engineer, civil (consulting).  CSW will fully complete assessment when it is a better time for pt and wife.  CSW is unaware of pt's wife's plan for pt at d/c, at this time, due to his unstable medical condition.

## 2013-02-12 NOTE — Evaluation (Signed)
Speech Language Pathology Bedside Swallow Evaluation   Patient Details  Name: Shawn Chen MRN: [id number] Date of Birth: 24-Jun-1926  SLP Diagnosis: Dysphagia  Rehab Potential: Excellent ELOS: 3-4 weeks   Today's Date: 02/12/2013 Time: 1030-1050 Time Calculation (min): 20 min  Skilled Therapeutic Intervention: Co-treatment with OT with focus on positioning and arousal for BSE. Pt administered BSE, please see below for details.   Problem List:  Patient Active Problem List   Diagnosis Date Noted  . SDH (subdural hematoma) 02/11/2013  . Anemia, unspecified 02/03/2013  . Dehydration 01/22/2013  . Generalized weakness 01/22/2013  . Unspecified constipation 01/22/2013  . Protein-calorie malnutrition, severe 01/22/2013  . Fecal impaction 01/16/2013  . Abdominal pain, unspecified site 01/16/2013  . Nausea with vomiting 01/16/2013  . Multiple myeloma, without mention of having achieved remission(203.00) 01/05/2013  . GERD (gastroesophageal reflux disease) 07/25/2011  . Diverticulosis of colon (without mention of hemorrhage) 07/25/2011  . Hernia, hiatal 07/02/2011  . Candida infection, esophageal 07/02/2011  . Duodenal diverticulum 07/02/2011  . Personal history of colonic polyps 06/26/2011  . Esophageal reflux 06/26/2011  . RA (rheumatoid arthritis) 06/26/2011   Past Medical History:  Past Medical History  Diagnosis Date  . Diverticulosis of colon (without mention of hemorrhage)   . Esophagitis, unspecified   . 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 Personal history of colonic polyps   . Reflux   . Hernia   . RA (rheumatoid arthritis)   . Myeloma   . Multiple myeloma, without mention of having achieved remission(203.00) 01/05/2013  . Subdural hematoma 6/14    secondary to fall   Past Surgical History:  Past Surgical History  Procedure Laterality Date  . Inguinal hernia repair  03/28/1990    dr price  . Orif hip fracture    . Craniotomy Right 02/08/2013     Procedure: Right CRANIOTOMY for HEMATOMA EVACUATION SUBDURAL;  Surgeon: Cristi Loron, MD;  Location: [facility];  Service: Neurosurgery;  Laterality: Right;    Assessment / Plan / Recommendation Clinical Impression  Pt is an 79 year old male with history of RA, Myeloma, small SDH dx 6/14. Patient with history of N/V and severe fecal impaction as well as recent fall. He was admitted on 02/08/13 with inability to walk and left sided weakness due to large R frontoparietal chronic SDH with significant mass effect and right to left shift. He was evaluated by Dr. Lovell Sheehan and underwent right crani for evacuation on the same day. Post op placed on Keppra for seizure prophylaxis. Dr Myna Hidalgo consulted for input and felt that bleed not due to underlying coagulopathy. Therapies initiated and patient limited by intermittent lethargy as well as problems with sequencing. CIR recommended by therapy team and pt admitted 02/12/13. Pt administered BSE due to what appears to be a change in function. OT present during session with focus on sitting EOB to increase arousal and alertness. Pt demonstrated bilateral lingual weakness and decreased lingual vertical movement, however, suspect pt's decreased cognitive function impacted his ability to perform tasks. Pt consumed trails of thin liquids and puree textures. Pt demonstrated a suspected delay swallow initiation with all trials and right anterior spillage with thin liquids via cup. Pt also demonstrated multiple swallows with thin liquids via straw, however, no overt s/s of aspiration present. Pt also consumed puree textures and demonstrated prolonged AP transit, however, pt without overt s/s of aspiration or oral residue. Pt required cueing for small bites/sips and sustained attention throughout evaluation. Due to  pt's overall lethargy and impaired cognitive function, solid textures were not attempted. Recommend Dys. 1 textures with thin liquids via cup with full  supervision to decreased aspiration risk. Pt's wife present and verbalized understanding.     SLP Assessment  Patient will need skilled Speech Lanaguage Pathology Services during CIR admission    Recommendations  Diet Recommendations: Thin liquid;Dysphagia 1 (Puree) Liquid Administration via: Cup;No straw Medication Administration: Crushed with puree Supervision: Full supervision/cueing for compensatory strategies;Patient able to self feed Compensations: Slow rate;Small sips/bites;Check for pocketing;Check for anterior loss;Follow solids with liquid Postural Changes and/or Swallow Maneuvers: Seated upright 90 degrees;Upright 30-60 min after meal Oral Care Recommendations: Oral care BID Patient destination: Home Follow up Recommendations: Home Health SLP;24 hour supervision/assistance Equipment Recommended: None recommended by SLP    SLP Frequency 5 out of 7 days   SLP Treatment/Interventions Dysphagia/aspiration precaution training;Therapeutic Activities;Patient/family education;Oral motor exercises;Cueing hierarchy;Functional tasks    Pain No/Denies Pain  Prior Functioning Type of Home: House  Lives With: Spouse Available Help at Discharge: Family  Short Term Goals: Week 1: SLP Short Term Goal 1 (Week 1): Pt will consume current diet and uitlize swallowing compensatory strategies to minimize overt s/s of aspiration with Mod verbal and tactile cues.[name] SLP Short Term Goal 2 (Week 1): Pt will demonstrate efficient mastication of Dys. 2 textures without overt s/s of aspiration and Mod verbal cues.   See FIM for current functional status Refer to Care Plan for Long Term Goals  Recommendations for other services: Neuropsych  Discharge Criteria: Patient will be discharged from SLP if patient refuses treatment 3 consecutive times without medical reason, if treatment goals not met, if there is a change in medical status, if patient makes no progress towards goals or if patient is  discharged from hospital.  The above assessment, treatment plan, treatment alternatives and goals were discussed and mutually agreed upon: by family  [name] 02/12/2013, 11:55 AM

## 2013-02-12 NOTE — Progress Notes (Signed)
*  PRELIMINARY RESULTS* Vascular Ultrasound Lower extremity venous duplex has been completed.  Preliminary findings: no evidence of DVT in visualized veins.    Farrel Demark, RDMS, RVT  02/12/2013, 1:29 PM

## 2013-02-12 NOTE — Progress Notes (Signed)
Patient information reviewed and entered into eRehab system by Jaqueline Uber, RN, CRRN, PPS Coordinator.  Information including medical coding and functional independence measure will be reviewed and updated through discharge.     Per nursing patient was given "Data Collection Information Summary for Patients in Inpatient Rehabilitation Facilities with attached "Privacy Act Statement-Health Care Records" upon admission.  

## 2013-02-12 NOTE — Evaluation (Signed)
This note has been reviewed and this clinician agrees with information provided.  

## 2013-02-12 NOTE — Progress Notes (Signed)
Patient noted this am 0800 with no movement in left arm or left leg . Speech dysarthric but able to understand . Patient lethargic but awakens to touch . Pupils level 2 sluggish reaction . P. Love, PA aware of change in status . Patient reports pain in right shoulder . Reports pain in frontal area of head to PA. Orders received . Patient noted after 0930 to move left side but continues to remain very weak and increased tone noted with posturing in left arm . Patient more alert and responsive at this time . Patient unable to complete PT and ST sessions . Awaiting CT scan . Continue with plan of care .   Cleotilde Neer

## 2013-02-12 NOTE — Evaluation (Signed)
Occupational Therapy Assessment and Plan  Patient Details  Name: SRIRAM FEBLES MRN: [id number] Date of Birth: 04/17/1927  OT Diagnosis: abnormal posture, apraxia, cognitive deficits, hemiplegia affecting non-dominant side and muscle weakness (generalized) Rehab Potential: Rehab Potential: Good ELOS: 3-4 weeks   Today's Date: 02/12/2013 Time:  Time Calculation (min): 20 min  Problem List:  Patient Active Problem List   Diagnosis Date Noted  . SDH (subdural hematoma) 02/11/2013  . Anemia, unspecified 02/03/2013  . Dehydration 01/22/2013  . Generalized weakness 01/22/2013  . Unspecified constipation 01/22/2013  . Protein-calorie malnutrition, severe 01/22/2013  . Fecal impaction 01/16/2013  . Abdominal pain, unspecified site 01/16/2013  . Nausea with vomiting 01/16/2013  . Multiple myeloma, without mention of having achieved remission(203.00) 01/05/2013  . GERD (gastroesophageal reflux disease) 07/25/2011  . Diverticulosis of colon (without mention of hemorrhage) 07/25/2011  . Hernia, hiatal 07/02/2011  . Candida infection, esophageal 07/02/2011  . Duodenal diverticulum 07/02/2011  . Personal history of colonic polyps 06/26/2011  . Esophageal reflux 06/26/2011  . RA (rheumatoid arthritis) 06/26/2011    Past Medical History:  Past Medical History  Diagnosis Date  . Diverticulosis of colon (without mention of hemorrhage)   . Esophagitis, unspecified   . 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 Personal history of colonic polyps   . Reflux   . Hernia   . RA (rheumatoid arthritis)   . Myeloma   . Multiple myeloma, without mention of having achieved remission(203.00) 01/05/2013  . Subdural hematoma 6/14    secondary to fall   Past Surgical History:  Past Surgical History  Procedure Laterality Date  . Inguinal hernia repair  03/28/1990    dr price  . Orif hip fracture    . Craniotomy Right 02/08/2013    Procedure: Right CRANIOTOMY for HEMATOMA EVACUATION  SUBDURAL;  Surgeon: Cristi Loron, MD;  Location: [facility];  Service: Neurosurgery;  Laterality: Right;    Assessment & Plan Clinical Impression: Patient is a 79 y.o. year old male with history of RA, Myeloma, small SDH dx 6/14. Patient with history of N/V and severe fecal impaction as well as recent fall. He was admitted on 02/08/13 with inability to walk and left sided weakness due to large R frontoparietal chronic SDH with significant mass effect and right to left shift. He was evaluated by Dr. Lovell Sheehan and underwent right crani for evacuation on the same day. Post op placed on Keppra for seizure prophylaxis. Dr Myna Hidalgo consulted for input and felt that bleed not due to underlying coagulopathy. Therapies initiated and patient limited by intermittent lethargy as well as problems with sequencing.  .  Patient transferred to CIR on 02/11/2013 .    Patient currently requires total with basic self-care skills secondary to muscle weakness, impaired timing and sequencing, abnormal tone, motor apraxia, decreased coordination and decreased motor planning and decreased initiation, decreased attention, decreased awareness, decreased problem solving, decreased safety awareness, decreased memory and delayed processing.  Prior to hospitalization, patient was on rehab unit of skilled nursing facility.   Patient will benefit from skilled intervention to decrease level of assist with basic self-care skills and increase independence with basic self-care skills prior to discharge home with care partner.  Anticipate patient will require 24 hour supervision and follow up home health.  OT - End of Session Endurance Deficit: Yes OT Assessment Rehab Potential: Good OT Patient demonstrates impairments in the following area(s): Balance;Cognition;Endurance;Motor;Safety OT Basic ADL's Functional Problem(s): Eating;Grooming;Bathing;Dressing;Toileting OT Transfers Functional Problem(s): Toilet;Tub/Shower OT Additional  Impairment(s): Fuctional Use of Upper Extremity;Other (comment) (L side inattention) OT Plan 1-2 x a day OT Frequency: 5 out of 7 days OT Duration/Estimated Length of Stay: 3-4 weeks Overall goals min to mod A OT Treatment/Interventions: Balance/vestibular training;Cognitive remediation/compensation;Discharge planning;DME/adaptive equipment instruction;Functional mobility training;Therapeutic Activities;UE/LE Strength taining/ROM;UE/LE Coordination activities;Therapeutic Exercise;Self Care/advanced ADL retraining;Patient/family education;Neuromuscular re-education   Skilled Therapeutic Intervention   Pt very lethargic upon entry to room for evaluation.   RN, PA, medical team notified/aware of pt's status at this time.  Wife reported sudden decline in pt's functioning as of yesterday. Head CT ordered, pt awaiting at time of evaluation.  Pt required total A for bed mobility rolling to L side, demonstrated delay/difficulty following commands throughout session.  Pt required total assist for sitting balance/static and dynamic sitting EOB, immediately experienced LOB in varied directions (forward, to R or L side)  if level of assist decreased, able to wash face with multiple verbal cues. Pt transferred to [facility] after reporting he needed to have a BM, required total assist +2.  Pt unable to have BM at this time.  Demonstrated difficulty with short term/working memory, frequently called therapists by names of family/friends and asked who was present in the room.  Wife arrived approximately half-way through eval.  Impaired sustained attention, easily distracted. Increased tone/associated reactions noted in LUE.  Vital signs WNL during session.   OT Evaluation Precautions/Restrictions  Precautions Precautions: Fall Restrictions Weight Bearing Restrictions: No General   Vital Signs   Pain Pain Assessment Pain Assessment: No/denies pain Home Living/Prior Functioning Home Living Family/patient expects  to be discharged to:: Private residence Living Arrangements: Spouse/significant other Available Help at Discharge: Family Type of Home: House Home Access: Stairs to enter Secretary/administrator of Steps: 4-5 Entrance Stairs-Rails: None Home Layout: Two level;Able to live on main level with bedroom/bathroom  Lives With: Spouse Prior Function Level of Independence: Needs assistance with ADLs;Requires assistive device for independence  Able to Take Stairs?: Yes ADL   Vision/Perception  Vision - History Baseline Vision: No visual deficits Vision - Assessment Vision Assessment: Vision impaired - to be further tested in functional context Additional Comments:  (Slow tracking) Perception Perception:  (Significant lean to R side) Praxis Praxis: Impaired  Cognition Overall Cognitive Status: Impaired/Different from baseline Arousal/Alertness: Lethargic Orientation Level: Oriented to person Attention: Focused;Sustained;Selective;Alternating;Divided Focused Attention: Appears intact Sustained Attention: Impaired Sustained Attention Impairment: Verbal basic;Functional basic Selective Attention: Impaired Selective Attention Impairment: Verbal basic;Functional basic Alternating Attention: Impaired Alternating Attention Impairment: Verbal basic;Functional basic Divided Attention: Impaired Divided Attention Impairment: Verbal basic;Functional basic Memory: Impaired Memory Impairment: Decreased recall of new information;Decreased short term memory Decreased Short Term Memory: Verbal basic Awareness: Impaired Awareness Impairment: Intellectual impairment;Emergent impairment Problem Solving: Impaired Problem Solving Impairment: Functional basic Executive Function: Initiating;Sequencing Sequencing: Impaired Sequencing Impairment: Functional basic Initiating: Impaired Initiating Impairment: Functional basic;Verbal basic Safety/Judgment: Impaired Sensation Sensation Light Touch:  Impaired by gross assessment (L UE/LE appear to be hyposensative) Stereognosis: Not tested Hot/Cold: Not tested Proprioception: Impaired by gross assessment Additional Comments: Significant lean to R side; Pt stating that he felt like he was leaning to the L side Coordination Gross Motor Movements are Fluid and Coordinated: No Fine Motor Movements are Fluid and Coordinated: No Coordination and Movement Description: Impaired by tone, decreased initiation and decreased proprioception Motor  Motor Motor: Hemiplegia Motor - Skilled Clinical Observations: L hemiplegia vs. decrease awareness of L side; increased tone noted in L UE/LE; decreased coordination in R UE/LE Mobility  Bed Mobility Bed Mobility: Rolling Right;Rolling  Left;Supine to Sit;Sit to Supine Rolling Right: 1: +1 Total assist Rolling Right Details: Tactile cues for placement;Verbal cues for precautions/safety;Verbal cues for sequencing;Tactile cues for initiation Rolling Left: 2: Max assist Rolling Left Details: Tactile cues for initiation;Verbal cues for sequencing;Verbal cues for precautions/safety;Tactile cues for placement Rolling Left Details (indicate cue type and reason): Pt req max A x1person to bend L LE into hooklying position 2/2 tone  Supine to Sit: 1: +1 Total assist Supine to Sit Details: Tactile cues for initiation;Verbal cues for sequencing;Verbal cues for precautions/safety;Tactile cues for placement Sitting - Scoot to Edge of Bed: 1: +1 Total assist Sitting - Scoot to Edge of Bed Details: Tactile cues for posture;Tactile cues for sequencing;Verbal cues for sequencing Sitting - Scoot to Edge of Bed Details (indicate cue type and reason): Use of chux Sit to Supine: 1: +2 Total assist Transfers Sit to Stand: With upper extremity assist;Other (comment) (HHA x2persons, 3rd person assist with trunk/bottom) Sit to Stand Details: Verbal cues for sequencing;Tactile cues for sequencing Stand to Sit: Other  (comment);With upper extremity assist (Total assist +3)  Trunk/Postural Assessment  Cervical Assessment Cervical Assessment: Exceptions to [facility] (forward head posture) Thoracic Assessment Thoracic Assessment: Exceptions to [facility] (kyphotic) Postural Control Postural Control: Deficits on evaluation Righting Reactions: delayed/absent, significant pushing to R side  Balance Balance Balance Assessed: Yes Static Sitting Balance Static Sitting - Balance Support: Right upper extremity supported;Feet supported Static Sitting - Level of Assistance: 1: +1 Total assist Static Sitting - Comment/# of Minutes: Significant lean to R side Dynamic Sitting Balance Dynamic Sitting - Comments: static and dynamic sitting balance are poor- unable to attain/sustain without constant assist.   Static Standing Balance Static Standing - Balance Support: Bilateral upper extremity supported Static Standing - Level of Assistance: Other (comment) (Total assist +3) Static Standing - Comment/# of Minutes: Kyphotic posture, req cues to improve posture, unable to achieve full upright posture.  Extremity/Trunk Assessment RUE Assessment RUE Assessment: Within Functional Limits RUE AROM (degrees) Overall AROM Right Upper Extremity: Within functional limits for tasks performed RUE Strength RUE Overall Strength Comments: Appears WFL for tasks performed RUE Tone RUE Tone: Within Functional Limits LUE AROM (degrees) LUE Overall AROM Comments: Overall ROM limited by increased tone in LUE. LUE Strength LUE Overall Strength Comments: Unable to complete full AROM against gravity. overall strength 3- LUE Tone LUE Tone: Hypertonic, 2 on Modified Ashworth Scale  FIM:  FIM - Eating Eating Activity: 5: Needs verbal cues/supervision;4: Helper checks for pocketed food FIM - Toileting Toileting steps completed by patient: Adjust clothing prior to toileting;Performs perineal hygiene;Adjust clothing after toileting Toileting: 1: Two  helpers FIM - Diplomatic Services operational officer Devices: Psychiatrist Transfers: 1-To toilet/BSC: Total A (helper does all/Pt. < 25%);1-From toilet/BSC: Total A (helper does all/Pt. < 25%);1-Two helpers   Refer to Care Plan for Long Term Goals  Recommendations for other services: Neuropsychology  Discharge Criteria: Patient will be discharged from OT if patient refuses treatment 3 consecutive times without medical reason, if treatment goals not met, if there is a change in medical status, if patient makes no progress towards goals or if patient is discharged from hospital.  The above assessment, treatment plan, treatment alternatives and goals were discussed and mutually agreed upon: by patient and by family  Kandis Ban 02/12/2013, 2:34 PM

## 2013-02-12 NOTE — Evaluation (Signed)
Physical Therapy Assessment and Plan  Patient Details  Name: Shawn Chen MRN: [id number] Date of Birth: [birth year]  PT Diagnosis: Abnormal posture, Abnormality of gait, Cognitive deficits, Coordination disorder, Difficulty walking, Hemiparesis?, Hypertonia, Impaired cognition, Impaired sensation and Muscle weakness Rehab Potential: Good ELOS: 3-4 weeks  Today's Date: 02/12/2013 Time: 4098-1191 Time Calculation (min): 45 min  Problem List:  Patient Active Problem List   Diagnosis Date Noted  . SDH (subdural hematoma) 02/11/2013  . Anemia, unspecified 02/03/2013  . Dehydration 01/22/2013  . Generalized weakness 01/22/2013  . Unspecified constipation 01/22/2013  . Protein-calorie malnutrition, severe 01/22/2013  . Fecal impaction 01/16/2013  . Abdominal pain, unspecified site 01/16/2013  . Nausea with vomiting 01/16/2013  . Multiple myeloma, without mention of having achieved remission(203.00) 01/05/2013  . GERD (gastroesophageal reflux disease) 07/25/2011  . Diverticulosis of colon (without mention of hemorrhage) 07/25/2011  . Hernia, hiatal 07/02/2011  . Candida infection, esophageal 07/02/2011  . Duodenal diverticulum 07/02/2011  . Personal history of colonic polyps 06/26/2011  . Esophageal reflux 06/26/2011  . RA (rheumatoid arthritis) 06/26/2011    Past Medical History:  Past Medical History  Diagnosis Date  . Diverticulosis of colon (without mention of hemorrhage)   . Esophagitis, unspecified   . 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 Personal history of colonic polyps   . Reflux   . Hernia   . RA (rheumatoid arthritis)   . Myeloma   . Multiple myeloma, without mention of having achieved remission(203.00) 01/05/2013  . Subdural hematoma 6/14    secondary to fall   Past Surgical History:  Past Surgical History  Procedure Laterality Date  . Inguinal hernia repair  03/28/1990    dr price  . Orif hip fracture    . Craniotomy Right 02/08/2013     Procedure: Right CRANIOTOMY for HEMATOMA EVACUATION SUBDURAL;  Surgeon: Cristi Loron, MD;  Location: [facility];  Service: Neurosurgery;  Laterality: Right;    Assessment & Plan Clinical Impression: Mr Shawn Chen is an 79 year old male with history of RA, Myeloma, small SDH dx 6/14. Patient with history of N/V and severe fecal impaction as well as recent fall. He was admitted on 02/08/13 with inability to walk and left sided weakness due to large R frontoparietal chronic SDH with significant mass effect and right to left shift. He was evaluated by Dr. Lovell Sheehan and underwent right crani for evacuation on the same day. Post op placed on Keppra for seizure prophylaxis. Dr Myna Hidalgo consulted for input and felt that bleed not due to underlying coagulopathy. Therapies initiated and patient limited by intermittent lethargy as well as problems with sequencing. CIR recommended by therapy team. Patient transferred to CIR on 02/11/2013 .   Patient currently requires total with mobility secondary to muscle weakness, impaired timing and sequencing, abnormal tone, decreased coordination and decreased motor planning, decreased midline orientation and decreased attention to left and decreased initiation, decreased attention, decreased awareness, decreased problem solving, decreased safety awareness and decreased memory.  Prior to hospitalization, patient was modified independent  with mobility and lived with Spouse in a House home.  Home access is 4-5Stairs to enter.  Patient will benefit from skilled PT intervention to maximize safe functional mobility, minimize fall risk and decrease caregiver burden for planned discharge home with 24 hour assist.  Anticipate patient will benefit from follow up [facility] at discharge.  PT - End of Session Endurance Deficit: Yes PT Assessment Rehab Potential: Good PT Patient demonstrates impairments in the  following area(s):  Balance;Behavior;Endurance;Motor;Perception;Pain;Safety;Sensory PT Transfers Functional Problem(s): Bed Mobility;Bed to Chair;Car;Furniture;Floor PT Locomotion Functional Problem(s): Ambulation;Wheelchair Mobility;Stairs PT Plan PT Intensity: Minimum of 1-2 x/day ,45 to 90 minutes PT Frequency: 5 out of 7 days PT Duration Estimated Length of Stay: 2-3 weeks PT Treatment/Interventions: Ambulation/gait training;Balance/vestibular training;Cognitive remediation/compensation;Disease management/prevention;Discharge planning;Community reintegration;DME/adaptive equipment instruction;Neuromuscular re-education;Psychosocial support;Stair training;UE/LE Strength taining/ROM;Wheelchair propulsion/positioning;UE/LE Coordination activities;Therapeutic Activities;Pain management;Patient/family education;Therapeutic Exercise;Visual/perceptual remediation/compensation;Functional mobility training PT Transfers Anticipated Outcome(s): Min to Mod A x1person PT Locomotion Anticipated Outcome(s): Min to Mod A x1person PT Recommendation Follow Up Recommendations: Home health PT Patient destination: Home Equipment Details: DME needs TBD  Skilled Therapeutic Intervention Treatment Session 1: 1:1. PT evaluation completed, see objective information below. Pt semi-reclined in bed upon entry, lethargic and having difficulty opening eyes fully. Pt demonstrated w/ dysarthric and quiet speech throughout tx session, but pt able to relay having a headache and need to have a BM at the start of the tx session. RN and nurse tech also in room to address pt's pain and to assist w/ mobility/request for toileting. Pt req total A x1person to t/f sup>sit EOB, however, req total A x2persons to maintain sitting EOB 2/2 decreased orientation to midline, R lean and significant tone in L UE/LE. Pt verbalized feeling that he was leaning to the L. BP recorded in sitting position in R arm as 136/117 as well as 138/78 in L arm. Pt requesting to lie  back down 2/2 not feeling well and req total assist X 2persons for t/f sit>sup. Pt req total A x1person for rolling R, max A x1person for rolling L w/ use of bed rail and total A for brief change, second person available for assist if needed. Pt thought to be incontinent, but not. Pt unsafe to perform OOB mobility at this time due to lethargy and BP concerns. RN and PA aware of concerns regarding BP and apparent change in physical mobility status vs. Last acute care PT session yesterday.    Therapist briefly checked into pt's room during OT evaluation. Pt again sitting EOB, however, demonstrating increased alertness and tolerance to static sitting EOB. Pt able to perform t/f sit<>stand w/ max HHA x2 persons and max A x3rd person for management of trunk posture. Pt presenting w/ very flexed head and trunk posture with limited ability to improve upright posture w/ cues.   Treatment Session 2: Pt not seen this tx session due to fatigue, therapy schedule changed to QD and pending CT results.   PT Evaluation Precautions/Restrictions Precautions Precautions: Fall Restrictions Weight Bearing Restrictions: No General Chart Reviewed: Yes Amount of Missed PT Time (min): 15 Minutes Missed Time Reason: Patient ill (comment);Patient fatigue (BP/MAP contraindication and fatigue) Family/Caregiver Present: No Vital Signs Pain   Home Living/Prior Functioning Home Living Available Help at Discharge: Family Type of Home: House Home Access: Stairs to enter [employer] of Steps: 4-5 Entrance Stairs-Rails: None Home Layout: Two level;Able to live on main level with bedroom/bathroom  Lives With: Spouse Prior Function Level of Independence: Needs assistance with ADLs;Requires assistive device for independence  Able to Take Stairs?: Yes Vision/Perception  Vision - History Baseline Vision: No visual deficits Vision - Assessment Vision Assessment: Vision impaired - to be further tested in  functional context Additional Comments:  (Slow tracking) Perception Perception:  (Significant lean to R side) Praxis Praxis: Impaired  Cognition Overall Cognitive Status: Impaired/Different from baseline Arousal/Alertness: Lethargic Orientation Level: Oriented X4 Attention: Focused;Sustained;Selective;Alternating;Divided Focused Attention: Appears intact Sustained Attention: Impaired Sustained Attention Impairment: Verbal basic;Functional basic  Selective Attention: Impaired Selective Attention Impairment: Verbal basic;Functional basic Memory: Impaired Memory Impairment: Decreased recall of new information;Decreased short term memory Decreased Short Term Memory: Verbal basic Awareness: Impaired Awareness Impairment: Intellectual impairment;Emergent impairment Problem Solving: Impaired Problem Solving Impairment: Functional basic Executive Function: Initiating;Sequencing Sequencing: Impaired Sequencing Impairment: Functional basic Initiating: Impaired Initiating Impairment: Functional basic;Verbal basic Safety/Judgment: Impaired Sensation Sensation Light Touch: Impaired by gross assessment (L UE/LE appear to be hyposensative) Stereognosis: Not tested Hot/Cold: Not tested Proprioception: Impaired by gross assessment Additional Comments: Significant lean to R side; Pt stating that he felt like he was leaning to the L side Coordination Gross Motor Movements are Fluid and Coordinated: No Fine Motor Movements are Fluid and Coordinated: No Coordination and Movement Description: Impaired by tone, decreased initiation and decreased proprioception Motor  Motor Motor: Hemiplegia Motor - Skilled Clinical Observations: L hemiplegia vs. decrease awareness of L side; increased tone noted in L UE/LE; decreased coordination in R UE/LE  Mobility Bed Mobility Bed Mobility: Rolling Right;Rolling Left;Supine to Sit;Sit to Supine Rolling Right: 1: +1 Total assist Rolling Right Details:  Tactile cues for placement;Verbal cues for precautions/safety;Verbal cues for sequencing;Tactile cues for initiation Rolling Left: 2: Max assist Rolling Left Details: Tactile cues for initiation;Verbal cues for sequencing;Verbal cues for precautions/safety;Tactile cues for placement Rolling Left Details (indicate cue type and reason): Pt req max A x1person to bend L LE into hooklying position 2/2 tone  Supine to Sit: 1: +1 Total assist Supine to Sit Details: Tactile cues for initiation;Verbal cues for sequencing;Verbal cues for precautions/safety;Tactile cues for placement Sitting - Scoot to Edge of Bed: 1: +1 Total assist Sitting - Scoot to Edge of Bed Details: Tactile cues for posture;Tactile cues for sequencing;Verbal cues for sequencing Sitting - Scoot to Edge of Bed Details (indicate cue type and reason): Use of chux Sit to Supine: 1: +2 Total assist Transfers Sit to Stand: With upper extremity assist;Other (comment) (HHA x2persons, 3rd person assist with trunk/bottom) Sit to Stand Details: Verbal cues for sequencing;Tactile cues for sequencing Stand to Sit: Other (comment);With upper extremity assist (Total assist +3) Locomotion  Ambulation Ambulation: No (Unsafe/unable to attempt at this time 2/2 lethargy and BP ) Wheelchair Mobility Wheelchair Mobility: No (Unsafe/unable to attempt at this time 2/2 lethargy and BP )  Trunk/Postural Assessment  Cervical Assessment Cervical Assessment: Exceptions to [facility] (forward head posture) Thoracic Assessment Thoracic Assessment: Exceptions to [facility] (kyphotic) Postural Control Postural Control: Deficits on evaluation Righting Reactions: delayed/absent, significant pushing to R side  Balance Balance Balance Assessed: Yes Static Sitting Balance Static Sitting - Balance Support: Right upper extremity supported;Feet supported Static Sitting - Level of Assistance: 1: +1 Total assist Static Sitting - Comment/# of Minutes: Significant lean to R  side Dynamic Sitting Balance Dynamic Sitting - Comments: static and dynamic sitting balance are poor- unable to attain/sustain without constant assist.   Static Standing Balance Static Standing - Balance Support: Bilateral upper extremity supported Static Standing - Level of Assistance: Other (comment) (Total assist +3) Static Standing - Comment/# of Minutes: Kyphotic posture, req cues to improve posture, unable to achieve full upright posture.  Extremity Assessment  RLE Assessment RLE Assessment: Within Functional Limits (Fair-good functional strength) LLE Assessment LLE Assessment: Exceptions to [facility] (Unable to determine gross strength 2/2 increased tone in L LE)  FIM:  FIM - Bed/Chair Transfer Bed/Chair Transfer Assistive Devices: Bed rails;HOB elevated Bed/Chair Transfer: 1: Two helpers FIM - Locomotion: Wheelchair Locomotion: Wheelchair: 0: Activity did not occur (Unsafe/unable) FIM - Locomotion: Ambulation Locomotion: Ambulation:  0: Activity did not occur (Unsafe/unable) FIM - Locomotion: Stairs Locomotion: Stairs: 0: Activity did not occur (Unsafe/unable)   Refer to Care Plan for Long Term Goals  Recommendations for other services: None  Discharge Criteria: Patient will be discharged from PT if patient refuses treatment 3 consecutive times without medical reason, if treatment goals not met, if there is a change in medical status, if patient makes no progress towards goals or if patient is discharged from hospital.  The above assessment, treatment plan, treatment alternatives and goals were discussed and mutually agreed upon: by patient  Denzil Hughes 02/12/2013, 1:00 PM

## 2013-02-12 NOTE — Plan of Care (Signed)
Problem: RH BOWEL ELIMINATION Goal: RH STG MANAGE BOWEL W/EQUIPMENT W/ASSISTANCE STG Manage Bowel With Equipment With Min Assistance  Outcome: Progressing Requires total assist

## 2013-02-12 NOTE — Progress Notes (Signed)
Subjective/Complaints: No specific complaints. Just waking up. Denies pain. A 12 point review of systems has been performed and if not noted above is otherwise negative.   Objective: Vital Signs: Blood pressure 138/73, pulse 84, temperature 98.9 F (37.2 C), temperature source Oral, resp. rate 17, height 5\' 10"  (1.778 m), weight 62.732 kg (138 lb 4.8 oz), SpO2 96.00%. No results found.  Recent Labs  02/12/13 0559  WBC 7.9  HGB 12.2*  HCT 34.7*  PLT 269    Recent Labs  02/12/13 0559  NA 130*  K 4.1  CL 99  GLUCOSE 95  BUN 21  CREATININE 0.81  CALCIUM 8.5   CBG (last 3)  No results found for this basename: GLUCAP,  in the last 72 hours  Wt Readings from Last 3 Encounters:  02/11/13 62.732 kg (138 lb 4.8 oz)  02/08/13 65.5 kg (144 lb 6.4 oz)  02/08/13 65.5 kg (144 lb 6.4 oz)    Physical Exam:  Constitutional: He appears well-developed and well-nourished. No distress.  HENT:  Head: Normocephalic.  Right crani incision with staples in place and dry crusted blood.  Eyes: Conjunctivae and EOM are normal. Pupils are equal, round, and reactive to light. Right eye exhibits no discharge. Left eye exhibits no discharge. No scleral icterus.  Neck: Normal range of motion. Neck supple. No JVD present. No tracheal deviation present. No thyromegaly present.  Cardiovascular: Normal rate and regular rhythm. Exam reveals no gallop and no friction rub.  No murmur heard.  Pulmonary/Chest: Effort normal and breath sounds normal. No stridor. No respiratory distress. He has no wheezes. He has no rales. He exhibits no tenderness.  Abdominal: Soft. Bowel sounds are normal. He exhibits no distension and no mass. There is no tenderness. There is no rebound and no guarding.  Musculoskeletal: He exhibits no edema and no tenderness.  Lymphadenopathy:  He has no cervical adenopathy.  Neurological: He is alert. He exhibits normal muscle tone.  Less alert.  He displays a left central 7 and  slurring of his speech, but his speech is less intelligible. His sitting balance was fair to good. He difficulty with initiating and coordinating movement of his LUE more than LLE. Some apraxia was definitely seen. LUE 2+ to 3- at deltoid, tricep, biceps, 2 to 2+with HI, wrist. Senses pain in left arm and leg. LLE is 3 to 3+ with HF, KE, ADF and APF. Right upper extremity and lower grossly 4/5.    Skin: Skin is warm and dry. He is not diaphoretic.    Assessment/Plan: 1. Functional deficits secondary to right fronto-parietal SDH which require 3+ hours per day of interdisciplinary therapy in a comprehensive inpatient rehab setting. Physiatrist is providing close team supervision and 24 hour management of active medical problems listed below. Physiatrist and rehab team continue to assess barriers to discharge/monitor patient progress toward functional and medical goals. FIM:                   Comprehension Comprehension Mode: Auditory Comprehension: 5-Understands basic 90% of the time/requires cueing < 10% of the time  Expression Expression Mode: Verbal Expression: 5-Expresses basic 90% of the time/requires cueing < 10% of the time.  Social Interaction Social Interaction: 5-Interacts appropriately 90% of the time - Needs monitoring or encouragement for participation or interaction.  Problem Solving Problem Solving: 5-Solves basic 90% of the time/requires cueing < 10% of the time  Memory Memory: 4-Recognizes or recalls 75 - 89% of the time/requires cueing 10 - 24% of  the time  Medical Problem List and Plan:  1. DVT Prophylaxis/Anticoagulation: Mechanical: Sequential compression devices, below knee Bilateral lower extremities  2. Chronic back pain/Pain Management: Uses aleve and "salve" at home.  3. Mood:Continue celexa. LCSW to follow for evaluation.  4. Neuropsych: This patient is intermittently capable of making decisions on his own behalf.  6. Multiple Myeloma: recent  diagnosis. Chemo on hold currently.  7. GERD: Continue protonix. On megace due to poor appetite recently.  8. HTN: Will monitor with bid checks. Continue norvasc and metoprolol.  9. H/o severe constipation: will set bowel program to prevent recurrence.  10 Hyponatremia: continue to monitor sodium level (130 today). Cortisol level pending  LOS (Days) 1 A FACE TO FACE EVALUATION WAS PERFORMED  Lizzet Hendley T 02/12/2013 8:00 AM

## 2013-02-13 ENCOUNTER — Encounter (HOSPITAL_COMMUNITY): Payer: Medicare Other | Admitting: Occupational Therapy

## 2013-02-13 ENCOUNTER — Inpatient Hospital Stay (HOSPITAL_COMMUNITY): Payer: Medicare Other

## 2013-02-13 ENCOUNTER — Inpatient Hospital Stay (HOSPITAL_COMMUNITY): Payer: Medicare Other | Admitting: Speech Pathology

## 2013-02-13 LAB — BASIC METABOLIC PANEL
BUN: 18 mg/dL (ref 6–23)
Chloride: 100 mEq/L (ref 96–112)
Glucose, Bld: 102 mg/dL — ABNORMAL HIGH (ref 70–99)
Potassium: 4 mEq/L (ref 3.5–5.1)
Sodium: 130 mEq/L — ABNORMAL LOW (ref 135–145)

## 2013-02-13 LAB — CBC
HCT: 34.1 % — ABNORMAL LOW (ref 39.0–52.0)
Hemoglobin: 12.1 g/dL — ABNORMAL LOW (ref 13.0–17.0)
RBC: 3.76 MIL/uL — ABNORMAL LOW (ref 4.22–5.81)
WBC: 6.8 10*3/uL (ref 4.0–10.5)

## 2013-02-13 LAB — CORTISOL-AM, BLOOD: Cortisol - AM: 2.3 ug/dL — ABNORMAL LOW (ref 4.3–22.4)

## 2013-02-13 MED ORDER — CIPROFLOXACIN HCL 250 MG PO TABS
250.0000 mg | ORAL_TABLET | Freq: Two times a day (BID) | ORAL | Status: DC
  Administered 2013-02-13 (×2): 250 mg via ORAL
  Filled 2013-02-13 (×5): qty 1

## 2013-02-13 MED ORDER — WHITE PETROLATUM GEL
Status: AC
  Administered 2013-02-13: 0.2
  Filled 2013-02-13: qty 5

## 2013-02-13 NOTE — Progress Notes (Signed)
Daughter in law was updated on current issues as well as need to monitor progress over next few day as well as conference to decide on LOS/discharge goals. . She reported patient with gait disorder since hip fracture years ago and three falls (where he struck his  head) since June of this year. She's concerned about wife's ability to make appropriate decisions as well as ability to care for him at home. Would like family conference prior to discussing discharge plans with patient and wife. Will make LCSW aware and have her follow up.

## 2013-02-13 NOTE — Progress Notes (Signed)
This note has been reviewed and this clinician agrees with information provided.  

## 2013-02-13 NOTE — Progress Notes (Signed)
Physical Therapy Session Note  Patient Details  Name: Shawn Chen MRN: [id number] Date of Birth: [birth year]  Today's Date: 02/13/2013 Time: 1300-1405 Time Calculation (min): 65 min  Short Term Goals: Week 1:  PT Short Term Goal 1 (Week 1): Pt to perform bed mobility w/ max A x1person PT Short Term Goal 2 (Week 1): Pt to maintain midline static sitting w/ max A x1person PT Short Term Goal 3 (Week 1): Pt to perform transfer bed<>w/c w/ max A x1person PT Short Term Goal 4 (Week 1): Pt to perform static standing w/ mod A x2persons PT Short Term Goal 5 (Week 1): Pt to propel w/c x15' w/ mod A x1person  Skilled Therapeutic Interventions/Progress Updates:    1:1. Pt supine in bed asleep upon entry, easy to wake. Focus majority of tx session on static and dynamic sitting balance both EOB and on tx mat. Pt req total assist x2persons for all bed mobility sup<>sit as well as max A x2 person to achieve midline sitting and ability to maintain midline sitting w/ mod-max A x1person. Focus in sitting on posture, lateral and AP weight shifts, dynamic reaching w/ B UE +/- midline w/ mirror in front for visual feedback. Pt req frequent rest breaks throughout tx session 2/2 fatigue. Pt's switched to a reclining w/c this tx session due to limited postural control and endurance. Pt req max to total A x2persons for all squat pivot transfers bed<>w/c<>t/x mat. Pt very fatigued at end of tx session, falling asleep and decreased clarity of speech. Pt supine in bed asleep at end of tx session, bed alarm on and all needs in reach.   Therapy Documentation Precautions:  Precautions Precautions: Fall Restrictions Weight Bearing Restrictions: No   Vital Signs: Therapy Vitals BP: 102/64 mmHg Patient Position, if appropriate: Sitting Pain: Pain Assessment Pain Assessment: No/denies pain  See FIM for current functional status  Therapy/Group: Individual Therapy  Denzil Hughes 02/13/2013, 4:19 PM

## 2013-02-13 NOTE — Evaluation (Signed)
Speech Language Pathology Cognitive-linguistic Evaluation   Patient Details  Name: Shawn Chen MRN: [id number] Date of Birth: April 15, 1927  SLP Diagnosis: Dysphagia;Cognitive Impairments;Dysarthria  Rehab Potential: Excellent ELOS: 3-4 weeks   Today's Date: 02/13/2013 Time: 1015-1100 Time Calculation (min): 45 min  Skilled Therapeutic Intervention: Administered cognitive-linguistic evaluation. Please see below for details.   Problem List:  Patient Active Problem List   Diagnosis Date Noted  . SDH (subdural hematoma) 02/11/2013  . Anemia, unspecified 02/03/2013  . Dehydration 01/22/2013  . Generalized weakness 01/22/2013  . Unspecified constipation 01/22/2013  . Protein-calorie malnutrition, severe 01/22/2013  . Fecal impaction 01/16/2013  . Abdominal pain, unspecified site 01/16/2013  . Nausea with vomiting 01/16/2013  . Multiple myeloma, without mention of having achieved remission(203.00) 01/05/2013  . GERD (gastroesophageal reflux disease) 07/25/2011  . Diverticulosis of colon (without mention of hemorrhage) 07/25/2011  . Hernia, hiatal 07/02/2011  . Candida infection, esophageal 07/02/2011  . Duodenal diverticulum 07/02/2011  . Personal history of colonic polyps 06/26/2011  . Esophageal reflux 06/26/2011  . RA (rheumatoid arthritis) 06/26/2011   Past Medical History:  Past Medical History  Diagnosis Date  . Diverticulosis of colon (without mention of hemorrhage)   . Esophagitis, unspecified   . 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 Personal history of colonic polyps   . Reflux   . Hernia   . RA (rheumatoid arthritis)   . Myeloma   . Multiple myeloma, without mention of having achieved remission(203.00) 01/05/2013  . Subdural hematoma 6/14    secondary to fall   Past Surgical History:  Past Surgical History  Procedure Laterality Date  . Inguinal hernia repair  03/28/1990    dr price  . Orif hip fracture    . Craniotomy Right 02/08/2013     Procedure: Right CRANIOTOMY for HEMATOMA EVACUATION SUBDURAL;  Surgeon: Cristi Loron, MD;  Location: [facility];  Service: Neurosurgery;  Laterality: Right;    Assessment / Plan / Recommendation Clinical Impression  Pt administered cognitive-linguistic evaluation and presents with severe cognitive impairments impacting pt's initiation, sustained attention, functional problem solving, intellectual awareness, safety awareness and working memory. Pt also demonstrates mild-moderate dysarthria due to decreased oral-motor strength and weakness which impacts pt's overall speech intelligibility. Intelligibility is also impacted by fatigue.  Pt would benefit from skilled SLP intervention to maximize cognitive function and functional communication to maximize pt's overall functional independence.     SLP Assessment  Patient will need skilled Speech Lanaguage Pathology Services during CIR admission    Recommendations  Diet Recommendations: Thin liquid;Dysphagia 1 (Puree) Liquid Administration via: Cup;No straw Medication Administration: Crushed with puree Supervision: Full supervision/cueing for compensatory strategies;Patient able to self feed Compensations: Slow rate;Small sips/bites;Check for pocketing;Check for anterior loss;Follow solids with liquid Postural Changes and/or Swallow Maneuvers: Seated upright 90 degrees;Upright 30-60 min after meal Oral Care Recommendations: Oral care BID Patient destination: Home Follow up Recommendations: Home Health SLP;24 hour supervision/assistance Equipment Recommended: None recommended by SLP    SLP Frequency 5 out of 7 days   SLP Treatment/Interventions Cueing hierarchy;Cognitive remediation/compensation;Environmental controls;Internal/external aids;Speech/Language facilitation;Therapeutic Exercise;Oral motor exercises;Therapeutic Activities;Patient/family education;Functional tasks;Dysphagia/aspiration precaution training    Pain No/Denies  Pain  Short Term Goals: Week 1: SLP Short Term Goal 1 (Week 1): Pt will consume current diet and uitlize swallowing compensatory strategies to minimize overt s/s of aspiration with Mod verbal and tactile cues.[name] SLP Short Term Goal 2 (Week 1): Pt will demonstrate efficient mastication of Dys. 2 textures without overt s/s of aspiration  and Mod verbal cues.  SLP Short Term Goal 3 (Week 1): Pt will sustain attention to functional tasks for 5 minutes with Min A verbal cues for redirection SLP Short Term Goal 4 (Week 1): Pt will orient to time, place and situation with Min A verbal and visual cues. SLP Short Term Goal 5 (Week 1): Pt will utilize call bell to express wants/needs with Max A verbal and question cues.  SLP Short Term Goal 6 (Week 1): Pt will identify 1 physical and 1 cognitive deficit with Mod A semantic and question cues.   See FIM for current functional status Refer to Care Plan for Long Term Goals  Recommendations for other services: Neuropsych  Discharge Criteria: Patient will be discharged from SLP if patient refuses treatment 3 consecutive times without medical reason, if treatment goals not met, if there is a change in medical status, if patient makes no progress towards goals or if patient is discharged from hospital.  The above assessment, treatment plan, treatment alternatives and goals were discussed and mutually agreed upon: by patient and by family  Shawn Chen 02/13/2013, 3:08 PM

## 2013-02-13 NOTE — Progress Notes (Signed)
Subjective/Complaints: More alert. No distress A 12 point review of systems has been performed and if not noted above is otherwise negative.   Objective: Vital Signs: Blood pressure 132/71, pulse 78, temperature 97.7 F (36.5 C), temperature source Oral, resp. rate 17, height 5\' 10"  (1.778 m), weight 62.732 kg (138 lb 4.8 oz), SpO2 97.00%. Ct Head Wo Contrast  02/12/2013   *RADIOLOGY REPORT*  Clinical Data: Increasing lethargy. status post craniotomy for subdural hematoma, prior stroke, history of multiple myeloma  CT HEAD WITHOUT CONTRAST  Technique:  Contiguous axial images were obtained from the base of the skull through the vertex without contrast.  Comparison: 02/09/2013  Findings: Status post right frontal craniotomy.  Overlying skin staples.  Interval removal of prior subdural drain.  Underlying mixed density subdural hemorrhage measuring up to 1.8 cm in thickness, previously 1.7 cm.  Associated mild pneumocephalus.  Associated mass effect with 5 mm leftward midline shift, unchanged. Basal cisterns are patent.  No evidence of parenchymal or interventricular hemorrhage.  Subcortical white matter and periventricular small vessel ischemic changes.  Global cortical atrophy with stable mild ventricular prominence.  The visualized paranasal sinuses are essentially clear. The mastoid air cells are unopacified.  No evidence of calvarial fracture.  IMPRESSION: Status post right frontal craniotomy.  Interval removal of prior subdural drain.  Underlying mixed density right subdural hemorrhage measuring up to 1.8 cm in thickness, previously 1.7 cm.  Associated mass effect with 5 mm leftward midline shift, unchanged.   Original Report Authenticated By: Charline Bills, M.D.    Recent Labs  02/12/13 0559 02/13/13 0450  WBC 7.9 6.8  HGB 12.2* 12.1*  HCT 34.7* 34.1*  PLT 269 284    Recent Labs  02/12/13 0559 02/13/13 0450  NA 130* 130*  K 4.1 4.0  CL 99 100  GLUCOSE 95 102*  BUN 21 18   CREATININE 0.81 0.71  CALCIUM 8.5 8.2*   CBG (last 3)  No results found for this basename: GLUCAP,  in the last 72 hours  Wt Readings from Last 3 Encounters:  02/11/13 62.732 kg (138 lb 4.8 oz)  02/08/13 65.5 kg (144 lb 6.4 oz)  02/08/13 65.5 kg (144 lb 6.4 oz)    Physical Exam:  Constitutional: He appears well-developed and well-nourished. No distress.  HENT:  Head: Normocephalic.  Right crani incision with staples in place.  Eyes: Conjunctivae and EOM are normal. Pupils are equal, round, and reactive to light. Right eye exhibits no discharge. Left eye exhibits no discharge. No scleral icterus.  Neck: Normal range of motion. Neck supple. No JVD present. No tracheal deviation present. No thyromegaly present.  Cardiovascular: Normal rate and regular rhythm. Exam reveals no gallop and no friction rub.  No murmur heard.  Pulmonary/Chest: Effort normal and breath sounds normal. No stridor. No respiratory distress. He has no wheezes. He has no rales. He exhibits no tenderness.  Abdominal: Soft. Bowel sounds are normal. He exhibits no distension and no mass. There is no tenderness. There is no rebound and no guarding.  Musculoskeletal: He exhibits no edema and no tenderness.  Lymphadenopathy:  He has no cervical adenopathy.  Neurological: He is alert. He exhibits normal muscle tone.  more alert.  He displays a left central 7 and slurring of his speech, but his speech is more intelligible today. His sitting balance was fair to good. He difficulty with initiating and coordinating movement of his LUE more than LLE. Some apraxia was definitely seen. LUE 2+ to 3- at deltoid, tricep,  biceps, 2 to 2+with HI, wrist. Senses pain in left arm and leg. LLE is 3 to 3+ with HF, KE, ADF and APF. Right upper extremity and lower grossly 4/5.    Skin: Skin is warm and dry. He is not diaphoretic.    Assessment/Plan: 1. Functional deficits secondary to right fronto-parietal SDH which require 3+ hours per day  of interdisciplinary therapy in a comprehensive inpatient rehab setting. Physiatrist is providing close team supervision and 24 hour management of active medical problems listed below. Physiatrist and rehab team continue to assess barriers to discharge/monitor patient progress toward functional and medical goals. FIM:       FIM - Toileting Toileting steps completed by patient: Adjust clothing prior to toileting;Performs perineal hygiene;Adjust clothing after toileting Toileting: 1: Two helpers  FIM - Diplomatic Services operational officer Devices: Psychiatrist Transfers: 1-To toilet/BSC: Total A (helper does all/Pt. < 25%);1-From toilet/BSC: Total A (helper does all/Pt. < 25%);1-Two helpers  FIM - Banker Devices: Bed rails;HOB elevated Bed/Chair Transfer: 1: Two helpers  FIM - Locomotion: Wheelchair Locomotion: Wheelchair: 0: Activity did not occur (Unsafe/unable) FIM - Locomotion: Ambulation Locomotion: Ambulation: 0: Activity did not occur (Unsafe/unable)  Comprehension Comprehension Mode: Auditory Comprehension: 3-Understands basic 50 - 74% of the time/requires cueing 25 - 50%  of the time  Expression Expression Mode: Verbal Expression: 3-Expresses basic 50 - 74% of the time/requires cueing 25 - 50% of the time. Needs to repeat parts of sentences.  Social Interaction Social Interaction: 3-Interacts appropriately 50 - 74% of the time - May be physically or verbally inappropriate.  Problem Solving Problem Solving: 2-Solves basic 25 - 49% of the time - needs direction more than half the time to initiate, plan or complete simple activities  Memory Memory: 2-Recognizes or recalls 25 - 49% of the time/requires cueing 51 - 75% of the time  Medical Problem List and Plan:  1. DVT Prophylaxis/Anticoagulation: Mechanical: Sequential compression devices, below knee Bilateral lower extremities  2. Chronic back pain/Pain  Management: Uses aleve and "salve" at home.  3. Mood:Continue celexa. LCSW to follow for evaluation.  4. Neuropsych: This patient is intermittently capable of making decisions on his own behalf.  6. Multiple Myeloma: recent diagnosis. Chemo on hold currently.  7. GERD: Continue protonix. On megace due to poor appetite recently.  8. HTN: Will monitor with bid checks. Continue norvasc and metoprolol.  9. H/o severe constipation: will set bowel program to prevent recurrence.  10 Hyponatremia: continue to monitor sodium level (130 today). Cortisol level is low.--clinical significance? 11. Altered mental status: looks better today  -age,TBI factors  -rx UTI- pseudomonas-----cipro started empirically   LOS (Days) 2 A FACE TO FACE EVALUATION WAS PERFORMED  Adaria Hole T 02/13/2013 8:15 AM

## 2013-02-13 NOTE — IPOC Note (Signed)
Overall Plan of Care [facility]) Patient Details Name: Shawn Chen MRN: [id number] DOB: 1926/07/26  Admitting Diagnosis: SDH WITH CRANI  Hospital Problems: Principal Problem:   SDH (subdural hematoma) Active Problems:   Esophageal reflux   RA (rheumatoid arthritis)   Multiple myeloma, without mention of having achieved remission(203.00)   Co-morbidities:  See above   Functional Problem List: Nursing    PT Balance;Behavior;Endurance;Motor;Perception;Pain;Safety;Sensory  OT Balance;Cognition;Endurance;Motor;Safety  SLP Cognition  TR         Basic ADL's: OT Eating;Grooming;Bathing;Dressing;Toileting     Advanced  ADL's: OT       Transfers: PT Bed Mobility;Bed to Chair;Car;Furniture;Floor  OT Toilet;Tub/Shower     Locomotion: PT Ambulation;Wheelchair Mobility;Stairs     Additional Impairments: OT Fuctional Use of Upper Extremity;Other (comment) (L side inattention)  SLP Communication;Social Cognition expression;comprehension Social Interaction;Problem Solving;Memory;Attention;Awareness  TR      Anticipated Outcomes Item Anticipated Outcome  Self Feeding supervision  Swallowing  Supervsion with least restrictive diet   Basic self-care  min A to mod A  Toileting  mod A   Bathroom Transfers mod A  Bowel/Bladder     Transfers  Min A x1person  Locomotion  Min A x1person  Communication  Supervision  Cognition  Supervision   Pain     Safety/Judgment      Therapy Plan: PT Intensity: Minimum of 1-2 x/day ,45 to 90 minutes PT Frequency: 5 out of 7 days PT Duration Estimated Length of Stay: 3-4 weeks OT Intensity: Minimum of 1-2 x/day, 45 to 90 minutes OT Frequency: 5 out of 7 days OT Duration/Estimated Length of Stay: 3-4 weeks SLP Intensity: Minumum of 1-2 x/day, 30 to 90 minutes SLP Frequency: 5 out of 7 days SLP Duration/Estimated Length of Stay: 3-4 weeks       Team Interventions: Nursing Interventions    PT interventions Ambulation/gait  training;Balance/vestibular training;Cognitive remediation/compensation;Disease management/prevention;Discharge planning;Community reintegration;DME/adaptive equipment instruction;Neuromuscular re-education;Psychosocial support;Stair training;UE/LE Strength taining/ROM;Wheelchair propulsion/positioning;UE/LE Coordination activities;Therapeutic Activities;Pain management;Patient/family education;Therapeutic Exercise;Visual/perceptual remediation/compensation;Functional mobility training  OT Interventions Balance/vestibular training;Cognitive remediation/compensation;Discharge planning;DME/adaptive equipment instruction;Functional mobility training;Therapeutic Activities;UE/LE Strength taining/ROM;UE/LE Coordination activities;Therapeutic Exercise;Self Care/advanced ADL retraining;Patient/family education;Neuromuscular re-education  SLP Interventions Cueing hierarchy;Cognitive remediation/compensation;Environmental controls;Internal/external aids;Speech/Language facilitation;Therapeutic Exercise;Oral motor exercises;Therapeutic Activities;Patient/family education;Functional tasks;Dysphagia/aspiration precaution training  TR Interventions    SW/CM Interventions      Team Discharge Planning: Destination: PT-Home ,OT- Home , SLP-Home Projected Follow-up: PT-Home health PT, OT-  Home health OT, SLP-Home Health SLP;24 hour supervision/assistance Projected Equipment Needs: PT- , OT-  , SLP-None recommended by SLP Patient/family involved in discharge planning: PT- Patient,  OT-Patient;Family member/caregiver, SLP-Patient unable/family or caregive not available;Family member/caregiver  MD ELOS: 3-4 weeks Medical Rehab Prognosis:  Excellent Assessment: The patient has been admitted for CIR therapies. The team will be addressing, functional mobility, strength, stamina, balance, safety, adaptive techniques/equipment, self-care, bowel and bladder mgt, patient and caregiver education, NMR, cognitive perceptual rx,  speech, cognition. Goals have been set at   min assist with basic mobility, supervision with cognition/communication, and mod assist with self-care.    Ranelle Oyster, MD, FAAPMR      See Team Conference Notes for weekly updates to the plan of care

## 2013-02-13 NOTE — Progress Notes (Signed)
Occupational Therapy Session Note  Patient Details  Name: Shawn Chen MRN: [id number] Date of Birth: 01/30/1927  Today's Date: 02/13/2013 Time:  0830- 0930    Short Term Goals: Week 1:  OT Short Term Goal 1 (Week 1): Pt will maintain dynamic sitting balance with mod A in preparation with ADL tasks. OT Short Term Goal 2 (Week 1): Pt will bathe with max assist in supported sitting. OT Short Term Goal 3 (Week 1): Pt will demonstrate orientation x4 with min A.  OT Short Term Goal 4 (Week 1): Pt will demonstrate sustained attention for one grooming task with min verbal cues.   Skilled Therapeutic Interventions/Progress Updates:    Pt lethargic upon entry to room,  responsive to verbal stimuli.  Daughter present for ADL retraining session focused on static/dynamic sitting balance, attention to task, functional transfers.  Pt engaged in bed mobility with verbal/tactile cues and max assist. Transferred from EOB to w/c total assist +2, difficulty following simple commands to participate in transfers.  Pt engaged in bathing tasks at sink, able to follow one-step directions inconsistently and with verbal cueing.  Pt demonstrated inattention to LUE, encouraged to utilize LUE for functional tasks such as reaching, holding items, and bathing with HOH assist.  Pt frequently attempted to use RUE for all tasks after being given verbal and tactile cues to utilize the LUE.  Toilet transfers completed x2 after pt stated he needed to have a BM.  Pt aware daughter present in room and addressed her by name.  Max verbal cues/tactile cues given to sustain static sitting balance/midline posture in w/c.      Therapy Documentation Precautions:  Precautions Precautions: Fall Restrictions Weight Bearing Restrictions: No  See FIM for current functional status  Therapy/Group: Individual Therapy  Kandis Ban 02/13/2013, 10:59 AM

## 2013-02-14 ENCOUNTER — Inpatient Hospital Stay (HOSPITAL_COMMUNITY): Payer: Medicare Other | Admitting: Physical Therapy

## 2013-02-14 ENCOUNTER — Inpatient Hospital Stay (HOSPITAL_COMMUNITY): Payer: Medicare Other

## 2013-02-14 ENCOUNTER — Encounter (HOSPITAL_COMMUNITY): Payer: Medicare Other | Admitting: Occupational Therapy

## 2013-02-14 ENCOUNTER — Inpatient Hospital Stay (HOSPITAL_COMMUNITY): Payer: Medicare Other | Admitting: Speech Pathology

## 2013-02-14 DIAGNOSIS — D649 Anemia, unspecified: Secondary | ICD-10-CM

## 2013-02-14 DIAGNOSIS — G9340 Encephalopathy, unspecified: Secondary | ICD-10-CM

## 2013-02-14 DIAGNOSIS — I6200 Nontraumatic subdural hemorrhage, unspecified: Secondary | ICD-10-CM

## 2013-02-14 DIAGNOSIS — R569 Unspecified convulsions: Secondary | ICD-10-CM

## 2013-02-14 DIAGNOSIS — K219 Gastro-esophageal reflux disease without esophagitis: Secondary | ICD-10-CM

## 2013-02-14 DIAGNOSIS — R5381 Other malaise: Secondary | ICD-10-CM

## 2013-02-14 LAB — URINE CULTURE: Colony Count: >=100000

## 2013-02-14 LAB — BLOOD GAS, ARTERIAL
Acid-base deficit: 6.4 mmol/L — ABNORMAL HIGH (ref 0.0–2.0)
Drawn by: 257081
O2 Content: 3 L/min
Patient temperature: 98.6
TCO2: 17.7 mmol/L (ref 0–100)
pCO2 arterial: 25 mmHg — ABNORMAL LOW (ref 35.0–45.0)

## 2013-02-14 LAB — CBC
HCT: 32.4 % — ABNORMAL LOW (ref 39.0–52.0)
Hemoglobin: 11.5 g/dL — ABNORMAL LOW (ref 13.0–17.0)
MCH: 32.3 pg (ref 26.0–34.0)
MCHC: 35.5 g/dL (ref 30.0–36.0)
RBC: 3.56 MIL/uL — ABNORMAL LOW (ref 4.22–5.81)

## 2013-02-14 LAB — COMPREHENSIVE METABOLIC PANEL
Alkaline Phosphatase: 70 U/L (ref 39–117)
BUN: 18 mg/dL (ref 6–23)
CO2: 19 mEq/L (ref 19–32)
Calcium: 7.9 mg/dL — ABNORMAL LOW (ref 8.4–10.5)
GFR calc Af Amer: >90 mL/min (ref >90)
GFR calc non Af Amer: 80 mL/min — ABNORMAL LOW (ref >90)
Glucose, Bld: 101 mg/dL — ABNORMAL HIGH (ref 70–99)
Potassium: 3.7 mEq/L (ref 3.5–5.1)
Total Protein: 7.4 g/dL (ref 6.0–8.3)

## 2013-02-14 MED ORDER — METOPROLOL TARTRATE 1 MG/ML IV SOLN
5.0000 mg | Freq: Three times a day (TID) | INTRAVENOUS | Status: DC
  Administered 2013-02-14 – 2013-02-19 (×13): 5 mg via INTRAVENOUS
  Filled 2013-02-14 (×17): qty 5

## 2013-02-14 MED ORDER — CIPROFLOXACIN IN D5W 200 MG/100ML IV SOLN
200.0000 mg | Freq: Two times a day (BID) | INTRAVENOUS | Status: DC
  Administered 2013-02-14 – 2013-02-19 (×10): 200 mg via INTRAVENOUS
  Filled 2013-02-14 (×11): qty 100

## 2013-02-14 MED ORDER — SODIUM CHLORIDE 0.9 % IV SOLN
500.0000 mg | Freq: Two times a day (BID) | INTRAVENOUS | Status: DC
  Administered 2013-02-14 – 2013-02-15 (×2): 500 mg via INTRAVENOUS
  Filled 2013-02-14 (×4): qty 5

## 2013-02-14 NOTE — Progress Notes (Signed)
Patient ID: Shawn Chen, male   DOB: 20-Nov-1926, 79 y.o.   MRN: [id number] Subjective/Complaints: 8/30. Patient is much more somnolent compared to yesterday when apparently he was up and ambulatory. Unable to tolerate breakfast do to decrease level of consciousness. O2 saturation this morning as low as 84% and now on supplemental oxygen with O2 sat 93% at 3 L per minute nasal cannula Patient is status post a craniotomy for subdural hematoma. Medical problems include a history of multiple myeloma. Serum sodium yesterday 1:30 which is unchanged  Objective: Vital Signs: Blood pressure 154/90, pulse 102, temperature 98.5 F (36.9 C), temperature source Oral, resp. rate 20, height 5\' 10"  (1.778 m), weight 62.732 kg (138 lb 4.8 oz), SpO2 96.00%. Ct Head Wo Contrast  02/12/2013   *RADIOLOGY REPORT*  Clinical Data: Increasing lethargy. status post craniotomy for subdural hematoma, prior stroke, history of multiple myeloma  CT HEAD WITHOUT CONTRAST  Technique:  Contiguous axial images were obtained from the base of the skull through the vertex without contrast.  Comparison: 02/09/2013  Findings: Status post right frontal craniotomy.  Overlying skin staples.  Interval removal of prior subdural drain.  Underlying mixed density subdural hemorrhage measuring up to 1.8 cm in thickness, previously 1.7 cm.  Associated mild pneumocephalus.  Associated mass effect with 5 mm leftward midline shift, unchanged. Basal cisterns are patent.  No evidence of parenchymal or interventricular hemorrhage.  Subcortical white matter and periventricular small vessel ischemic changes.  Global cortical atrophy with stable mild ventricular prominence.  The visualized paranasal sinuses are essentially clear. The mastoid air cells are unopacified.  No evidence of calvarial fracture.  IMPRESSION: Status post right frontal craniotomy.  Interval removal of prior subdural drain.  Underlying mixed density right subdural hemorrhage measuring up  to 1.8 cm in thickness, previously 1.7 cm.  Associated mass effect with 5 mm leftward midline shift, unchanged.   Original Report Authenticated By: Charline Bills, M.D.    Recent Labs  02/12/13 0559 02/13/13 0450  WBC 7.9 6.8  HGB 12.2* 12.1*  HCT 34.7* 34.1*  PLT 269 284    Recent Labs  02/12/13 0559 02/13/13 0450  NA 130* 130*  K 4.1 4.0  CL 99 100  GLUCOSE 95 102*  BUN 21 18  CREATININE 0.81 0.71  CALCIUM 8.5 8.2*   CBG (last 3)  No results found for this basename: GLUCAP,  in the last 72 hours  Wt Readings from Last 3 Encounters:  02/11/13 62.732 kg (138 lb 4.8 oz)  02/08/13 65.5 kg (144 lb 6.4 oz)  02/08/13 65.5 kg (144 lb 6.4 oz)    Physical Exam:  Constitutional: He appears well-developed and well-nourished. No distress.  HENT:  Head: Normocephalic.  Right crani incision with staples in place.  Eyes: Conjunctivae and EOM are normal. Pupils are equal, round, and reactive to light. Right eye exhibits no discharge. Left eye exhibits no discharge. No scleral icterus.  Neck: Normal range of motion. Neck supple. No JVD present. No tracheal deviation present. No thyromegaly present.  Cardiovascular: Normal rate and regular rhythm. Exam reveals no gallop and no friction rub.  No murmur heard.  Pulmonary/Chest: Effort normal and breath sounds normal. No stridor. No respiratory distress. He has no wheezes. He has no rales. He exhibits no tenderness.  Abdominal: Soft. Bowel sounds are normal. He exhibits no distension and no mass. There is no tenderness. There is no rebound and no guarding.  Musculoskeletal: He exhibits no edema and no tenderness.  Lymphadenopathy:  He  has no cervical adenopathy.  Neurological: He is somnolent but arousable; L sided weakness  Extremities- SCDs in place  Assessment/Plan: 1. Functional deficits secondary to right fronto-parietal SDH which require 3+ hours per day of interdisciplinary therapy in a comprehensive inpatient rehab  setting. 2.  Clinical deterioration with increased somnolence today and respiratory insufficiency.  We'll consult the hospitalist service for consideration of transfer to step down.  Will need emergent lab repeat head CT and sepsis workup 3. Hyponatremia      LOS (Days) 3 A FACE TO FACE EVALUATION WAS PERFORMED  Rogelia Boga 02/14/2013 8:40 AM

## 2013-02-14 NOTE — Progress Notes (Signed)
Occupational Therapy Note  Patient Details  Name: Shawn Chen MRN: [id number] Date of Birth: 11/29/1926 Today's Date: 02/14/2013  Time In:  0800 Time out: 0830.  Attempted ADL retraining session with patient this am.  Patient EXTREMELY lethargic.  Able to arouse only for approximately 2-3 seconds and unable to follow simple one step commands. Patient currently +2 total assist for all activities however acute therapy notes indicate patient was ambulating with mod assist prior to admission to CIR.  Patient with very shallow breathing and long periods of delay between breaths.  O2 sats on room air WITH STIMULATION were only 85.  RN and MD notified.  Patient placed on verbal hold therapy order until further notice.    Norton Pastel 02/14/2013, 8:40 AM

## 2013-02-14 NOTE — Consult Note (Signed)
PULMONARY  / CRITICAL CARE MEDICINE  Name: CYLE KENYON MRN: [id number] DOB: 10/18/1926    ADMISSION DATE:  02/11/2013 CONSULTATION DATE:  02/14/2013  REFERRING MD :  Madison Hickman PRIMARY SERVICE:  Rehab  CHIEF COMPLAINT:  Altered mental status  BRIEF PATIENT DESCRIPTION: 79 yo in rehabilitation after SDH and craniotomy. Had been doing well until the morning of 8/30 when he found to altered and difficult to arouse on PT rounds.  SIGNIFICANT EVENTS / STUDIES:   LINES / TUBES:  CULTURES:  ANTIBIOTICS:  The patient is encephalopathic and unable to provide history, which was obtained for available medical records.  HISTORY OF PRESENT ILLNESS:  79 yo in rehabilitation after SDH and craniotomy. Had been doing well until the morning of 8/30 when he found to altered and difficult to arouse on PT rounds.  PAST MEDICAL HISTORY :  Past Medical History  Diagnosis Date  . Diverticulosis of colon (without mention of hemorrhage)   . Esophagitis, unspecified   . 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 Personal history of colonic polyps   . Reflux   . Hernia   . RA (rheumatoid arthritis)   . Myeloma   . Multiple myeloma, without mention of having achieved remission(203.00) 01/05/2013  . Subdural hematoma 6/14    secondary to fall   Past Surgical History  Procedure Laterality Date  . Inguinal hernia repair  03/28/1990    dr price  . Orif hip fracture    . Craniotomy Right 02/08/2013    Procedure: Right CRANIOTOMY for HEMATOMA EVACUATION SUBDURAL;  Surgeon: Cristi Loron, MD;  Location: [facility];  Service: Neurosurgery;  Laterality: Right;   Prior to Admission medications   Medication Sig Start Date End Date Taking? Authorizing Provider  alendronate (FOSAMAX) 70 MG tablet Take 70 mg by mouth every 7 (seven) days. Take with a full glass of water on an empty stomach.   Yes Historical Provider, MD  bisacodyl (DULCOLAX) 10 MG suppository Place 10 mg rectally daily as  needed for constipation.   Yes Historical Provider, MD  citalopram (CELEXA) 10 MG tablet Take 10 mg by mouth daily.  11/12/12  Yes Historical Provider, MD  folic acid (FOLVITE) 1 MG tablet Take 2 tablets (2 mg total) by mouth daily. 01/26/13  Yes W Buren Kos, MD  loperamide (IMODIUM) 2 MG capsule Take 1 capsule (2 mg total) by mouth as needed for diarrhea or loose stools. 01/26/13  Yes W Buren Kos, MD  magnesium hydroxide (MILK OF MAGNESIA) 400 MG/5ML suspension Take 30 mLs by mouth daily as needed for constipation.   Yes Historical Provider, MD  megestrol (MEGACE) 400 MG/10ML suspension Take 10 mLs (400 mg total) by mouth daily. 01/26/13  Yes W Buren Kos, MD  metoprolol succinate (TOPROL-XL) 50 MG 24 hr tablet Take 25 mg by mouth every morning. Take with or immediately following a meal.   Yes Historical Provider, MD  Multiple Vitamin (MULTIVITAMIN WITH MINERALS) TABS Take 1 tablet by mouth daily.   Yes Historical Provider, MD  pantoprazole (PROTONIX) 20 MG tablet Take 20 mg by mouth daily.   Yes Historical Provider, MD  polyethylene glycol (MIRALAX / GLYCOLAX) packet Take 17 g by mouth daily. 01/18/13  Yes [name], MD  simvastatin (ZOCOR) 40 MG tablet Take 40 mg by mouth daily.     Yes Historical Provider, MD  Sodium Phosphates (RA SALINE ENEMA) 19-7 GM/118ML ENEM Place 1 Bottle rectally daily as needed (for constipation).  Yes Historical Provider, MD  tamsulosin (FLOMAX) 0.4 MG CAPS Take 0.4 mg by mouth daily.   Yes Historical Provider, MD  ALPRAZolam Prudy Feeler) 0.25 MG tablet Take one tablet by mouth every 8 hours as needed 02/09/13   Claudie Revering, NP  feeding supplement (ENSURE COMPLETE) LIQD Take 237 mLs by mouth 2 (two) times daily between meals. 01/26/13   [name], MD  ondansetron (ZOFRAN-ODT) 8 MG disintegrating tablet Take 1 tablet (8 mg total) by mouth every 12 (twelve) hours as needed for nausea. 01/16/13   [name]. [name], MD  protein supplement (RESOURCE BENEPROTEIN) POWD  Take 1 scoop by mouth 2 (two) times daily.    Historical Provider, MD   No Known Allergies  FAMILY HISTORY:  Family History  Problem Relation Age of Onset  . Colon cancer Neg Hx    SOCIAL HISTORY:  reports that he quit smoking about 22 years ago. He has never used smokeless tobacco. He reports that he does not drink alcohol or use illicit drugs.  REVIEW OF SYSTEMS:  Unable to provide  SUBJECTIVE:   VITAL SIGNS: Temp:  [97.5 F (36.4 C)-98.5 F (36.9 C)] 97.6 F (36.4 C) (08/30 0815) Pulse Rate:  [77-102] 83 (08/30 0815) Resp:  [20] 20 (08/30 0648) BP: (117-154)/(74-90) 117/74 mmHg (08/30 0815) SpO2:  [96 %-100 %] 100 % (08/30 0815)  PHYSICAL EXAMINATION: Gen: Awake, confused, non focal, amnesia as to events of this morning  HENT: Craniotomy wound is intact Pulm: CTAB, protects airway, cough / gag present  Card: RRR Abd: non-tender  Ext: ne edema  CT head: no change c/w prior film.   Recent Labs Lab 02/12/13 0559 02/13/13 0450 02/14/13 1020  NA 130* 130* 132*  K 4.1 4.0 3.7  CL 99 100 102  CO2 18* 16* 19  BUN 21 18 18   CREATININE 0.81 0.71 0.79  GLUCOSE 95 102* 101*    Recent Labs Lab 02/12/13 0559 02/13/13 0450 02/14/13 1020  HGB 12.2* 12.1* 11.5*  HCT 34.7* 34.1* 32.4*  WBC 7.9 6.8 6.0  PLT 269 284 273    Recent Labs Lab 02/14/13 1138  PHART 7.445  PCO2ART 25.0*  PO2ART 129.0*  HCO3 16.9*  TCO2 17.7  O2SAT 99.1   Ct Head Wo Contrast  02/14/2013   *RADIOLOGY REPORT*  Clinical Data: Subdural hematoma.  Somnolence.  CT HEAD WITHOUT CONTRAST  Technique:  Contiguous axial images were obtained from the base of the skull through the vertex without contrast.  Comparison: CT 02/12/2013  Findings: Right craniotomy for subdural drainage.  Mixed density subdural hematoma is unchanged.  The high density component measures up to 18 mm, unchanged.  There is also significant intermediate to low density component to the hematoma.  Mass effect on the right  cerebral hemisphere is similar.  5 mm midline shift to the left is unchanged.  There is generalized atrophy.  Chronic microvascular ischemic change in the white matter.  No acute ischemic infarct.  IMPRESSION: Moderate to large mixed density subdural hematoma on the right is unchanged.  5 mm midline shift unchanged.   Original Report Authenticated By: [name], M.D.   Ct Head Wo Contrast  02/12/2013   *RADIOLOGY REPORT*  Clinical Data: Increasing lethargy. status post craniotomy for subdural hematoma, prior stroke, history of multiple myeloma  CT HEAD WITHOUT CONTRAST  Technique:  Contiguous axial images were obtained from the base of the skull through the vertex without contrast.  Comparison: 02/09/2013  Findings: Status post right frontal  craniotomy.  Overlying skin staples.  Interval removal of prior subdural drain.  Underlying mixed density subdural hemorrhage measuring up to 1.8 cm in thickness, previously 1.7 cm.  Associated mild pneumocephalus.  Associated mass effect with 5 mm leftward midline shift, unchanged. Basal cisterns are patent.  No evidence of parenchymal or interventricular hemorrhage.  Subcortical white matter and periventricular small vessel ischemic changes.  Global cortical atrophy with stable mild ventricular prominence.  The visualized paranasal sinuses are essentially clear. The mastoid air cells are unopacified.  No evidence of calvarial fracture.  IMPRESSION: Status post right frontal craniotomy.  Interval removal of prior subdural drain.  Underlying mixed density right subdural hemorrhage measuring up to 1.8 cm in thickness, previously 1.7 cm.  Associated mass effect with 5 mm leftward midline shift, unchanged.   Original Report Authenticated By: Charline Bills, M.D.   ASSESSMENT / PLAN:  Subdural hematoma Status post craniotomy Acute encephalopathy, now resolving, suspect post-ictal status Protects airway and without evidence of hypercarbia No evidence of extension of  SHD  -->  Appears stable for SDU admission -->  Seizure precautions -->  Would load with Keppra -->  Consider Neurology consultation / EEG -->  Check Mg level -->  Assure no episodes of hypoglycemia -->  Would d/c Xanax as associated with risk of seizures -->  Will sign off for now, please reconsult as needed  Lonia Farber, MD Pulmonary and Critical Care Medicine [facility] Pager: [phone]  02/14/2013, 3:19 PM

## 2013-02-14 NOTE — Consult Note (Deleted)
Reason for consult:  Sudden MS change Consulting MD: Kwitowski  HPI 20 yom in rehab s/p crani for right SDH. Had been doing well up until 8/30 am. who was found to have sudden MS change, w/ difficulty to arouse on PT rounds.  When c/w Called to eval. Pt now more awake, follow commands. Still a little disoriented. Think he is likely pos-ictal.   Past Medical History  Diagnosis Date  . Diverticulosis of colon (without mention of hemorrhage)   . Esophagitis, unspecified   . 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 Personal history of colonic polyps   . Reflux   . Hernia   . RA (rheumatoid arthritis)   . Myeloma   . Multiple myeloma, without mention of having achieved remission(203.00) 01/05/2013  . Subdural hematoma 6/14    secondary to fall   History   Social History  . Marital Status: Married    Spouse Name: N/A    Number of Children: 2  . Years of Education: N/A   Occupational History  . retired    Social History Main Topics  . Smoking status: Former Smoker    Quit date: 06/18/1990  . Smokeless tobacco: Never Used  . Alcohol Use: No  . Drug Use: No  . Sexual Activity: No   Other Topics Concern  . Not on file   Social History Narrative  . No narrative on file   Family History  Problem Relation Age of Onset  . Colon cancer Neg Hx    Scheduled Meds: . amLODipine  5 mg Oral Daily  . atorvastatin  20 mg Oral q1800  . ciprofloxacin  250 mg Oral BID  . citalopram  10 mg Oral Daily  . feeding supplement  237 mL Oral BID BM  . feeding supplement  30 mL Oral Daily  . folic acid  2 mg Oral Daily  . levETIRAcetam  500 mg Oral BID  . megestrol  400 mg Oral Daily  . metoprolol succinate  25 mg Oral Daily  . multivitamin with minerals  1 tablet Oral Daily  . pantoprazole  20 mg Oral Daily  . polyethylene glycol  17 g Oral BID  . valACYclovir  1,000 mg Oral Daily   Continuous Infusions: . sodium chloride 75 mL/hr (02/14/13 0700)   PRN  Meds:.acetaminophen, ALPRAZolam, alum & mag hydroxide-simeth, bisacodyl, guaiFENesin-dextromethorphan, HYDROcodone-acetaminophen, ondansetron (ZOFRAN) IV, ondansetron, sodium phosphate, traZODone  Temp:  [97.5 F (36.4 C)-98.5 F (36.9 C)] 97.6 F (36.4 C) (08/30 0815) Pulse Rate:  [77-102] 83 (08/30 0815) Resp:  [20] 20 (08/30 0648) BP: (102-154)/(64-90) 117/74 mmHg (08/30 0815) SpO2:  [96 %-100 %] 100 % (08/30 0815) 4 liters  Gen: awake, confused but verbal, did not remember this am. Moves extremities HENT: right crani incision well approx Pulm clear Card: RRR Abd: non-tender Ext: ne edema CT head: no change c/w prior film.  CBC Recent Labs     02/12/13  0559  02/13/13  0450  02/14/13  1020  WBC  7.9  6.8  6.0  HGB  12.2*  12.1*  11.5*  HCT  34.7*  34.1*  32.4*  PLT  269  284  273    Coag's No results found for this basename: APTT, INR,  in the last 72 hours  BMET Recent Labs     02/12/13  0559  02/13/13  0450  02/14/13  1020  NA  130*  130*  132*  K  4.1  4.0  3.7  CL  99  100  102  CO2  18*  16*  19  BUN  21  18  18   CREATININE  0.81  0.71  0.79  GLUCOSE  95  102*  101*    Electrolytes Recent Labs     02/12/13  0559  02/13/13  0450  02/14/13  1020  CALCIUM  8.5  8.2*  7.9*    Sepsis Markers No results found for this basename: LACTICACIDVEN, PROCALCITON, O2SATVEN,  in the last 72 hours  ABG Recent Labs     02/14/13  1138  PHART  7.445  PCO2ART  25.0*  PO2ART  129.0*    Liver Enzymes Recent Labs     02/12/13  0559  02/14/13  1020  AST  15  17  ALT  13  16  ALKPHOS  74  70  BILITOT  0.7  0.8  ALBUMIN  2.6*  2.5*    Cardiac Enzymes No results found for this basename: TROPONINI, PROBNP,  in the last 72 hours  Glucose No results found for this basename: GLUCAP,  in the last 72 hours  Imaging Ct Head Wo Contrast  02/14/2013   *RADIOLOGY REPORT*  Clinical Data: Subdural hematoma.  Somnolence.  CT HEAD WITHOUT CONTRAST  Technique:   Contiguous axial images were obtained from the base of the skull through the vertex without contrast.  Comparison: CT 02/12/2013  Findings: Right craniotomy for subdural drainage.  Mixed density subdural hematoma is unchanged.  The high density component measures up to 18 mm, unchanged.  There is also significant intermediate to low density component to the hematoma.  Mass effect on the right cerebral hemisphere is similar.  5 mm midline shift to the left is unchanged.  There is generalized atrophy.  Chronic microvascular ischemic change in the white matter.  No acute ischemic infarct.  IMPRESSION: Moderate to large mixed density subdural hematoma on the right is unchanged.  5 mm midline shift unchanged.   Original Report Authenticated By: [name], M.D.   Ct Head Wo Contrast  02/12/2013   *RADIOLOGY REPORT*  Clinical Data: Increasing lethargy. status post craniotomy for subdural hematoma, prior stroke, history of multiple myeloma  CT HEAD WITHOUT CONTRAST  Technique:  Contiguous axial images were obtained from the base of the skull through the vertex without contrast.  Comparison: 02/09/2013  Findings: Status post right frontal craniotomy.  Overlying skin staples.  Interval removal of prior subdural drain.  Underlying mixed density subdural hemorrhage measuring up to 1.8 cm in thickness, previously 1.7 cm.  Associated mild pneumocephalus.  Associated mass effect with 5 mm leftward midline shift, unchanged. Basal cisterns are patent.  No evidence of parenchymal or interventricular hemorrhage.  Subcortical white matter and periventricular small vessel ischemic changes.  Global cortical atrophy with stable mild ventricular prominence.  The visualized paranasal sinuses are essentially clear. The mastoid air cells are unopacified.  No evidence of calvarial fracture.  IMPRESSION: Status post right frontal craniotomy.  Interval removal of prior subdural drain.  Underlying mixed density right subdural hemorrhage  measuring up to 1.8 cm in thickness, previously 1.7 cm.  Associated mass effect with 5 mm leftward midline shift, unchanged.   Original Report Authenticated By: Charline Bills, M.D.    I/P Sudden mental status change/ acute encephalopathy.  Most likely this represents post-ictal state s/p seizure after crani for SDH.  He has improved since this mornings events. He does not need ICU level care.  Recommendation Continue anticonvulsants Wean FIO2 Consider  neuro eval and titration of AEDs Will defer to primary service re: if he stays in rehab or goes to in-patient setting.

## 2013-02-14 NOTE — Progress Notes (Signed)
Physical Therapy Note  Patient Details  Name: RHYAN RADLER MRN: [id number] Date of Birth: [birth year] Today's Date: 02/14/2013  0900 Pt missed 60 minute PT session - medically unable to participate   Zowie Lundahl,JIM 02/14/2013, 9:03 AM

## 2013-02-14 NOTE — Progress Notes (Signed)
Pt's family in attendance.  Wife and daughter adamant re:  DNR status being re-instated.  State it is what the patient has always desired.  Status improved.  Able to move to command.  Returned from CT. VSS and remains on 3LPM of O2 via n/c.  Will discuss with MD

## 2013-02-14 NOTE — Progress Notes (Signed)
Speech Language Pathology Daily Session Note  Patient Details  Name: Shawn Chen MRN: [id number] Date of Birth: [birth year]  Today's Date: 02/14/2013 Time: 8295-6213 Time Calculation (min): 10 min  Short Term Goals: Week 1: SLP Short Term Goal 1 (Week 1): Pt will consume current diet and uitlize swallowing compensatory strategies to minimize overt s/s of aspiration with Mod verbal and tactile cues.[name] SLP Short Term Goal 2 (Week 1): Pt will demonstrate efficient mastication of Dys. 2 textures without overt s/s of aspiration and Mod verbal cues.  SLP Short Term Goal 3 (Week 1): Pt will sustain attention to functional tasks for 5 minutes with Min A verbal cues for redirection SLP Short Term Goal 4 (Week 1): Pt will orient to time, place and situation with Min A verbal and visual cues. SLP Short Term Goal 5 (Week 1): Pt will utilize call bell to express wants/needs with Max A verbal and question cues.  SLP Short Term Goal 6 (Week 1): Pt will identify 1 physical and 1 cognitive deficit with Mod A semantic and question cues.   Skilled Therapeutic Interventions: No written orders to hold therapy, but verbal order given this am per chart review.  SLP attempted to determine if patient was alert enough for p.o.  Patient sitting in bed and opened eyes to name; however, responded to questions with one word answers in ~50% of opportunities.  Patient reported not feeling right, but wanted something to drink.  O2 being delivered via nasal cannula at 3L and SpO2 readings 95-99%.  SLP proceeded with hand over hand for 1 cup sip and patient demonstrated a suspected delay in swallow initiation with multiple swallows; while there were no overt s/s of aspiration this clinician recommends to hold p.o. until patient is awake and alert.  RN notified.       FIM:  FIM - Eating Eating Activity: 2: Hand over hand assist  Pain Pain Assessment Pain Assessment: No/denies pain  Therapy/Group: Individual  Therapy  Charlane Ferretti., CCC-SLP [phone]  Shawn Chen 02/14/2013, 1:50 PM

## 2013-02-14 NOTE — Progress Notes (Signed)
Called to room by therapist, who stated that pt. Was increasingly lethargic with R/A sat of 84%.  On assessment, pt found to be extremely lethargic, awakening only to deep stimuli.  Verbal in single syllables only.  O2 via Deputy applied at 4 liters.  Sat increased to 100%.  Decreased to 3 liters.  Pattern of respirations changed with approx. 10-15 sec. Of apneic periods followed by stair-casing pattern.  Hospitalist to see pt. Due to decline in status.  Wife informed of status at this time.  Pt. Potentially to be discharged to step-down due to acute status change.

## 2013-02-15 ENCOUNTER — Inpatient Hospital Stay (HOSPITAL_COMMUNITY): Payer: Medicare Other | Admitting: *Deleted

## 2013-02-15 ENCOUNTER — Inpatient Hospital Stay (HOSPITAL_COMMUNITY): Payer: Medicare Other | Admitting: Physical Therapy

## 2013-02-15 ENCOUNTER — Inpatient Hospital Stay (HOSPITAL_COMMUNITY): Payer: Medicare Other

## 2013-02-15 LAB — CBC
MCH: 32.1 pg (ref 26.0–34.0)
MCHC: 35.1 g/dL (ref 30.0–36.0)
MCV: 91.3 fL (ref 78.0–100.0)
Platelets: 231 10*3/uL (ref 150–400)
RDW: 14.7 % (ref 11.5–15.5)

## 2013-02-15 LAB — BASIC METABOLIC PANEL
BUN: 15 mg/dL (ref 6–23)
CO2: 15 mEq/L — ABNORMAL LOW (ref 19–32)
CO2: 16 mEq/L — ABNORMAL LOW (ref 19–32)
Calcium: 7.6 mg/dL — ABNORMAL LOW (ref 8.4–10.5)
Calcium: 7.9 mg/dL — ABNORMAL LOW (ref 8.4–10.5)
Creatinine, Ser: 0.63 mg/dL (ref 0.50–1.35)
Creatinine, Ser: 0.67 mg/dL (ref 0.50–1.35)
GFR calc non Af Amer: 88 mL/min — ABNORMAL LOW (ref >90)
GFR calc non Af Amer: 86 mL/min — ABNORMAL LOW (ref >90)
Glucose, Bld: 87 mg/dL (ref 70–99)
Sodium: 128 mEq/L — ABNORMAL LOW (ref 135–145)
Sodium: 131 mEq/L — ABNORMAL LOW (ref 135–145)

## 2013-02-15 MED ORDER — SODIUM CHLORIDE 0.9 % IV SOLN
250.0000 mg | Freq: Two times a day (BID) | INTRAVENOUS | Status: DC
  Administered 2013-02-15: 250 mg via INTRAVENOUS
  Filled 2013-02-15 (×4): qty 2.5

## 2013-02-15 NOTE — Progress Notes (Signed)
Pt c/o SOB and CP; Pt confused and speech is slurred. VSS; Dr. Posey Rea notified. CXR, lab work and 12 lead EKG obtained. Will continue to monitor pt

## 2013-02-15 NOTE — Progress Notes (Signed)
Patient ID: Shawn Chen, male   DOB: 11-13-1926, 79 y.o.   MRN: [id number] Patient ID: Shawn Chen, male   DOB: 06-11-1927, 79 y.o.   MRN: [id number] Subjective/Complaints:  13/52.  79 year old patient who is status post a craniotomy for subdural hematoma. Medical problems include a history of multiple myeloma. Serum sodium yesterday 130 and has decreased to 128 this morning. Yesterday early morning the patient was noted by the staff to be of much more lethargic and later in the morning became unresponsive with a Cheyne-Stokes respiratory pattern. He was noted to have a seizure and felt to be post ictal. His mental status improved he remains alert but quite weak. He has been made n.p.o. and has been support with IV fluids. Keppra has been changed to IV. He has been maintained on IV metoprolol as well as Cipro but other medications have been on hold.  Repeat head CT without contrast do to his change in mental status was unchanged  Objective: Vital Signs: Blood pressure 149/76, pulse 83, temperature 97.8 F (36.6 C), temperature source Oral, resp. rate 18, height 5\' 10"  (1.778 m), weight 62.732 kg (138 lb 4.8 oz), SpO2 97.00%. Ct Head Wo Contrast  02/14/2013   *RADIOLOGY REPORT*  Clinical Data: Subdural hematoma.  Somnolence.  CT HEAD WITHOUT CONTRAST  Technique:  Contiguous axial images were obtained from the base of the skull through the vertex without contrast.  Comparison: CT 02/12/2013  Findings: Right craniotomy for subdural drainage.  Mixed density subdural hematoma is unchanged.  The high density component measures up to 18 mm, unchanged.  There is also significant intermediate to low density component to the hematoma.  Mass effect on the right cerebral hemisphere is similar.  5 mm midline shift to the left is unchanged.  There is generalized atrophy.  Chronic microvascular ischemic change in the white matter.  No acute ischemic infarct.  IMPRESSION: Moderate to large mixed density subdural  hematoma on the right is unchanged.  5 mm midline shift unchanged.   Original Report Authenticated By: [name], M.D.    Recent Labs  02/13/13 0450 02/14/13 1020  WBC 6.8 6.0  HGB 12.1* 11.5*  HCT 34.1* 32.4*  PLT 284 273    Recent Labs  02/14/13 1020 02/15/13 0525  NA 132* 128*  K 3.7 3.7  CL 102 101  GLUCOSE 101* 87  BUN 18 15  CREATININE 0.79 0.63  CALCIUM 7.9* 7.6*   CBG (last 3)  No results found for this basename: GLUCAP,  in the last 72 hours  Wt Readings from Last 3 Encounters:  02/11/13 62.732 kg (138 lb 4.8 oz)  02/08/13 65.5 kg (144 lb 6.4 oz)  02/08/13 65.5 kg (144 lb 6.4 oz)    Physical Exam:  Constitutional: He appears well-developed and well-nourished. No distress but quite weak  HENT:  Head: Normocephalic.  Right crani incision with staples in place.  Eyes: Conjunctivae and EOM are normal. Pupils are equal, round, and reactive to light. Right eye exhibits no discharge. Left eye exhibits no discharge. No scleral icterus.  Neck: Normal range of motion. Neck supple. No JVD present. No tracheal deviation present. No thyromegaly present.  Cardiovascular: Normal rate and regular rhythm. Exam reveals no gallop and no friction rub.  No murmur heard.  Pulmonary/Chest: Effort normal and breath sounds normal. No stridor. No respiratory distress. He has no wheezes. He has no rales. He exhibits no tenderness.  Abdominal: Soft. Bowel sounds are normal. He exhibits no distension and  no mass. There is no tenderness. There is no rebound and no guarding.  Musculoskeletal: He exhibits no edema and no tenderness.  Lymphadenopathy:  He has no cervical adenopathy.  Neurological: He is  alert and communicative ; L sided weakness  Extremities- SCDs in place  Assessment/Plan: 1. Functional deficits secondary to right fronto-parietal SDH which require 3+ hours per day of interdisciplinary therapy in a comprehensive inpatient rehab setting. 2.  seizure disorder. Will  continue IV Keppra. 3 Hyponatremia- slight clinical worsening compared to yesterday. We'll repeat labs in the morning 4. Dysphagia.  We'll support with IV fluids and IV meds today. Repeat swallowing evaluation tomorrow and hopefull to advance diet       LOS (Days) 4 A FACE TO FACE EVALUATION WAS PERFORMED  Shawn Chen 02/15/2013 7:51 AM

## 2013-02-15 NOTE — Progress Notes (Signed)
Lethargic during the night, but awake every time RN entered room. Patient without complaint of. Shawn Chen A

## 2013-02-15 NOTE — Progress Notes (Signed)
Physical Therapy Note  Patient Details  Name: Shawn Chen MRN: [id number] Date of Birth: [birth year] Today's Date: 02/15/2013  1000-1055 (55 minutes) individual Pain: no reported c/o pain Focus of treatment: Therapeutic activities focused on static sitting alignment/balance Treatment: Pt in bed upon arrival and alert to person, place; Oxygen sats > 92% on 3 L [place]; supine to sit max assist ; sitting edge of bed max assist with posterior/left lean; transfers max assist scoot to squat/pivot + second person for safety; sitting edge of mat max assist initially performing reaching activities to right and forward trunk leans with max assist; pt able to sit with mod assist post treatment; returned to bed (incontinent of stool) ; bed alarm activated.   1415 Pt missed 45 minute PT session secondary to reported difficulty breathing / awaiting EKG/ lab tests.    Jehiel Koepp,JIM 02/15/2013, 10:54 AM

## 2013-02-15 NOTE — Progress Notes (Signed)
Occupational Therapy Note  Patient Details  Name: Shawn Chen MRN: [id number] Date of Birth: 1927-01-29 Today's Date: 02/15/2013 Time:  1120-  1220  ( ) Pain:  none Individual session Wife, [name] present during session. Pt. Lying In bed upon OT arrival with eyes closed.  Addressed bed mobility during bathing and dressing.  Pt. Very drowsy.  Performed rolling to right and left about 15 times during course of bathing and dressing.  Provided manual assistance for crossing legs and arms to reach to roll.  By end of session, pt more alert and smiling.    Audi, Wettstein 02/15/2013, 11:25 AM

## 2013-02-16 ENCOUNTER — Inpatient Hospital Stay (HOSPITAL_COMMUNITY): Payer: Medicare Other

## 2013-02-16 ENCOUNTER — Inpatient Hospital Stay (HOSPITAL_COMMUNITY): Payer: Medicare Other | Admitting: Speech Pathology

## 2013-02-16 LAB — BLOOD GAS, ARTERIAL
Drawn by: 277331
O2 Content: 1 L/min
O2 Saturation: 99.3 %
pCO2 arterial: 17.6 mmHg — CL (ref 35.0–45.0)
pO2, Arterial: 138 mmHg — ABNORMAL HIGH (ref 80.0–100.0)

## 2013-02-16 LAB — BASIC METABOLIC PANEL
BUN: 14 mg/dL (ref 6–23)
Calcium: 7.6 mg/dL — ABNORMAL LOW (ref 8.4–10.5)
GFR calc non Af Amer: 87 mL/min — ABNORMAL LOW (ref >90)
Glucose, Bld: 90 mg/dL (ref 70–99)

## 2013-02-16 MED ORDER — TROLAMINE SALICYLATE 10 % EX CREA
TOPICAL_CREAM | Freq: Three times a day (TID) | CUTANEOUS | Status: DC
  Administered 2013-02-16: 17:00:00 via TOPICAL

## 2013-02-16 MED ORDER — IPRATROPIUM BROMIDE 0.02 % IN SOLN: 0.5000 mg | Freq: Four times a day (QID) | RESPIRATORY_TRACT | Status: DC

## 2013-02-16 MED ORDER — VALPROATE SODIUM 500 MG/5ML IV SOLN
250.0000 mg | Freq: Two times a day (BID) | INTRAVENOUS | Status: DC
  Administered 2013-02-16 – 2013-02-19 (×6): 250 mg via INTRAVENOUS
  Filled 2013-02-16 (×9): qty 2.5

## 2013-02-16 MED ORDER — CHLORHEXIDINE GLUCONATE 0.12 % MT SOLN
15.0000 mL | Freq: Four times a day (QID) | OROMUCOSAL | Status: DC
  Administered 2013-02-16 – 2013-02-19 (×9): 15 mL via OROMUCOSAL
  Filled 2013-02-16 (×14): qty 15

## 2013-02-16 MED ORDER — BIOTENE DRY MOUTH MT LIQD
15.0000 mL | Freq: Two times a day (BID) | OROMUCOSAL | Status: DC
  Administered 2013-02-16 (×2): 15 mL via OROMUCOSAL

## 2013-02-16 MED ORDER — IPRATROPIUM BROMIDE 0.02 % IN SOLN
0.5000 mg | Freq: Four times a day (QID) | RESPIRATORY_TRACT | Status: DC
  Administered 2013-02-16 – 2013-02-19 (×11): 0.5 mg via RESPIRATORY_TRACT
  Filled 2013-02-16 (×3): qty 2.5
  Filled 2013-02-16: qty 7.5
  Filled 2013-02-16 (×7): qty 2.5

## 2013-02-16 MED ORDER — BIOTENE DRY MOUTH MT LIQD
15.0000 mL | Freq: Three times a day (TID) | OROMUCOSAL | Status: DC
  Administered 2013-02-16 – 2013-02-19 (×9): 15 mL via OROMUCOSAL

## 2013-02-16 MED ORDER — LEVALBUTEROL HCL 0.63 MG/3ML IN NEBU: 0.6300 mg | INHALATION_SOLUTION | Freq: Three times a day (TID) | RESPIRATORY_TRACT | Status: DC

## 2013-02-16 MED ORDER — CITALOPRAM HYDROBROMIDE 10 MG PO TABS
10.0000 mg | ORAL_TABLET | Freq: Every day | ORAL | Status: DC
  Filled 2013-02-16: qty 1

## 2013-02-16 MED ORDER — LEVALBUTEROL HCL 0.63 MG/3ML IN NEBU
0.6300 mg | INHALATION_SOLUTION | Freq: Three times a day (TID) | RESPIRATORY_TRACT | Status: DC
  Filled 2013-02-16 (×3): qty 3

## 2013-02-16 MED ORDER — CAPSAICIN 0.075 % EX CREA
TOPICAL_CREAM | Freq: Three times a day (TID) | CUTANEOUS | Status: DC
  Administered 2013-02-16 – 2013-02-19 (×7): via TOPICAL
  Filled 2013-02-16: qty 60

## 2013-02-16 MED ORDER — FLUTICASONE PROPIONATE 50 MCG/ACT NA SUSP
1.0000 | Freq: Every day | NASAL | Status: DC
  Administered 2013-02-16 – 2013-02-19 (×4): 1 via NASAL
  Filled 2013-02-16: qty 16

## 2013-02-16 MED ORDER — CHLORHEXIDINE GLUCONATE 0.12 % MT SOLN
15.0000 mL | Freq: Two times a day (BID) | OROMUCOSAL | Status: DC
  Administered 2013-02-16: 15 mL via OROMUCOSAL
  Filled 2013-02-16 (×3): qty 15

## 2013-02-16 MED ORDER — LEVALBUTEROL HCL 0.63 MG/3ML IN NEBU
0.6300 mg | INHALATION_SOLUTION | Freq: Four times a day (QID) | RESPIRATORY_TRACT | Status: DC
  Administered 2013-02-17 – 2013-02-19 (×10): 0.63 mg via RESPIRATORY_TRACT
  Filled 2013-02-16 (×14): qty 3

## 2013-02-16 NOTE — Progress Notes (Signed)
PT working with patient called nurse into room due to erratic heart rate on monitor.  Informed me the HR was jumping from 40's to 90's.  Apical pulse irregular but consistent with radial pulse.  Patient has history of AFib.  Patient appears to be in no immediate distress at this time. Shawn Chen

## 2013-02-16 NOTE — Progress Notes (Signed)
Abnormal ABG's reported pH- 7.48; CO2 17.6; Bicarb-13 from [place] in RT.  Pam love, PA notified- no orders at this time.  Will continue to monitor.  Patient stable- denies SOB at this time. Dani Gobble

## 2013-02-16 NOTE — Progress Notes (Signed)
Physical Therapy Session Note  Patient Details  Name: Shawn Chen MRN: [id number] Date of Birth: 1927-02-07  Today's Date: 02/16/2013 Time: 4098-1191 Time Calculation (min): 45 min  Short Term Goals: Week 1:  PT Short Term Goal 1 (Week 1): Pt to perform bed mobility w/ max A x1person PT Short Term Goal 2 (Week 1): Pt to maintain midline static sitting w/ max A x1person PT Short Term Goal 3 (Week 1): Pt to perform transfer bed<>w/c w/ max A x1person PT Short Term Goal 4 (Week 1): Pt to perform static standing w/ mod A x2persons PT Short Term Goal 5 (Week 1): Pt to propel w/c x15' w/ mod A x1person  Skilled Therapeutic Interventions/Progress Updates:    1:1. Pt semi-reclined in bed upon entry, presenting w/ lethargy, eyes closed and dysarthria. Per wife's report, prior to therapist's entry pt had been more alert and conversing w/ her and son. Pt found to be incontinent of urine, req total A x1-2persons for B rolling and clean up. Pt req total A x2persons to t/f sup>sit EOB w/ HOB elevated. Attempted focus on static sitting, however, pt req total A x1person and unable to correct consistent LOB to posterior L direction. Pt presenting w/ fluctuation of BP and HR throughout tx session. Pt's HR cyclically fluctuating from 30s to 120s  regardless of positioning. RN in room to assess HR concerns. Pt also noted to have episode of orthostatic hypotension during transition from sup>sit EOB. Pt presenting w/ poor pallor and verbalized feeling dizzy after being cued. Pt presenting w/ increased alertness, improved ability to maintain eyes open and improved clarity of speech in sitting initially, however, decreased w/ fatigue. Pt req total A x2persons for t/f sit>sup and starting to fall asleep immediately w/ R arm over head. Able to wake pt via sternal rubs and questioned regarding arm placement, pt answered both yes and no to having a headache. Pt able to maintain SaO2 >95% throughout tx session on RA. RN  aware of vital concerns during tx session. Tx session ended early due to pt fatigue and concerns regarding vital stability. Pt semi-reclined in bed, all needs w/in reach, bed alarm on and wife in room.   Therapy Documentation Precautions:  Precautions Precautions: Fall Restrictions Weight Bearing Restrictions: No  General: Amount of Missed PT Time (min): 15 Minutes Missed Time Reason: Patient fatigue;Other (comment) (BP contraindication)  Vital Signs: Therapy Vitals BP: 127/96 mmHg Patient Position, if appropriate: Lying (semi-reclined) Oxygen Therapy SpO2: 97 % O2 Device: None (Room air)  See FIM for current functional status  Therapy/Group: Individual Therapy  Denzil Hughes 02/16/2013, 2:00 PM

## 2013-02-16 NOTE — Progress Notes (Signed)
Subjective/Complaints: Intermittent lethargy over the weekend. Fairly awake today. Denies any problems A 12 point review of systems has been performed and if not noted above is otherwise negative.   Objective: Vital Signs: Blood pressure 151/89, pulse 99, temperature 97.8 F (36.6 C), temperature source Oral, resp. rate 19, height 5\' 10"  (1.778 m), weight 62.732 kg (138 lb 4.8 oz), SpO2 98.00%. Dg Chest 2 View  02/15/2013   *RADIOLOGY REPORT*  Clinical Data: Shortness of breath.  CHEST - 2 VIEW  Comparison: 02/08/2013  Findings: There is stable chronic lung disease/COPD.  No evidence of pulmonary edema, airspace consolidation or visible pleural fluid.  Stable mild cardiomegaly noted.  There is a stable moderate sized hiatal hernia.  IMPRESSION: Stable COPD and moderate sized hiatal hernia.  No acute findings.   Original Report Authenticated By: Irish Lack, M.D.   Ct Head Wo Contrast  02/14/2013   *RADIOLOGY REPORT*  Clinical Data: Subdural hematoma.  Somnolence.  CT HEAD WITHOUT CONTRAST  Technique:  Contiguous axial images were obtained from the base of the skull through the vertex without contrast.  Comparison: CT 02/12/2013  Findings: Right craniotomy for subdural drainage.  Mixed density subdural hematoma is unchanged.  The high density component measures up to 18 mm, unchanged.  There is also significant intermediate to low density component to the hematoma.  Mass effect on the right cerebral hemisphere is similar.  5 mm midline shift to the left is unchanged.  There is generalized atrophy.  Chronic microvascular ischemic change in the white matter.  No acute ischemic infarct.  IMPRESSION: Moderate to large mixed density subdural hematoma on the right is unchanged.  5 mm midline shift unchanged.   Original Report Authenticated By: [name], M.D.    Recent Labs  02/14/13 1020 02/15/13 1420  WBC 6.0 6.3  HGB 11.5* 12.6*  HCT 32.4* 35.9*  PLT 273 231    Recent Labs  02/15/13 1420  02/16/13 0517  NA 131* 130*  K 3.9 3.9  CL 101 102  GLUCOSE 89 90  BUN 14 14  CREATININE 0.67 0.65  CALCIUM 7.9* 7.6*   CBG (last 3)  No results found for this basename: GLUCAP,  in the last 72 hours  Wt Readings from Last 3 Encounters:  02/11/13 62.732 kg (138 lb 4.8 oz)  02/08/13 65.5 kg (144 lb 6.4 oz)  02/08/13 65.5 kg (144 lb 6.4 oz)    Physical Exam:  Constitutional: He appears well-developed and well-nourished. No distress.  HENT:  Head: Normocephalic.  Right crani incision with staples in place.  Eyes: Conjunctivae and EOM are normal. Pupils are equal, round, and reactive to light. Right eye exhibits no discharge. Left eye exhibits no discharge. No scleral icterus.  Neck: Normal range of motion. Neck supple. No JVD present. No tracheal deviation present. No thyromegaly present.  Cardiovascular: Normal rate and regular rhythm. Exam reveals no gallop and no friction rub.  No murmur heard.  Pulmonary/Chest: Effort normal and breath sounds normal. No stridor. No respiratory distress. He has no wheezes. He has no rales. He exhibits no tenderness.  Abdominal: Soft. Bowel sounds are normal. He exhibits no distension and no mass. There is no tenderness. There is no rebound and no guarding.  Musculoskeletal: He exhibits no edema and no tenderness.  Lymphadenopathy:  He has no cervical adenopathy.  Neurological: He is alert. He exhibits normal muscle tone.  more alert.  He displays a left central 7 and slurring of his speech, unchanged.  Some apraxia  on left was  seen. LUE 2+ to 3- at deltoid, tricep, biceps, 2 to 2+with HI, wrist. Senses pain in left arm and leg. LLE is 3 to 3+ with HF, KE, ADF and APF. Right upper extremity and lower grossly 4/5.    Skin: Skin is warm and dry. He is not diaphoretic.    Assessment/Plan: 1. Functional deficits secondary to right fronto-parietal SDH which require 3+ hours per day of interdisciplinary therapy in a comprehensive inpatient rehab  setting. Physiatrist is providing close team supervision and 24 hour management of active medical problems listed below. Physiatrist and rehab team continue to assess barriers to discharge/monitor patient progress toward functional and medical goals.  I would like SLP to re-eval for swallowing. I would like to restart him on a diet today FIM: FIM - Bathing Bathing: 1: Total-Patient completes 0-2 of 10 parts or less than 25%  FIM - Lower Body Dressing/Undressing Lower body dressing/undressing: 1: Total-Patient completed less than 25% of tasks  FIM - Toileting Toileting steps completed by patient: Adjust clothing prior to toileting;Performs perineal hygiene;Adjust clothing after toileting Toileting: 1: Total-Patient completed zero steps, helper did all 3  FIM - Diplomatic Services operational officer Devices: Bedside commode Toilet Transfers: 1-To toilet/BSC: Total A (helper does all/Pt. < 25%);1-From toilet/BSC: Total A (helper does all/Pt. < 25%);1-Two helpers  FIM - Banker Devices: Bed rails;Arm rests Bed/Chair Transfer: 1: Two helpers  FIM - Locomotion: Wheelchair Locomotion: Wheelchair: 1: Total Assistance/staff pushes wheelchair (Pt<25%) FIM - Locomotion: Ambulation Locomotion: Ambulation: 0: Activity did not occur  Comprehension Comprehension Mode: Auditory Comprehension: 3-Understands basic 50 - 74% of the time/requires cueing 25 - 50%  of the time  Expression Expression Mode: Verbal Expression: 2-Expresses basic 25 - 49% of the time/requires cueing 50 - 75% of the time. Uses single words/gestures.  Social Interaction Social Interaction: 3-Interacts appropriately 50 - 74% of the time - May be physically or verbally inappropriate.  Problem Solving Problem Solving: 1-Solves basic less than 25% of the time - needs direction nearly all the time or does not effectively solve problems and may need a restraint for  safety  Memory Memory: 2-Recognizes or recalls 25 - 49% of the time/requires cueing 51 - 75% of the time  Medical Problem List and Plan:  1. DVT Prophylaxis/Anticoagulation: Mechanical: Sequential compression devices, below knee Bilateral lower extremities  2. Chronic back pain/Pain Management: Uses aleve and "salve" at home.  3. Mood:Continue celexa. LCSW to follow for evaluation.  4. Neuropsych: This patient is intermittently capable of making decisions on his own behalf.  6. Multiple Myeloma: recent diagnosis. Chemo on hold currently.  7. GERD: Continue protonix. On megace due to poor appetite recently.  8. HTN: Will monitor with bid checks. Continue norvasc and metoprolol.  9. H/o severe constipation: will set bowel program to prevent recurrence.  10 Hyponatremia: continues around 130 11. Altered mental status/lethargy  -given SDH/midline shift/UTI/age fluctuations are to be expected  -rx UTI- pseudomonas-----cipro started Friday and changed to IV  LOS (Days) 5 A FACE TO FACE EVALUATION WAS PERFORMED  SWARTZ,ZACHARY T 02/16/2013 8:54 AM

## 2013-02-16 NOTE — Progress Notes (Signed)
Speech Language Pathology Daily Session Note  Patient Details  Name: Shawn Chen MRN: [id number] Date of Birth: 20-May-1927  Today's Date: 02/16/2013 Time: 1415-1450 Time Calculation (min): 35 min  Short Term Goals: Week 1: SLP Short Term Goal 1 (Week 1): Pt will consume current diet and uitlize swallowing compensatory strategies to minimize overt s/s of aspiration with Mod verbal and tactile cues.[name] SLP Short Term Goal 2 (Week 1): Pt will demonstrate efficient mastication of Dys. 2 textures without overt s/s of aspiration and Mod verbal cues.  SLP Short Term Goal 3 (Week 1): Pt will sustain attention to functional tasks for 5 minutes with Min A verbal cues for redirection SLP Short Term Goal 4 (Week 1): Pt will orient to time, place and situation with Min A verbal and visual cues. SLP Short Term Goal 5 (Week 1): Pt will utilize call bell to express wants/needs with Max A verbal and question cues.  SLP Short Term Goal 6 (Week 1): Pt will identify 1 physical and 1 cognitive deficit with Mod A semantic and question cues.   Skilled Therapeutic Interventions: Treatment focus on re-assessment of swallow to determine if it was safe to re-initiate p.o and family education. SLP facilitated session by providing verbal explanation to patient and his wife in regards to clinical reasoning for not providing PO trials at this time. Pt was lethargic with decreased speech intelligibility and labored breathing. Pt's wife asking appropriate questions and verbalized understanding of all information provided.    FIM:  Comprehension Comprehension Mode: Auditory Comprehension: 3-Understands basic 50 - 74% of the time/requires cueing 25 - 50%  of the time Expression Expression Mode: Verbal Expression: 2-Expresses basic 25 - 49% of the time/requires cueing 50 - 75% of the time. Uses single words/gestures. Social Interaction Social Interaction: 2-Interacts appropriately 25 - 49% of time - Needs frequent  redirection. Problem Solving Problem Solving: 1-Solves basic less than 25% of the time - needs direction nearly all the time or does not effectively solve problems and may need a restraint for safety  Pain Pain Assessment Pain Assessment: No/denies pain  Therapy/Group: Individual Therapy  Djuana Littleton 02/16/2013, 3:49 PM

## 2013-02-16 NOTE — Progress Notes (Signed)
Awake most of night, without specific complaint of. Small incontinent BM. Patient unaware when incontinent. Wearing condom cath. NS at 40ml/hr. Staples to right crani. Patient picks at incision at nights. Speech garbled. O2 at 2 L/M via [place]. Alfredo Martinez A

## 2013-02-16 NOTE — Progress Notes (Signed)
Occupational Therapy Note  Patient Details  Name: Shawn Chen MRN: [id number] Date of Birth: March 05, 1927 Today's Date: 02/16/2013  Time:0900-1000  (60 min) Pain:none Individual session Pt. Lying In bed upon OT arrival with eyes closed.  Pt. Stated his name and given choices verbalized day of week.  .   Rolled to right and left with total assist +2, pt = 5%.  Pt. Washed face after sitting him in chair position in bed.  He kept eyes closed for 95 % of session.  He responded 10 % to questions.  Pt lethargic.  Has not been sleeping at night per RN.  Left pt in bed with bed alarm and call bell,phone within reach.      Kyren, Knick 02/16/2013, 9:08 AM

## 2013-02-16 NOTE — H&P (Signed)
PM&R follow up note  Pt still with subjective SOB although sats are good, cxr is clear. ABG indicates elevated pO2, low CO2 levels. Enzymes normal, EKG shows afib  Plan: 1. DC oxygen, make sure oxygen saturations are acurate---I suspect they are not based on ABG. 2. DC keppra---convert to VPA, and then to tegretol when he's taking PO again 3. Continue IVF for now 4. Close observation 5. Pt was seen by pulmonary/CCS---they felt he was pos-ictal. I am not sure this was the case. He has been waxing and waning for the past several days.  Ranelle Oyster, MD, [facility] Physical Medicine & Rehabilitation

## 2013-02-16 NOTE — Progress Notes (Signed)
Patient called needing to urinate.  Patient's brief is dry.  Unable to void using urinal at this time.  Patient had difficulty staying awake or alert enough to answer questions or focus on trying to use urinal.  Will continue to monitor. Shawn Chen

## 2013-02-16 NOTE — Progress Notes (Signed)
Administered neb treatment atrovent per [name], PA request.  Patient tolerated well, gets anxious with mask. Patient now able to hold conversation with wife without getting SOB. Will continue to monitor. Dani Gobble

## 2013-02-16 NOTE — Progress Notes (Signed)
Patient resting comfortably in room.  Family at bedside.  Patient continues to be in and out of sleep and cannot stay awake long enough for a full conversation, therefore is still not taking anything by mouth.  ST to come back to try and reevaluate patient later today. Dani Gobble

## 2013-02-16 NOTE — Progress Notes (Signed)
SLP Cancellation Note  Patient Details Name: Shawn Chen MRN: [id number] DOB: Apr 19, 1927   Cancelled treatment:        SLP attempted to see patient for re-assessment of swallow to determine if it was safe to re-initiate p.o.  Patient received in bed with 2L O2 via nasal cannula with SpO2 readings 98% per fingertip pulse oximeter.  Patient demonstrated labored breathing and reported feeling SOB. Patient also with rapidly fluctuating HR per fingertip pulse oximeter.  At this time SLP recommends to hold p.o. Until patient is more medically stable.  Patient notified of SLP's recommendations and in agreement.    Fae Pippin, M.A., CCC-SLP [phone]  Contrina Orona 02/16/2013, 10:24 AM

## 2013-02-17 ENCOUNTER — Inpatient Hospital Stay (HOSPITAL_COMMUNITY): Payer: Medicare Other | Admitting: Occupational Therapy

## 2013-02-17 ENCOUNTER — Ambulatory Visit: Payer: Medicare Other

## 2013-02-17 ENCOUNTER — Other Ambulatory Visit: Payer: Medicare Other | Admitting: Lab

## 2013-02-17 ENCOUNTER — Inpatient Hospital Stay (HOSPITAL_COMMUNITY): Payer: Medicare Other

## 2013-02-17 DIAGNOSIS — W19XXXA Unspecified fall, initial encounter: Secondary | ICD-10-CM

## 2013-02-17 DIAGNOSIS — S069X9A Unspecified intracranial injury with loss of consciousness of unspecified duration, initial encounter: Secondary | ICD-10-CM

## 2013-02-17 MED ORDER — AMANTADINE HCL 100 MG PO CAPS
100.0000 mg | ORAL_CAPSULE | Freq: Every day | ORAL | Status: DC
  Filled 2013-02-17 (×3): qty 1

## 2013-02-17 NOTE — Progress Notes (Signed)
Physical Therapy Session Note  Patient Details  Name: NOX TALENT MRN: [id number] Date of Birth: 1926-11-04  Today's Date: 02/17/2013 Time: 4098-1191 Time Calculation (min): 45 min  Short Term Goals: Week 1:  PT Short Term Goal 1 (Week 1): Pt to perform bed mobility w/ max A x1person PT Short Term Goal 2 (Week 1): Pt to maintain midline static sitting w/ max A x1person PT Short Term Goal 3 (Week 1): Pt to perform transfer bed<>w/c w/ max A x1person PT Short Term Goal 4 (Week 1): Pt to perform static standing w/ mod A x2persons PT Short Term Goal 5 (Week 1): Pt to propel w/c x15' w/ mod A x1person  Skilled Therapeutic Interventions/Progress Updates:    1:1. Pt reclined in tilt-in-space w/c upon entry, awake. Wife reports that pt has been sitting since approx 11 AM (2hr) and alert entire time w/ cues. Pt w/ good tolerance to static standing w/ use of [place] Dec + x2 attempts. Pt able to initiate placement of R LE on base of [place] Dec + as well as initiate removal of B LE from machine w/ verbal cues. First attempt, pt able to tolerate standing approx for total A x1person to pull up pants. Second attempt, pt able to tolerate standing x85min for focus on midline orientation. Pt able to hug wife w/ R UE and max A x1person for management of arm. Pt presenting w/ consistent lean to R side as well as head positioned to R side, unable to correct despite v/c + t/c. Pt's vitals demonstrated increased stability this tx session, no labored breathing noted and although orthostatic hypotension noted pt asymptomatic. Pt req total A x2persons for squat pivot t/f w/c>bed to L side as well as t/f sit>sup at end of tx session. Pt attempting to pull w/ R UE on w/c arm rest vs. Push during t/f. Increased ease of transfers noted when arm rests/bed rails removed to prevent pulling w/ R UE. Pt demonstrating increased fatigue and decreased ability to maintain eyes open over course of tx session. Pt w/ intermittent bouts of  increased alertness during transitional changes and upon new people coming in room (therapists, RN, visitors). Pt semi-reclined in bed at end of tx session, all needs in reach, bed alarm on and wife/visitors in room. RN aware.   Therapy Documentation Precautions:  Precautions Precautions: Fall Restrictions Weight Bearing Restrictions: No  General: Amount of Missed PT Time (min): 15 Minutes Missed Time Reason: Patient fatigue  Vital Signs: Therapy Vitals Pulse Rate: 56 Resp: 16 BP: 105/58 mmHg Patient Position, if appropriate: Standing (w/ Sara +) Oxygen Therapy SpO2: 99 % O2 Device: None (Room air)  Pain: Pain Assessment Pain Assessment: No/denies pain Pain Score: 0-No pain  See FIM for current functional status  Therapy/Group: Individual Therapy  Denzil Hughes 02/17/2013, 1:52 PM

## 2013-02-17 NOTE — Progress Notes (Signed)
Subjective/Complaints: Intermittent lethargy still an issue. Awake upon my entering today. States breathing is better. A 12 point review of systems has been performed and if not noted above is otherwise negative.   Objective: Vital Signs: Blood pressure 130/80, pulse 87, temperature 98.1 F (36.7 C), temperature source Oral, resp. rate 19, height 5\' 10"  (1.778 m), weight 62.732 kg (138 lb 4.8 oz), SpO2 99.00%. Dg Chest 2 View  02/15/2013   *RADIOLOGY REPORT*  Clinical Data: Shortness of breath.  CHEST - 2 VIEW  Comparison: 02/08/2013  Findings: There is stable chronic lung disease/COPD.  No evidence of pulmonary edema, airspace consolidation or visible pleural fluid.  Stable mild cardiomegaly noted.  There is a stable moderate sized hiatal hernia.  IMPRESSION: Stable COPD and moderate sized hiatal hernia.  No acute findings.   Original Report Authenticated By: Irish Lack, M.D.    Recent Labs  02/14/13 1020 02/15/13 1420  WBC 6.0 6.3  HGB 11.5* 12.6*  HCT 32.4* 35.9*  PLT 273 231    Recent Labs  02/15/13 1420 02/16/13 0517  NA 131* 130*  K 3.9 3.9  CL 101 102  GLUCOSE 89 90  BUN 14 14  CREATININE 0.67 0.65  CALCIUM 7.9* 7.6*   CBG (last 3)  No results found for this basename: GLUCAP,  in the last 72 hours  Wt Readings from Last 3 Encounters:  02/11/13 62.732 kg (138 lb 4.8 oz)  02/08/13 65.5 kg (144 lb 6.4 oz)  02/08/13 65.5 kg (144 lb 6.4 oz)    Physical Exam:  Constitutional: He appears well-developed and well-nourished. No distress.   HENT:  Head: Normocephalic.  Right crani incision clean Eyes: Conjunctivae and EOM are normal. Pupils are equal, round, and reactive to light. Right eye exhibits no discharge. Left eye exhibits no discharge. No scleral icterus.  Neck: Normal range of motion. Neck supple. No JVD present. No tracheal deviation present. No thyromegaly present.  Cardiovascular: Normal rate and regular rhythm. Exam reveals no gallop and no friction  rub.  No murmur heard.  Pulmonary/Chest: Effort normal and breath sounds normal--NO respiratory distress. He has no wheezes. He has no rales. He exhibits no tenderness.  Abdominal: Soft. Bowel sounds are normal. He exhibits no distension and no mass. There is no tenderness. There is no rebound and no guarding.  Musculoskeletal: He exhibits no edema and no tenderness.  Lymphadenopathy:  He has no cervical adenopathy.  Neurological: He is alert. He exhibits normal muscle tone.  more alert.  He displays a left central 7 and slurring of his speech, unchanged.  Some apraxia on left was  seen. LUE 2+ to 3- at deltoid, tricep, biceps, 2 to 2+with HI, wrist. Senses pain in left arm and leg. LLE is 3 to 3+ with HF, KE, ADF and APF. Right upper extremity and lower grossly 4/5.    Skin: Skin is warm and dry. He is not diaphoretic.    Assessment/Plan: 1. Functional deficits secondary to right fronto-parietal SDH which require 3+ hours per day of interdisciplinary therapy in a comprehensive inpatient rehab setting. Physiatrist is providing close team supervision and 24 hour management of active medical problems listed below. Physiatrist and rehab team continue to assess barriers to discharge/monitor patient progress toward functional and medical goals.  I would like SLP to re-eval for swallowing. I would like to restart him on a diet today FIM: FIM - Bathing Bathing: 1: Total-Patient completes 0-2 of 10 parts or less than 25%  FIM - Upper Body  Dressing/Undressing Upper body dressing/undressing: 1: Total-Patient completed less than 25% of tasks FIM - Lower Body Dressing/Undressing Lower body dressing/undressing: 1: Two helpers  FIM - Toileting Toileting steps completed by patient: Adjust clothing prior to toileting;Performs perineal hygiene;Adjust clothing after toileting Toileting: 1: Total-Patient completed zero steps, helper did all 3  FIM - Diplomatic Services operational officer Devices:  Psychiatrist Transfers: 0-Activity did not occur  FIM - Banker Devices: HOB elevated;Bed rails Bed/Chair Transfer: 1: Two helpers  FIM - Locomotion: Wheelchair Locomotion: Wheelchair: 0: Activity did not occur FIM - Locomotion: Ambulation Locomotion: Ambulation: 0: Activity did not occur  Comprehension Comprehension Mode: Auditory Comprehension: 3-Understands basic 50 - 74% of the time/requires cueing 25 - 50%  of the time  Expression Expression Mode: Verbal Expression: 2-Expresses basic 25 - 49% of the time/requires cueing 50 - 75% of the time. Uses single words/gestures.  Social Interaction Social Interaction: 2-Interacts appropriately 25 - 49% of time - Needs frequent redirection.  Problem Solving Problem Solving: 1-Solves basic less than 25% of the time - needs direction nearly all the time or does not effectively solve problems and may need a restraint for safety  Memory Memory: 2-Recognizes or recalls 25 - 49% of the time/requires cueing 51 - 75% of the time  Medical Problem List and Plan:  1. DVT Prophylaxis/Anticoagulation: Mechanical: Sequential compression devices, below knee Bilateral lower extremities  2. Chronic back pain/Pain Management: Uses aleve and "salve" at home.  3. Mood:Continue celexa. LCSW to follow for evaluation.  4. Neuropsych: This patient is intermittently capable of making decisions on his own behalf.  6. Multiple Myeloma: recent diagnosis. Chemo on hold currently.  -may be worthwhile to have onc way in on MM/effects upon appearance   7. GERD: Continue protonix.   8. HTN: Will monitor with bid checks. Continue norvasc and metoprolol.  9. H/o severe constipation:  bowel program   10 Hyponatremia: continues around 130 11. Altered mental status/lethargy  -given SDH/midline shift/UTI/age fluctuations are to be expected and are occuring  -will add low dose amantadine for arousal.  -rx UTI-  pseudomonas-----cipro started Friday and changed to IV--continue for now  -continue NPO/ follow up with SLP  -keppra changed to vpa 12. Perceived dyspnea---?cognitive/central  -a little better after xopenex, atrovent  -abg not consistent with hypoxic or co2 retaining state---too much oxygen really 13. Dysphagia-  -continue NPO  -need to discuss need for alternative feedings  LOS (Days) 6 A FACE TO FACE EVALUATION WAS PERFORMED  SWARTZ,ZACHARY T 02/17/2013 7:51 AM

## 2013-02-17 NOTE — Progress Notes (Signed)
Occupational Therapy Session Note  Patient Details  Name: Shawn Chen MRN: [id number] Date of Birth: 1926-10-12  Today's Date: 02/17/2013 Time: 0900-0950 Time Calculation (min): 15 min  Short Term Goals: Week 1:  OT Short Term Goal 1 (Week 1): Pt will maintain dynamic sitting balance with mod A in preparation with ADL tasks. OT Short Term Goal 2 (Week 1): Pt will bathe with max assist in supported sitting. OT Short Term Goal 3 (Week 1): Pt will demonstrate orientation x4 with min A.  OT Short Term Goal 4 (Week 1): Pt will demonstrate sustained attention for one grooming task with min verbal cues.   Skilled Therapeutic Interventions/Progress Updates: Time: 0900-0950. Billed for [phone]   Pt engaged in co-treat session with SLP: focused on static/dynamic sitting balance, bed mobility, attention to task, following one-step commands, activity tolerance.  Pt lethargic upon entry to room, laying supine in bed.  HR and O2 monitored throughout session, 02 remained above 90% on RA.  HR ranged from 53-54 bpm to 102 bpm with static sitting.  Pt required total A to sustain static/dynamic sitting balance with frequent verbal cues.  Demonstrated maximal difficulty following one-step commands to correct posture at EOB and to don shirt.  Responsive to verbal interaction at times, speech clarity varied throughout session.   Pt demonstrated difficulty initiating any movement, especially with the LUE after multiple verbal and tactile cues to involve LUE. Pt acknowledged wife upon entry to room.  Required frequent cues to attend to tasks, prompting given to keep eyes open.  Labored breathing noted at times. Pt positioned supine in bed at conclusion of session.  Second Session: Individual session; 1100-1130 Pt engaged in session focused on transfer out of bed to w/c with slide board to encourage initiation of movement and decreasing fear of movement during task. Pt fitted for tilt-in-space w/c.  Total assist  required for bed mobility, supine to sit EOB.  Total assist +2 required for transfer to w/c, pt able to initiate some participation in transfer with increased time and multiple verbal cues.  Pt sat at sink to wash face, multiple verbal and tactile cues to follow one-step commands and transition to next task, total physical assist to wash L hand.   Therapy Documentation Precautions:  Precautions Precautions: Fall Restrictions Weight Bearing Restrictions: No General:   Vital Signs: Therapy Vitals Pulse Rate: 56 Resp: 16 BP: 132/82 mmHg Patient Position, if appropriate: Lying Oxygen Therapy SpO2: 100 % O2 Device: None (Room air) Pain: Pain Assessment Pain Assessment: No/denies pain Pain Score: 0-No pain Patients Stated Pain Goal: 0  See FIM for current functional status  Therapy/Group: Co-Treatment  Kandis Ban 02/17/2013, 11:49 AM

## 2013-02-17 NOTE — Progress Notes (Signed)
INITIAL NUTRITION ASSESSMENT  DOCUMENTATION CODES Per approved criteria  -Severe malnutrition in the context of chronic illness   INTERVENTION: 1. Modify diet; per SLP and MD discretion once medically appropriate and pt more alert.  Pt has been NPO x3 days. 2.  Nutrition-support; recommend enteral nutrition if pt unable to advance diet within 24-48 hrs.  Please consult RD for recommendation if warranted and access obtained.  NUTRITION DIAGNOSIS: Increased nutrient needs related to multiple myeloma with recent chemotherapy as evidenced by MD notes.   Monitor:  1.  Food/Beverage; pt meeting >/=90% estimated needs with tolerance. 2.  Wt/wt change; monitor trends  Reason for Assessment: Consult   79 y.o. male  Admitting Dx: Weakness  ASSESSMENT: Pt with multiple myeloma and started chemotherapy earlier this month. Pt admitted with generalized weakness, lightheadedness, failure to thrive, constipation, anorexia, hypocalcemia, orthostasis, and hypotension.   Pt now admitted to CIR for weakness s/p right crani r/t large R frontopareital chronic SDH with significant mass effect.  Note SLP assessment this AM. Pt currently NPO due to lethargy and weakness. On Megace.   Pt is fatigued during visit, but answers all questions and is able to state his preferences and needs.   Nutrition Focused Physical Exam:  Subcutaneous Fat:  Orbital Region: WNL Upper Arm Region: WNL Thoracic and Lumbar Region: WNL  Muscle:  Temple Region: WNL Clavicle Bone Region: WNL Clavicle and Acromion Bone Region: WNL Scapular Bone Region: WNL Dorsal Hand: WNL Patellar Region: WNL Anterior Thigh Region: WNL Posterior Calf Region: WNL  Edema: none present  Pt has been NPO x3 days.  Height: Ht Readings from Last 1 Encounters:  02/11/13 5\' 10"  (1.778 m)    Weight: Wt Readings from Last 1 Encounters:  02/11/13 138 lb 4.8 oz (62.732 kg)    Ideal Body Weight: 75.4 kg  % Ideal Body Weight:  83%  Wt Readings from Last 10 Encounters:  02/11/13 138 lb 4.8 oz (62.732 kg)  02/08/13 144 lb 6.4 oz (65.5 kg)  02/08/13 144 lb 6.4 oz (65.5 kg)  01/22/13 133 lb 11.2 oz (60.646 kg)  01/18/13 145 lb (65.772 kg)  01/05/13 145 lb (65.772 kg)  12/23/12 145 lb (65.772 kg)  12/16/12 144 lb (65.318 kg)  11/22/12 140 lb (63.504 kg)  07/25/11 151 lb 2 oz (68.55 kg)    Usual Body Weight: 145 lb   % Usual Body Weight: 95%  BMI:  Body mass index is 19.84 kg/(m^2).  Estimated Nutritional Needs: Kcal: 1850-2150 Protein: 90-110g Fluid: 2 L/day  Skin: Incision to head  Diet Order: NPO  EDUCATION NEEDS: -No education needs identified at this time   Intake/Output Summary (Last 24 hours) at 02/17/13 1213 Last data filed at 02/17/13 0500  Gross per 24 hour  Intake      0 ml  Output   1101 ml  Net  -1101 ml    Last BM: smear 9/2, BM 8/31  Labs:   Recent Labs Lab 02/15/13 0525 02/15/13 1420 02/16/13 0517  NA 128* 131* 130*  K 3.7 3.9 3.9  CL 101 101 102  CO2 15* 16* 14*  BUN 15 14 14   CREATININE 0.63 0.67 0.65  CALCIUM 7.6* 7.9* 7.6*  GLUCOSE 87 89 90    CBG (last 3)  No results found for this basename: GLUCAP,  in the last 72 hours  Scheduled Meds: . amantadine  100 mg Oral Daily  . amLODipine  5 mg Oral Daily  . antiseptic oral rinse  15 mL  Mouth Rinse TID WC & HS  . atorvastatin  20 mg Oral q1800  . capsicum   Topical TID  . chlorhexidine  15 mL Mouth Rinse QID  . ciprofloxacin  200 mg Intravenous Q12H  . feeding supplement  237 mL Oral BID BM  . feeding supplement  30 mL Oral Daily  . fluticasone  1 spray Each Nare Daily  . folic acid  2 mg Oral Daily  . ipratropium  0.5 mg Nebulization Q6H  . levalbuterol  0.63 mg Nebulization Q6H  . metoprolol  5 mg Intravenous Q8H  . multivitamin with minerals  1 tablet Oral Daily  . pantoprazole  20 mg Oral Daily  . polyethylene glycol  17 g Oral BID  . valACYclovir  1,000 mg Oral Daily  . valproate sodium  250  mg Intravenous Q12H    Continuous Infusions: . sodium chloride 100 mL/hr at 02/17/13 3086    Past Medical History  Diagnosis Date  . Diverticulosis of colon (without mention of hemorrhage)   . Esophagitis, unspecified   . 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 Personal history of colonic polyps   . Reflux   . Hernia   . RA (rheumatoid arthritis)   . Myeloma   . Multiple myeloma, without mention of having achieved remission(203.00) 01/05/2013  . Subdural hematoma 6/14    secondary to fall    Past Surgical History  Procedure Laterality Date  . Inguinal hernia repair  03/28/1990    dr price  . Orif hip fracture    . Craniotomy Right 02/08/2013    Procedure: Right CRANIOTOMY for HEMATOMA EVACUATION SUBDURAL;  Surgeon: Cristi Loron, MD;  Location: [facility];  Service: Neurosurgery;  Laterality: Right;    Loyce Dys, MS RD LDN Clinical Inpatient Dietitian Pager: [phone] Weekend/After hours pager: [phone]

## 2013-02-17 NOTE — Progress Notes (Signed)
Speech Language Pathology Daily Session Note  Patient Details  Name: Shawn Chen MRN: [id number] Date of Birth: Jun 19, 1926  Today's Date: 02/17/2013 Time: 0900-0915 Time Calculation (min): 15 min  Short Term Goals: Week 1: SLP Short Term Goal 1 (Week 1): Pt will consume current diet and uitlize swallowing compensatory strategies to minimize overt s/s of aspiration with Mod verbal and tactile cues.[name] SLP Short Term Goal 2 (Week 1): Pt will demonstrate efficient mastication of Dys. 2 textures without overt s/s of aspiration and Mod verbal cues.  SLP Short Term Goal 3 (Week 1): Pt will sustain attention to functional tasks for 5 minutes with Min A verbal cues for redirection SLP Short Term Goal 4 (Week 1): Pt will orient to time, place and situation with Min A verbal and visual cues. SLP Short Term Goal 5 (Week 1): Pt will utilize call bell to express wants/needs with Max A verbal and question cues.  SLP Short Term Goal 6 (Week 1): Pt will identify 1 physical and 1 cognitive deficit with Mod A semantic and question cues.   Skilled Therapeutic Interventions: Co-treatment with OT with focus on positioning for PO trials and arousal. Pt was lethargic throughout the session and demonstrated decreased speech intelligibility intermittently.  Pt required Max-Total A and extra time for initiation of tasks. Oral care performed and pt administered trial of ice chip.  Pt demonstrated left anterior spillage with absent swallow initiation and required suctioning to remove ice chip from oral cavity.  HR and O2 monitored throughout session, 02 remained above 90% on RA. HR ranged from 53-54 bpm to 102 bpm with static sitting. Pt also demonstrated labored breathing intermittently throughout the session.    FIM:  Comprehension Comprehension Mode: Auditory Comprehension: 3-Understands basic 50 - 74% of the time/requires cueing 25 - 50%  of the time Expression Expression Mode: Verbal Expression: 2-Expresses  basic 25 - 49% of the time/requires cueing 50 - 75% of the time. Uses single words/gestures. Social Interaction Social Interaction: 2-Interacts appropriately 25 - 49% of time - Needs frequent redirection. Problem Solving Problem Solving: 1-Solves basic less than 25% of the time - needs direction nearly all the time or does not effectively solve problems and may need a restraint for safety Memory Memory: 2-Recognizes or recalls 25 - 49% of the time/requires cueing 51 - 75% of the time  Pain Pain Assessment Pain Assessment: No/denies pain Pain Score: 0-No pain Patients Stated Pain Goal: 0  Therapy/Group: Individual Therapy  Jontay Maston 02/17/2013, 11:00 AM

## 2013-02-17 NOTE — Progress Notes (Signed)
[facility] Individual Statement of Services  Patient Name:  Shawn Chen  Date:  02/17/2013  Welcome to the [facility].  Our goal is to provide you with an individualized program based on your diagnosis and situation, designed to meet your specific needs.  With this comprehensive rehabilitation program, you will be expected to participate in at least 3 hours of rehabilitation therapies Monday-Friday, with modified therapy programming on the weekends.  Your rehabilitation program will include the following services:  Physical Therapy (PT), Occupational Therapy (OT), Speech Therapy (ST), 24 hour per day rehabilitation nursing, Therapeutic Recreation (TR), Neuropsychology, Case Management (Social Worker), Rehabilitation Medicine, Nutrition Services and Pharmacy Services  Weekly team conferences will be held on Tuesdays to discuss your progress.  Your Social Worker will talk with you frequently to get your input and to update you on team discussions.  Team conferences with you and your family in attendance may also be held.  Expected length of stay:  3-4 weeks  Overall anticipated outcome:  Minimal Assistance  Depending on your progress and recovery, your program may change. Your Social Worker will coordinate services and will keep you informed of any changes. Your Social Worker's name and contact numbers are listed  below.  The following services may also be recommended but are not provided by the [facility]:   Driving Evaluations  Home Health Rehabiltiation Services  Outpatient Rehabilitation Services   Arrangements will be made to provide these services after discharge if needed.  Arrangements include referral to agencies that provide these services.  Your insurance has been verified to be:  [employer] Your primary doctor is:  Dr. Buren Kos  Pertinent information will be shared with your doctor and your insurance  company.  Social Worker:  Staci Acosta, LCSW  [phone] or ([phone]  Information discussed with and copy given to patient by: Elvera Lennox, 02/17/2013, 12:39 PM

## 2013-02-17 NOTE — Progress Notes (Addendum)
Social Work Assessment and Plan  Patient Details  Name: Shawn Chen MRN: [id number] Date of Birth: [birth year]  Today's Date: 02/17/2013  Problem List:  Patient Active Problem List   Diagnosis Date Noted  . Encephalopathy acute 02/14/2013  . Seizure 02/14/2013  . Post-ictal state 02/14/2013  . SDH (subdural hematoma) 02/11/2013  . Anemia, unspecified 02/03/2013  . Dehydration 01/22/2013  . Generalized weakness 01/22/2013  . Unspecified constipation 01/22/2013  . Protein-calorie malnutrition, severe 01/22/2013  . Fecal impaction 01/16/2013  . Abdominal pain, unspecified site 01/16/2013  . Nausea with vomiting 01/16/2013  . Multiple myeloma, without mention of having achieved remission(203.00) 01/05/2013  . GERD (gastroesophageal reflux disease) 07/25/2011  . Diverticulosis of colon (without mention of hemorrhage) 07/25/2011  . Hernia, hiatal 07/02/2011  . Candida infection, esophageal 07/02/2011  . Duodenal diverticulum 07/02/2011  . Personal history of colonic polyps 06/26/2011  . Esophageal reflux 06/26/2011  . RA (rheumatoid arthritis) 06/26/2011   Past Medical History:  Past Medical History  Diagnosis Date  . Diverticulosis of colon (without mention of hemorrhage)   . Esophagitis, unspecified   . 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 Personal history of colonic polyps   . Reflux   . Hernia   . RA (rheumatoid arthritis)   . Myeloma   . Multiple myeloma, without mention of having achieved remission(203.00) 01/05/2013  . Subdural hematoma 6/14    secondary to fall   Past Surgical History:  Past Surgical History  Procedure Laterality Date  . Inguinal hernia repair  03/28/1990    dr price  . Orif hip fracture    . Craniotomy Right 02/08/2013    Procedure: Right CRANIOTOMY for HEMATOMA EVACUATION SUBDURAL;  Surgeon: Cristi Loron, MD;  Location: [facility];  Service: Neurosurgery;  Laterality: Right;   Social History:  reports that he quit  smoking about 22 years ago. He has never used smokeless tobacco. He reports that he does not drink alcohol or use illicit drugs.  Family / Support Systems Marital Status: Married How Long?: 62 years Patient Roles: Spouse;Parent Spouse/Significant Other: Adriann Ballweg  [phone] (h) / [phone] (c) Children: Kharee Lesesne - son [phone] (h) / [phone] (c);  Marda Stalker - son Other Supports: Charlestine Massed, RN - dtr-in-law  [phone]) / [phone]; Jamey Ripa - dtr-in-law  [phone] Anticipated Caregiver: wife Ability/Limitations of Caregiver: Wife can provide 24 hr supervision and assist. Caregiver Availability: 24/7 Family Dynamics: very close family  Social History Preferred language: English Religion: Methodist Cultural Background: Animator [employer] Education: some college Read: Yes Write: Yes Employment Status: Retired Date Retired/Disabled/Unemployed: 22 years, but worked on and off since then Age Retired: 64 Fish farm manager Issues: none Guardian/Conservator: none   Abuse/Neglect Physical Abuse: Denies Verbal Abuse: Denies Sexual Abuse: Denies Exploitation of patient/patient's resources: Denies Self-Neglect: Denies  Emotional Status Pt's affect, behavior adn adjustment status: Pt has been lethargic during CSW visits.  Pt's wife feels pt is anxious and stated that he has never been a person to relax or sit still much prior to the last several months.  CSW asked wife if she felt pt is frustrated, and she feels he may be, not expecting this recovery to be so difficult. Recent Psychosocial Issues: Pt has been unsteady for several months and sustained two falls in June 2014.  Pt was at [facility] for skilled nursing and therapies and pt's wife had to advocate for pt to be brought back to the hospital.  Pyschiatric History: none, but wife reports seeing increased anxiety in pt even PTA Substance Abuse History: none  Patient / Family  Perceptions, Expectations & Goals Pt/Family understanding of illness & functional limitations: Was not able to assess with pt.  WIfe reports that this recovery from craniotomy has been more difficult than either of them had anticipated. Premorbid pt/family roles/activities: Pt enjoys sitting in his sunroom and looking at woods and watching wildlife.  He also watches TV.  Pt has good support from family, friends, and church family. Anticipated changes in roles/activities/participation: Pt has not been helping wife with many of the household chores/activities since the spring.  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 Pt/family expectations/goals: "I want to walk independently."  [employer]: None Premorbid Home Care/DME Agencies: has a walker from hip surgery 2010 Transportation available at discharge: wife/family Resource referrals recommended: Neuropsychology  Discharge Planning Living Arrangements: Spouse/significant other Support Systems: Spouse/significant other;Children;Friends/neighbors;Other relatives;Church/faith community Type of Residence: Private residence Insurance Resources: [employer] (Blue Medicare from [employer]) Surveyor, quantity Resources: Social Security Financial Screen Referred: No Living Expenses: Own Money Management: Spouse Does the patient have any problems obtaining your medications?: No Home Management: Pt's wife cares for the inside of the home.  They have a lawn service for the outside of the home. Patient/Family Preliminary Plans: Pt/wife wish for pt to return home at d/c.  Pt's wife is aware that pt will need a lot of assistance.  CSW will have to revisit this with wife as pt's medical status changes. Barriers to Discharge: Steps Social Work Anticipated Follow Up Needs: HH/OP;SNF DC Planning Additional Notes/Comments: Pt/wife would like to go home, but pt's needs may be greater than what wife can provide.   CSW will continue to follow and will assist with appropriate d/c plan. Expected length of stay: Was originally determined to be 3-4 weeks; will watch pt's progress/medical status  Clinical Impression CSW met with pt and his wife multiple times to complete assessment due to pt's lethargy and medical team needing to meet with him to assess his condition.  Pt's wife provided most of the above information, as pt was asleep during most of visit, but would awake and acknowledge wife and CSW before drifting back to sleep.  The family's initial plan was for pt to return home with his wife to care for him.  However, prior to this admission, pt was at [facility] for rehabilitation.  CSW has mentioned to wife that if his needs are greater than the care she can provide, that CSW can assist with other arrangements, such as SNF placement.  At this point, pt's wife is concerned about pt's medical condition, especially pt's inability to swallow and his increased anxiety.  CSW will follow-up with medical team about his anxiety.  CSW made pt's bedside nurse aware of wife's concern of anxiety.  Pt's dtr-in-law, Raynelle Fanning, is an Charity fundraiser and is concerned about pt and wife and what the plan for d/c will be.  Pt's wife trusts her and welcomes her professional opinion, but ultimately she lets the pt make the decisions or she will if he cannot.  Wife admits to usually having the same opinion as her dtr-in-law.  She has given CSW permission to follow up with dtr-in-law tomorrow after pt's team conference.  All family members work, so wife feels a face-to-face family conference may be more difficult, so CSW will contact her via telephone tomorrow.  Family is important to  pt and his wife, as are their friends, neighbors, and church community.  Pt will hopefully become more alert, be able to more fully participate with therapies, and then CSW with pt/family will assist with d/c plan.  Ehtan Delfavero, [place] 02/17/2013, 12:00 PM

## 2013-02-18 ENCOUNTER — Inpatient Hospital Stay (HOSPITAL_COMMUNITY): Payer: Medicare Other | Admitting: Occupational Therapy

## 2013-02-18 ENCOUNTER — Inpatient Hospital Stay (HOSPITAL_COMMUNITY): Payer: Medicare Other | Admitting: Speech Pathology

## 2013-02-18 DIAGNOSIS — Z515 Encounter for palliative care: Secondary | ICD-10-CM

## 2013-02-18 DIAGNOSIS — R29818 Other symptoms and signs involving the nervous system: Secondary | ICD-10-CM

## 2013-02-18 DIAGNOSIS — C9000 Multiple myeloma not having achieved remission: Secondary | ICD-10-CM

## 2013-02-18 DIAGNOSIS — D469 Myelodysplastic syndrome, unspecified: Secondary | ICD-10-CM

## 2013-02-18 MED ORDER — DEXTROSE-NACL 5-0.9 % IV SOLN
INTRAVENOUS | Status: DC
  Administered 2013-02-18: 11:00:00 via INTRAVENOUS

## 2013-02-18 MED ORDER — ACETAMINOPHEN 650 MG RE SUPP
650.0000 mg | Freq: Four times a day (QID) | RECTAL | Status: DC | PRN
Start: 2013-02-18 — End: 2013-02-19

## 2013-02-18 NOTE — Progress Notes (Signed)
Occupational Therapy Discharge Summary  Patient Details  Name: Shawn Chen MRN: [id number] Date of Birth: January 12, 1927  Today's Date: 02/18/2013 Time: 1300-1350 Time Calculation (min): 50 min  Patient has met 0 of 14 long term goals due to family's desires to discontinue rehabilitative services at this time and pursue comfort care.   Patient to discharge at overall Total Assist to total A +2 level for basic mobility and ADL.  Patient's care partner unavailable and is unable  to provide the necessary physical and cognitive assistance at discharge; and family's plan is to discharge to a hospice facility.     Reasons goals not met: due to family's desire to provide comfort care and not rehabilitative.   Recommendation:  No f/u OT at this time.  Equipment: No equipment provided  Reasons for discharge: treatment goals met and discharge from hospital  Patient/family agrees with progress made and goals achieved: Yes  OT Discharge Precautions/Restrictions   fall General Amount of Missed OT Time (min): 10 Minutes Missed Time Reason: Patient fatigue Precautions/Restrictions  Precautions  Precautions: Fall  Restrictions  Weight Bearing Restrictions: No   Pain  Pain Assessment  Pain Assessment: No/denies pain  Home Living/Prior Functioning  Home Living  Family/patient expects to be discharged to:: Private residence  Living Arrangements: Spouse/significant other  Available Help at Discharge: Family  Type of Home: House  Home Access: Stairs to enter  Secretary/administrator of Steps: 4-5  Entrance Stairs-Rails: None  Home Layout: Two level;Able to live on main level with bedroom/bathroom  Lives With: Spouse  Prior Function  Level of Independence: Needs assistance with ADLs;Requires assistive device for independence  Able to Take Stairs?: Yes  ADL   Vision/Perception  Vision - History  Baseline Vision: No visual deficits  Vision - Assessment  Vision Assessment: Vision  impaired - to be further tested in functional context  Additional Comments: (Slow tracking)  Perception  Perception: (Significant lean to R side)  Praxis  Praxis: Impaired  Cognition  Overall Cognitive Status: Impaired/Different from baseline  Arousal/Alertness: Lethargic  Orientation Level: Oriented to person  Attention: Focused;Sustained;Selective;Alternating;Divided  Focused Attention: Appears intact  Sustained Attention: Impaired  Sustained Attention Impairment: Verbal basic;Functional basic  Selective Attention: Impaired  Selective Attention Impairment: Verbal basic;Functional basic  Alternating Attention: Impaired  Alternating Attention Impairment: Verbal basic;Functional basic  Divided Attention: Impaired  Divided Attention Impairment: Verbal basic;Functional basic  Memory: Impaired  Memory Impairment: Decreased recall of new information;Decreased short term memory  Decreased Short Term Memory: Verbal basic  Awareness: Impaired  Awareness Impairment: Intellectual impairment;Emergent impairment  Problem Solving: Impaired  Problem Solving Impairment: Functional basic  Executive Function: Initiating;Sequencing  Sequencing: Impaired  Sequencing Impairment: Functional basic  Initiating: Impaired  Initiating Impairment: Functional basic;Verbal basic  Safety/Judgment: Impaired  Sensation  Sensation  Light Touch: Impaired by gross assessment (L UE/LE appear to be hyposensative)  Stereognosis: Not tested  Hot/Cold: Not tested  Proprioception: Impaired by gross assessment  Additional Comments: Significant lean to R side; Pt stating that he felt like he was leaning to the L side  Coordination  Gross Motor Movements are Fluid and Coordinated: No  Fine Motor Movements are Fluid and Coordinated: No  Coordination and Movement Description: Impaired by tone, decreased initiation and decreased proprioception  Motor  Motor  Motor: Hemiplegia  Motor - Skilled Clinical Observations:  L hemiplegia vs. decrease awareness of L side; increased tone noted in L UE/LE; decreased coordination in R UE/LE  Mobility  Bed Mobility  Bed Mobility: Rolling  Right;Rolling Left;Supine to Sit;Sit to Supine  Rolling Right: 1: +1 Total assist  Rolling Right Details: Tactile cues for placement;Verbal cues for precautions/safety;Verbal cues for sequencing;Tactile cues for initiation  Rolling Left: 2: Max assist  Rolling Left Details: Tactile cues for initiation;Verbal cues for sequencing;Verbal cues for precautions/safety;Tactile cues for placement  Rolling Left Details (indicate cue type and reason): Pt req max A x1person to bend L LE into hooklying position 2/2 tone  Supine to Sit: 1: +1 Total assist  Supine to Sit Details: Tactile cues for initiation;Verbal cues for sequencing;Verbal cues for precautions/safety;Tactile cues for placement  Sitting - Scoot to Edge of Bed: 1: +1 Total assist  Sitting - Scoot to Edge of Bed Details: Tactile cues for posture;Tactile cues for sequencing;Verbal cues for sequencing  Sitting - Scoot to Edge of Bed Details (indicate cue type and reason): Use of chux  Sit to Supine: 1: +2 Total assist  Transfers  Sit to Stand: With upper extremity assist;Other (comment) (HHA x2persons, 3rd person assist with trunk/bottom)  Sit to Stand Details: Verbal cues for sequencing;Tactile cues for sequencing  Stand to Sit: Other (comment);With upper extremity assist (Total assist +3)  Trunk/Postural Assessment  Cervical Assessment  Cervical Assessment: Exceptions to [facility] (forward head posture)  Thoracic Assessment  Thoracic Assessment: Exceptions to [facility] (kyphotic)  Postural Control  Postural Control: Deficits on evaluation  Righting Reactions: delayed/absent, significant pushing to R side  Balance  Balance  Balance Assessed: Yes  Static Sitting Balance  Static Sitting - Balance Support: Right upper extremity supported;Feet supported  Static Sitting - Level of Assistance:  1: +1 Total assist  Static Sitting - Comment/# of Minutes: Significant lean to R side  Dynamic Sitting Balance  Dynamic Sitting - Comments: static and dynamic sitting balance are poor- unable to attain/sustain without constant assist.  Static Standing Balance  Static Standing - Balance Support: Bilateral upper extremity supported  Static Standing - Level of Assistance: Other (comment) (Total assist +3)  Static Standing - Comment/# of Minutes: Kyphotic posture, req cues to improve posture, unable to achieve full upright posture.  Extremity/Trunk Assessment  RUE Assessment  RUE Assessment: Within Functional Limits  RUE AROM (degrees)  Overall AROM Right Upper Extremity: Within functional limits for tasks performed  RUE Strength  RUE Overall Strength Comments: Appears WFL for tasks performed  RUE Tone  RUE Tone: Within Functional Limits  LUE AROM (degrees)  LUE Overall AROM Comments: Overall ROM limited by increased tone in LUE.  LUE Strength  LUE Overall Strength Comments: Unable to complete full AROM against gravity. overall strength 3-  LUE Tone  LUE Tone: Hypertonic, 2 on Modified Ashworth Scale  FIM:  FIM - Eating  Eating Activity: 5: Needs verbal cues/supervision;4: Helper checks for pocketed food  FIM - Toileting  Toileting steps completed by patient: Adjust clothing prior to toileting;Performs perineal hygiene;Adjust clothing after toileting  Toileting: 1: Two helpers  FIM - Therapist, nutritional Devices: Systems developer Transfers: 1-To toilet/BSC: Total A (helper does all/Pt. < 25%);1-From toilet/BSC: Total A (helper does all/Pt. < 25%);1-Two helpers          See FIM for current functional status  Roney Mans [facility] 02/18/2013, 3:42 PM

## 2013-02-18 NOTE — Progress Notes (Signed)
Physical Therapy Session Note  Patient Details  Name: Shawn Chen MRN: [id number] Date of Birth: 1926/09/30  Today's Date: 02/18/2013 Time: 1115-1130 Time Calculation (min): 15 min  Short Term Goals: Week 1:  PT Short Term Goal 1 (Week 1): Pt to perform bed mobility w/ max A x1person PT Short Term Goal 2 (Week 1): Pt to maintain midline static sitting w/ max A x1person PT Short Term Goal 3 (Week 1): Pt to perform transfer bed<>w/c w/ max A x1person PT Short Term Goal 4 (Week 1): Pt to perform static standing w/ mod A x2persons PT Short Term Goal 5 (Week 1): Pt to propel w/c x15' w/ mod A x1person  Skilled Therapeutic Interventions/Progress Updates:    Co-tx this session w/ Speech Therapy to assess swallowing while pt sitting EOB. Pt initially semi-reclined in bed presenting w/ lethargy and difficulty maintaining EO, however, increased alertness and ability to maintain EO when transitioned to sitting EOB. Pt req total A x2persons for t/f sup<>sit EOB as well as total A x1person to maintain sitting EOB during swallow trials. Despite v/c + t/c, pt unable to initiate any postural control w/ use of R UE on bed rail or on arm rest of chair in front of pt to promote trunk flexion. Pt would however initiate change of R UE hand placement throughout tx session. Pt SaO2 decreased to mid 80s 1x during sitting, but resolved to >90% following cues for deep breathing and stable rest of session. BP gradually declined over course of tx session, however, pt able to tolerate sitting for until verbalized feeling dizzy and tired after prompting. Pt w/ no verbalizations of dizziness or fatigue earlier in tx session despite prompting. Pt semi-reclined in bed at end of tx session and immediately asleep, all needs w/in reach and bed alarm on.  Therapy Documentation Precautions:  Precautions Precautions: Fall Restrictions Weight Bearing Restrictions: No General: Amount of Missed PT Time (min): 15  Minutes Missed Time Reason: Patient fatigue Vital Signs: Therapy Vitals Pulse Rate: 55 BP: 109/61 mmHg Patient Position, if appropriate: Sitting Oxygen Therapy SpO2: 98 % O2 Device: None (Room air) Pain: Pain Assessment Pain Assessment: No/denies pain  See FIM for current functional status  Therapy/Group: Co-Treatment w/ SLP  Denzil Hughes 02/18/2013, 12:10 PM

## 2013-02-18 NOTE — Plan of Care (Signed)
Problem: RH BOWEL ELIMINATION Goal: RH STG MANAGE BOWEL WITH ASSISTANCE STG Manage Bowel with Mod Assistance.  Outcome: Not Progressing Pt awaiting placement for [facility]

## 2013-02-18 NOTE — Progress Notes (Signed)
This note has been reviewed and this clinician agrees with information provided.  

## 2013-02-18 NOTE — Discharge Summary (Signed)
Physician Discharge Summary  Patient ID: Shawn Chen MRN: [id number] DOB/AGE: February 06, 1927 79 y.o.  Admit date: 02/11/2013 Discharge date: 02/19/2013  Discharge Diagnoses:  Principal Problem:   SDH (subdural hematoma) Active Problems:   Esophageal reflux   RA (rheumatoid arthritis)   Multiple myeloma, without mention of having achieved remission(203.00)   Encephalopathy acute   Seizure   Post-ictal state   Discharged Condition:  Impending death.   Significant Diagnostic Studies: Dg Chest 2 View  02/15/2013   *RADIOLOGY REPORT*  Clinical Data: Shortness of breath.  CHEST - 2 VIEW  Comparison: 02/08/2013  Findings: There is stable chronic lung disease/COPD.  No evidence of pulmonary edema, airspace consolidation or visible pleural fluid.  Stable mild cardiomegaly noted.  There is a stable moderate sized hiatal hernia.  IMPRESSION: Stable COPD and moderate sized hiatal hernia.  No acute findings.   Original Report Authenticated By: Irish Lack, M.D.   Dg Chest 2 View  02/08/2013   *RADIOLOGY REPORT*  Clinical Data: Generalized weakness.  Admission.  Current history multiple myeloma.  CHEST - 2 VIEW  Comparison: One-view chest x-ray 01/22/2013, 01/16/2013, 11/24/2012, 01/17/2010.  Findings: Cardiac silhouette moderately enlarged but stable.  Small to moderate sized hiatal hernia, unchanged.  Hilar and mediastinal contours otherwise unremarkable.  Pulmonary vascularity normal. Emphysematous changes in the upper lobes.  Probable calcified granuloma or hamartoma in the right mid lung on the PA image, likely the right middle lobe, unchanged since the June, 2014 examination.  No new pulmonary parenchymal abnormalities.  No pleural effusions.  Degenerative changes involving the thoracic spine and generalized osseous demineralization.  Old compression fracture of T12.  IMPRESSION: COPD/emphysema.  No acute cardiopulmonary disease.  Stable cardiomegaly.  Stable hiatal hernia.  Old compression  fracture of T12.   Original Report Authenticated By: Hulan Saas, M.D.   Ct Head Wo Contrast  02/14/2013   *RADIOLOGY REPORT*  Clinical Data: Subdural hematoma.  Somnolence.  CT HEAD WITHOUT CONTRAST  Technique:  Contiguous axial images were obtained from the base of the skull through the vertex without contrast.  Comparison: CT 02/12/2013  Findings: Right craniotomy for subdural drainage.  Mixed density subdural hematoma is unchanged.  The high density component measures up to 18 mm, unchanged.  There is also significant intermediate to low density component to the hematoma.  Mass effect on the right cerebral hemisphere is similar.  5 mm midline shift to the left is unchanged.  There is generalized atrophy.  Chronic microvascular ischemic change in the white matter.  No acute ischemic infarct.  IMPRESSION: Moderate to large mixed density subdural hematoma on the right is unchanged.  5 mm midline shift unchanged.   Original Report Authenticated By: [name], M.D.   Ct Head Wo Contrast  02/12/2013   *RADIOLOGY REPORT*  Clinical Data: Increasing lethargy. status post craniotomy for subdural hematoma, prior stroke, history of multiple myeloma  CT HEAD WITHOUT CONTRAST  Technique:  Contiguous axial images were obtained from the base of the skull through the vertex without contrast.  Comparison: 02/09/2013  Findings: Status post right frontal craniotomy.  Overlying skin staples.  Interval removal of prior subdural drain.  Underlying mixed density subdural hemorrhage measuring up to 1.8 cm in thickness, previously 1.7 cm.  Associated mild pneumocephalus.  Associated mass effect with 5 mm leftward midline shift, unchanged. Basal cisterns are patent.  No evidence of parenchymal or interventricular hemorrhage.  Subcortical white matter and periventricular small vessel ischemic changes.  Global cortical atrophy with stable mild ventricular  prominence.  The visualized paranasal sinuses are essentially clear. The  mastoid air cells are unopacified.  No evidence of calvarial fracture.  IMPRESSION: Status post right frontal craniotomy.  Interval removal of prior subdural drain.  Underlying mixed density right subdural hemorrhage measuring up to 1.8 cm in thickness, previously 1.7 cm.  Associated mass effect with 5 mm leftward midline shift, unchanged.   Original Report Authenticated By: Charline Bills, M.D.     Labs:  Basic Metabolic Panel:  Recent Labs Lab 02/13/13 0450 02/14/13 1020 02/15/13 0525 02/15/13 1420 02/16/13 0517  NA 130* 132* 128* 131* 130*  K 4.0 3.7 3.7 3.9 3.9  CL 100 102 101 101 102  CO2 16* 19 15* 16* 14*  GLUCOSE 102* 101* 87 89 90  BUN 18 18 15 14 14   CREATININE 0.71 0.79 0.63 0.67 0.65  CALCIUM 8.2* 7.9* 7.6* 7.9* 7.6*    CBC:  Recent Labs Lab 02/13/13 0450 02/14/13 1020 02/15/13 1420  WBC 6.8 6.0 6.3  HGB 12.1* 11.5* 12.6*  HCT 34.1* 32.4* 35.9*  MCV 90.7 91.0 91.3  PLT 284 273 231    CBG: No results found for this basename: GLUCAP,  in the last 168 hours  Brief HPI:   Shawn Chen is an 79 year old male with history of RA, Myeloma, small SDH dx 6/14. Patient with history of N/V and severe fecal impaction as well as recent fall. He was admitted on 02/08/13 with inability to walk and left sided weakness due to large R frontoparietal chronic SDH with significant mass effect and right to left shift. He was evaluated by Dr. Lovell Sheehan and underwent right crani for evacuation on the same day. Post op placed on Keppra for seizure prophylaxis. Dr Myna Hidalgo consulted for input and felt that bleed not due to underlying coagulopathy. Therapies initiated and patient limited by intermittent lethargy as well as problems with sequencing. CIR recommended by therapy team.    Hospital Course: Shawn Chen was admitted to rehab 02/11/2013 for inpatient therapies to consist of PT, ST and OT at least three hours five days a week. Past admission physiatrist, therapy team and  rehab RN have worked together to provide customized collaborative inpatient rehab. On am of 02/12/13, he was noted to be lethargic and required +2 assist to sit at EOB. Repeat CT head was done and showed no new changes. CBC without leucocytosis but urine was positive for pseudomonas and he was started on cipro for treatment. His po intake has been extremely poor in part due to fluctuating bouts of lethargy with inability to maintain wakefulness for long periods of time.  He has had poor participation/tolerance of therapies.  He had further decline on 08/30 and was made NPO. Repeat CT head showed no change in SDH.  Keppra was changed to depakote to see if this would help with mentation but has been ineffective. He was kept NPO due to fluctuating bouts of lethargy. He did rally briefly on 09/03 and ST recommended initiating dysphagia 1 diet with nectar thick liquids when patient was alert and ice chips past oral care.  Dr. [name], Dr. Ladona Ridgel and Dr. Myna Hidalgo were contacted to give input on FTT and goals of therapy. Family has elected to pursue residential hospice with focus on full comfort for end of life care. Patient was made DNR.  Bed [facility] available at [facility]  on 02/19/13   Rehab course: During patient's stay in rehab weekly team conferences were held to monitor patient's progress,  set goals and discuss barriers to discharge. Patient has been limited by fatigue and lethargy with difficulty maintaining wakefulness. He requires frequent cues to participate and is total assist for self care and all mobility.     Disposition:  Residential Hospice.   Diet: As tolerated for comfort.     Medication List    STOP taking these medications       alendronate 70 MG tablet  Commonly known as:  FOSAMAX     bisacodyl 10 MG suppository  Commonly known as:  DULCOLAX     citalopram 10 MG tablet  Commonly known as:  CELEXA     folic acid 1 MG tablet  Commonly known as:  FOLVITE     loperamide 2 MG capsule   Commonly known as:  IMODIUM     magnesium hydroxide 400 MG/5ML suspension  Commonly known as:  MILK OF MAGNESIA     megestrol 400 MG/10ML suspension  Commonly known as:  MEGACE     metoprolol succinate 50 MG 24 hr tablet  Commonly known as:  TOPROL-XL     multivitamin with minerals Tabs tablet     ondansetron 8 MG disintegrating tablet  Commonly known as:  ZOFRAN-ODT     pantoprazole 20 MG tablet  Commonly known as:  PROTONIX     polyethylene glycol packet  Commonly known as:  MIRALAX / GLYCOLAX     protein supplement Powd     RA SALINE ENEMA 19-7 GM/118ML Enem     simvastatin 40 MG tablet  Commonly known as:  ZOCOR     tamsulosin 0.4 MG Caps capsule  Commonly known as:  FLOMAX      TAKE these medications       ALPRAZolam 0.25 MG tablet  Commonly known as:  XANAX  Take one tablet by mouth every 8 hours as needed     chlorhexidine 0.12 % solution  Commonly known as:  PERIDEX  Use as directed 15 mLs in the mouth or throat 4 (four) times daily.     dextrose 5 % SOLN 50 mL with valproate 500 MG/5ML SOLN 250 mg  Inject 250 mg into the vein every 12 (twelve) hours.     feeding supplement Liqd  Take 237 mLs by mouth 2 (two) times daily between meals.         SignedJacquelynn Cree 02/19/2013, 10:26 AM

## 2013-02-18 NOTE — Consult Note (Signed)
#   [id number] is consult note.  He really has declined over the past month.  I believe the primary goal now should be comfort and quality of life. I believe that hospice would be helpful for him.  I don't believe that him having myeloma or myelodysplasia will be a problem. He does not have any hyper viscosity from his myeloma that would exacerbate neurological issues. He's not hyper calcemic. His kidney function is okay.  I believe that he just has had so much going on the past month that his body just does not have any reserve left.  I will send off a pre-albumin level as I find this is often fairly accurate with respect to prognosis.  I know that he is trying his best but I just don't see that he has the reserves to be able to improve.   Pete E.

## 2013-02-18 NOTE — Progress Notes (Signed)
Physical Therapy Discharge Summary  Patient Details  Name: Shawn Chen MRN: [id number] Date of Birth: [birth year]  Today's Date: 02/18/2013 Time: 1300-1350 Time Calculation (min): 50 min  Patient has met 0 of 14 long term goals due to family's desires to discontinue rehabilitative services at this time to pursue comfort care.  Patient to discharge at overall level of Total Assist +2 for basic functional mobility. Patient's care partner is unable to provide the necessary physical and cognitive assistance at discharge. Family's plan is to discharge to a hospice facility.  Reasons goals not met: due to family's desire to provide comfort care and not rehabilitative services.   Recommendation:  No f/u PT at this time  Equipment: No equipment provided  Reasons for discharge: discharge from hospital  Patient/family agrees with progress made and goals achieved: Yes  PT Discharge Precautions/Restrictions  Precautions  Precautions: Fall  Restrictions  Weight Bearing Restrictions: No  Vision/Perception  Vision - History  Baseline Vision: No visual deficits  Perception  Perception: (Significant lean to R side)  Praxis  Praxis: Impaired  Cognition  Overall Cognitive Status: Impaired/Different from baseline  Arousal/Alertness: Lethargic  Orientation Level: Oriented X4  Attention: Focused;Sustained;Selective;Alternating;Divided  Focused Attention: Appears intact  Sustained Attention: Impaired  Sustained Attention Impairment: Verbal basic;Functional basic  Selective Attention: Impaired  Selective Attention Impairment: Verbal basic;Functional basic  Memory: Impaired  Memory Impairment: Decreased recall of new information;Decreased short term memory  Decreased Short Term Memory: Verbal basic  Awareness: Impaired  Awareness Impairment: Intellectual impairment;Emergent impairment  Problem Solving: Impaired  Problem Solving Impairment: Functional basic  Executive Function:  Initiating;Sequencing  Sequencing: Impaired  Sequencing Impairment: Functional basic  Initiating: Impaired  Initiating Impairment: Functional basic;Verbal basic  Safety/Judgment: Impaired  Sensation  Sensation  Light Touch: Impaired by gross assessment (L UE/LE appear to be hyposensative)  Stereognosis: Not tested  Hot/Cold: Not tested  Proprioception: Impaired by gross assessment  Additional Comments: Significant lean to R side; Pt stating that he felt like he was leaning to the L side  Coordination  Gross Motor Movements are Fluid and Coordinated: No  Fine Motor Movements are Fluid and Coordinated: No  Coordination and Movement Description: Impaired by tone, decreased initiation and decreased proprioception  Motor  Motor  Motor: Hemiplegia  Motor - Skilled Clinical Observations: L hemiplegia vs. decrease awareness of L side; increased tone noted in L UE/LE; decreased coordination in R UE/LE  Mobility  Bed Mobility  Bed Mobility: Rolling Right;Rolling Left;Supine to Sit;Sit to Supine  Rolling Right: 1: +1 Total assist  Rolling Right Details: Tactile cues for placement;Verbal cues for precautions/safety;Verbal cues for sequencing;Tactile cues for initiation  Rolling Left: 2: Max assist  Rolling Left Details: Tactile cues for initiation;Verbal cues for sequencing;Verbal cues for precautions/safety;Tactile cues for placement  Rolling Left Details (indicate cue type and reason): Pt req max A x1person to bend L LE into hooklying position 2/2 tone  Supine to Sit: 1: +1 Total assist  Supine to Sit Details: Tactile cues for initiation;Verbal cues for sequencing;Verbal cues for precautions/safety;Tactile cues for placement  Sitting - Scoot to Edge of Bed: 1: +1 Total assist  Sitting - Scoot to Edge of Bed Details: Tactile cues for posture;Tactile cues for sequencing;Verbal cues for sequencing  Sitting - Scoot to Edge of Bed Details (indicate cue type and reason): Use of chux  Sit to Supine:  1: +2 Total assist  Transfers  Sit to Stand: With upper extremity assist;Other (comment) (HHA x2persons, 3rd person assist with trunk/bottom)  Sit to Stand Details: Verbal cues for sequencing;Tactile cues for sequencing  Stand to Sit: Other (comment);With upper extremity assist (Total assist +3)  Locomotion  Ambulation  Ambulation: No (Unsafe/unable to attempt at this time 2/2 lethargy and BP )  Wheelchair Mobility  Wheelchair Mobility: No (Unsafe/unable to attempt at this time 2/2 lethargy and BP )  Trunk/Postural Assessment  Cervical Assessment  Cervical Assessment: Exceptions to [facility] (forward head posture)  Thoracic Assessment  Thoracic Assessment: Exceptions to [facility] (kyphotic)  Postural Control  Postural Control: Deficits on evaluation  Righting Reactions: delayed/absent, significant pushing to R side  Balance  Balance  Balance Assessed: Yes  Static Sitting Balance  Static Sitting - Balance Support: Right upper extremity supported;Feet supported  Static Sitting - Level of Assistance: 1: +1 Total assist  Static Sitting - Comment/# of Minutes: Significant lean to R side  Dynamic Sitting Balance  Dynamic Sitting - Comments: static and dynamic sitting balance are poor- unable to attain/sustain without constant assist.  Static Standing Balance  Static Standing - Balance Support: Bilateral upper extremity supported  Static Standing - Level of Assistance: Other (comment) (Total assist +3)  Static Standing - Comment/# of Minutes: Kyphotic posture, req cues to improve posture, unable to achieve full upright posture.  Extremity Assessment  RLE Assessment  RLE Assessment: Within Functional Limits (Fair-good functional strength)  LLE Assessment  LLE Assessment: Exceptions to [facility] (Unable to determine gross strength 2/2 increased tone in L LE)  See FIM for current functional status  Denzil Hughes 02/18/2013, 4:45 PM

## 2013-02-18 NOTE — Consult Note (Addendum)
PCP:   [name], MD   Requesting MD: Faith Rogue, MD Reason for consult: Failure to thrive after SDH-address goals of therapy   HPI: Mr. Talent is an 79 year old white male with a history of rheumatoid arthritis, multiple myeloma, and recent subdural hematoma status post craniotomy who was admitted to [facility] on 8/27.   The patient has had a declining functional status over the past 6-9 months associated with significant weight loss and generalized weakness with recurrent falls.  In evaluating these issues further he was found to have multiple myeloma.  He did present with psychosis following a fall in 6/14 and was found to have a subdural hematoma. Repeat imaging at that time showed improvement in the subdural hematoma. This was treated nonoperatively associated with initial clinical improvement in his mental status.  He was subsequently treated with a brief course of chemotherapy for his MM which was discontinued due to his increasing weakness.  The patient has been hospitalized on 3 occasions over the past month-initially from 8/1 through 8/3 for fecal impaction.  He was readmitted from 8/7 through 8/11 for failure to thrive.  He was discharged to a skilled nursing facility for rehabilitation. On 8/24 he was readmitted to the neurosurgery service after presenting with left hemiparesis and inability to walk. Head CT at that time showed a large right frontoparietal chronic subdural hematoma 2.9 cm in thickness causing significant mass effect with 7 mm of right-to-left midline shift. He underwent emergent craniotomy by Dr. Lovell Sheehan.  Postoperatively, he did improve and was felt to be a candidate for inpatient rehabilitation. He was transferred to rehabilitation on 8/27. Since admission to the rehabilitation facility has experienced a seizure.  Head CT at that time showed no significant change in moderate to large mixed density subdural hematoma on the right. He has been  treated with Keppra with no apparent recurrent seizure activity. Despite attempts with therapy has failed to progress and remains very lethargic. He has been unable to eat do to his lethargy and has remained n.p.o. Due to his failure to thrive, Dr. Hermelinda Medicus has called Dr. Myna Hidalgo and myself to assist with goals of care.  Palliative Care has also been consulted.    Review of Systems:  Review of Systems - Limited by the patient's mental state but he does decline headache or pain. Past Medical History: Past Medical History  Diagnosis Date  . Diverticulosis of colon (without mention of hemorrhage)   . Esophagitis, unspecified   . 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 Personal history of colonic polyps   . Reflux   . Hernia   . RA (rheumatoid arthritis)   . Myeloma   . Multiple myeloma, without mention of having achieved remission(203.00) 01/05/2013  . Subdural hematoma 6/14    secondary to fall   Past Surgical History  Procedure Laterality Date  . Inguinal hernia repair  03/28/1990    dr price  . Orif hip fracture    . Craniotomy Right 02/08/2013    Procedure: Right CRANIOTOMY for HEMATOMA EVACUATION SUBDURAL;  Surgeon: Cristi Loron, MD;  Location: [facility];  Service: Neurosurgery;  Laterality: Right;    Medications: Prior to Admission medications   Medication Sig Start Date End Date Taking? Authorizing Provider  alendronate (FOSAMAX) 70 MG tablet Take 70 mg by mouth every 7 (seven) days. Take with a full glass of water on an empty stomach.   Yes Historical Provider, MD  bisacodyl (DULCOLAX) 10 MG suppository Place  10 mg rectally daily as needed for constipation.   Yes Historical Provider, MD  citalopram (CELEXA) 10 MG tablet Take 10 mg by mouth daily.  11/12/12  Yes Historical Provider, MD  folic acid (FOLVITE) 1 MG tablet Take 2 tablets (2 mg total) by mouth daily. 01/26/13  Yes W Buren Kos, MD  loperamide (IMODIUM) 2 MG capsule Take 1 capsule (2 mg total) by  mouth as needed for diarrhea or loose stools. 01/26/13  Yes W Buren Kos, MD  magnesium hydroxide (MILK OF MAGNESIA) 400 MG/5ML suspension Take 30 mLs by mouth daily as needed for constipation.   Yes Historical Provider, MD  megestrol (MEGACE) 400 MG/10ML suspension Take 10 mLs (400 mg total) by mouth daily. 01/26/13  Yes W Buren Kos, MD  metoprolol succinate (TOPROL-XL) 50 MG 24 hr tablet Take 25 mg by mouth every morning. Take with or immediately following a meal.   Yes Historical Provider, MD  Multiple Vitamin (MULTIVITAMIN WITH MINERALS) TABS Take 1 tablet by mouth daily.   Yes Historical Provider, MD  pantoprazole (PROTONIX) 20 MG tablet Take 20 mg by mouth daily.   Yes Historical Provider, MD  polyethylene glycol (MIRALAX / GLYCOLAX) packet Take 17 g by mouth daily. 01/18/13  Yes [name], MD  simvastatin (ZOCOR) 40 MG tablet Take 40 mg by mouth daily.     Yes Historical Provider, MD  Sodium Phosphates (RA SALINE ENEMA) 19-7 GM/118ML ENEM Place 1 Bottle rectally daily as needed (for constipation).   Yes Historical Provider, MD  tamsulosin (FLOMAX) 0.4 MG CAPS Take 0.4 mg by mouth daily.   Yes Historical Provider, MD  ALPRAZolam Prudy Feeler) 0.25 MG tablet Take one tablet by mouth every 8 hours as needed 02/09/13   Claudie Revering, NP  feeding supplement (ENSURE COMPLETE) LIQD Take 237 mLs by mouth 2 (two) times daily between meals. 01/26/13   [name], MD  ondansetron (ZOFRAN-ODT) 8 MG disintegrating tablet Take 1 tablet (8 mg total) by mouth every 12 (twelve) hours as needed for nausea. 01/16/13   [name]. [name], MD  protein supplement (RESOURCE BENEPROTEIN) POWD Take 1 scoop by mouth 2 (two) times daily.    Historical Provider, MD    Current Medications: . amantadine  100 mg Oral Daily  . amLODipine  5 mg Oral Daily  . antiseptic oral rinse  15 mL Mouth Rinse TID WC & HS  . atorvastatin  20 mg Oral q1800  . capsicum   Topical TID  . chlorhexidine  15 mL Mouth Rinse QID  .  ciprofloxacin  200 mg Intravenous Q12H  . feeding supplement  237 mL Oral BID BM  . feeding supplement  30 mL Oral Daily  . fluticasone  1 spray Each Nare Daily  . folic acid  2 mg Oral Daily  . ipratropium  0.5 mg Nebulization Q6H  . levalbuterol  0.63 mg Nebulization Q6H  . metoprolol  5 mg Intravenous Q8H  . multivitamin with minerals  1 tablet Oral Daily  . pantoprazole  20 mg Oral Daily  . polyethylene glycol  17 g Oral BID  . valACYclovir  1,000 mg Oral Daily  . valproate sodium  250 mg Intravenous Q12H   Allergies:  No Known Allergies  Social History:  reports that he quit smoking about 22 years ago. He has never used smokeless tobacco. He reports that he does not drink alcohol or use illicit drugs.  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 Colon cancer Neg Hx     Physical Exam: Filed Vitals:   02/17/13 1530 02/17/13 2028 02/17/13 2159 02/18/13 0501  BP: 160/80  145/62 153/69  Pulse: 70  52 61  Temp: 97.5 F (36.4 C)   99.5 F (37.5 C)  TempSrc: Oral   Oral  Resp: 18   18  Height:      Weight:      SpO2: 89% 98%  97%   General appearance: Lethargic with minimal eye contact. Will slowly follow commands Head: Normocephalic, without obvious abnormality, atraumatic Eyes: conjunctivae/corneas clear. PERRL, EOM's intact.  Nose: Nares normal. Septum midline. Mucosa normal. No drainage or sinus tenderness. Throat: lips, mucosa, and tongue normal; teeth and gums normal Neck: no adenopathy, no carotid bruit, no JVD and thyroid not enlarged, symmetric, no tenderness/mass/nodules Resp: Relatively clear Cardio: regular rate and rhythm GI: soft, non-tender; bowel sounds normal; no masses,  no organomegaly Extremities: extremities normal, atraumatic, no cyanosis or edema Pulses: 2+ and symmetric Lymph nodes: Cervical adenopathy: no cervical lymphadenopathy Neurologic: Left hemiparesis (2/5 strength in left arm and leg),  no clonus    Labs on Admission:   Recent Labs  02/15/13 1420 02/16/13 0517  NA 131* 130*  K 3.9 3.9  CL 101 102  CO2 16* 14*  GLUCOSE 89 90  BUN 14 14  CREATININE 0.67 0.65  CALCIUM 7.9* 7.6*    Recent Labs  02/15/13 1420  WBC 6.3  HGB 12.6*  HCT 35.9*  MCV 91.3  PLT 231    Recent Labs  02/15/13 1420 02/16/13 1100  CKTOTAL 84 76  CKMB  --  3.5  TROPONINI <0.30 <0.30   Lab Results  Component Value Date   INR 1.15 02/08/2013   INR 1.25 01/22/2013   INR 1.27 01/21/2010    Radiological Exams on Admission: Dg Chest 2 View  02/15/2013   *RADIOLOGY REPORT*  Clinical Data: Shortness of breath.  CHEST - 2 VIEW  Comparison: 02/08/2013  Findings: There is stable chronic lung disease/COPD.  No evidence of pulmonary edema, airspace consolidation or visible pleural fluid.  Stable mild cardiomegaly noted.  There is a stable moderate sized hiatal hernia.  IMPRESSION: Stable COPD and moderate sized hiatal hernia.  No acute findings.   Original Report Authenticated By: Irish Lack, M.D.   Dg Chest 2 View  02/08/2013   *RADIOLOGY REPORT*  Clinical Data: Generalized weakness.  Admission.  Current history multiple myeloma.  CHEST - 2 VIEW  Comparison: One-view chest x-ray 01/22/2013, 01/16/2013, 11/24/2012, 01/17/2010.  Findings: Cardiac silhouette moderately enlarged but stable.  Small to moderate sized hiatal hernia, unchanged.  Hilar and mediastinal contours otherwise unremarkable.  Pulmonary vascularity normal. Emphysematous changes in the upper lobes.  Probable calcified granuloma or hamartoma in the right mid lung on the PA image, likely the right middle lobe, unchanged since the June, 2014 examination.  No new pulmonary parenchymal abnormalities.  No pleural effusions.  Degenerative changes involving the thoracic spine and generalized osseous demineralization.  Old compression fracture of T12.  IMPRESSION: COPD/emphysema.  No acute cardiopulmonary disease.  Stable cardiomegaly.   Stable hiatal hernia.  Old compression fracture of T12.   Original Report Authenticated By: Hulan Saas, M.D.   Ct Head Wo Contrast  02/14/2013   *RADIOLOGY REPORT*  Clinical Data: Subdural hematoma.  Somnolence.  CT HEAD WITHOUT CONTRAST  Technique:  Contiguous axial images were obtained from the base of the skull through the vertex without contrast.  Comparison: CT 02/12/2013  Findings: Right craniotomy for subdural drainage.  Mixed density subdural hematoma is unchanged.  The high density component measures up to 18 mm, unchanged.  There is also significant intermediate to low density component to the hematoma.  Mass effect on the right cerebral hemisphere is similar.  5 mm midline shift to the left is unchanged.  There is generalized atrophy.  Chronic microvascular ischemic change in the white matter.  No acute ischemic infarct.  IMPRESSION: Moderate to large mixed density subdural hematoma on the right is unchanged.  5 mm midline shift unchanged.   Original Report Authenticated By: [name], M.D.   Ct Head Wo Contrast  02/12/2013   *RADIOLOGY REPORT*  Clinical Data: Increasing lethargy. status post craniotomy for subdural hematoma, prior stroke, history of multiple myeloma  CT HEAD WITHOUT CONTRAST  Technique:  Contiguous axial images were obtained from the base of the skull through the vertex without contrast.  Comparison: 02/09/2013  Findings: Status post right frontal craniotomy.  Overlying skin staples.  Interval removal of prior subdural drain.  Underlying mixed density subdural hemorrhage measuring up to 1.8 cm in thickness, previously 1.7 cm.  Associated mild pneumocephalus.  Associated mass effect with 5 mm leftward midline shift, unchanged. Basal cisterns are patent.  No evidence of parenchymal or interventricular hemorrhage.  Subcortical white matter and periventricular small vessel ischemic changes.  Global cortical atrophy with stable mild ventricular prominence.  The visualized  paranasal sinuses are essentially clear. The mastoid air cells are unopacified.  No evidence of calvarial fracture.  IMPRESSION: Status post right frontal craniotomy.  Interval removal of prior subdural drain.  Underlying mixed density right subdural hemorrhage measuring up to 1.8 cm in thickness, previously 1.7 cm.  Associated mass effect with 5 mm leftward midline shift, unchanged.   Original Report Authenticated By: Charline Bills, M.D.   Ct Head Wo Contrast  02/09/2013   CLINICAL DATA:  79 year old male status post craniotomy. Right subdural hematoma with recent increased weakness.  EXAM: CT HEAD WITHOUT CONTRAST  TECHNIQUE: Contiguous axial images were obtained from the base of the skull through the vertex without intravenous contrast.  COMPARISON:  02/08/2013 and earlier.  FINDINGS: Small right convexity craniotomy has been performed with right subdural drain placed. Postoperative changes to the skull and overlying soft tissues. Small volume pneumocephalus.  Elsewhere osseous structures are stable. Stable paranasal sinuses and mastoids. Negative orbits soft tissues.  Cavum septum pellucidum again incidentally noted. Extra-axial collection on the right has decreased in thickness, now up to 13 -17 mm, but appears somewhat more generalized. Density within the collection now is more heterogeneous.  Mass effect on the right hemisphere has decreased. Leftward midline shift is decreased to 5 mm (previously 7 mm). No intra-axial or intraventricular hemorrhage.  Stable gray-white matter differentiation throughout the brain. No ventriculomegaly. No evidence of cortically based acute infarction identified. Calcified atherosclerosis at the skull base. No suspicious intracranial vascular hyperdensity.  IMPRESSION: 1. Mixed density right subdural hematoma with decreased mass effect on the right hemisphere and decreased midline shift (now 5 mm) following right subdural drain placement. Residual collection is up to 17  mm. Small volume pneumocephalus.  2. No new intracranial abnormality.   Electronically Signed   By: Augusto Gamble   On: 02/09/2013 08:07   Ct Head Wo Contrast  02/08/2013   *RADIOLOGY REPORT*  Clinical Data:  Increased weakness, tremors, history multiple myeloma, melanoma, left side weakness since Tuesday  CT HEAD WITHOUT CONTRAST  Technique:  Contiguous  axial images were obtained from the base of the skull through the vertex without contrast.  Comparison: 11/22/2012  Findings: Generalized atrophy. Large low attenuation right frontoparietal subdural collection measuring up to 2.9 cm thick consistent with a chronic subdural hematoma. Significant mass effect upon right hemisphere with sulcal effacement, hemisphere compression, and 7 mm of right-to-left midline shift. Small vessel chronic ischemic changes of deep cerebral white matter. No definite intracranial hemorrhage, mass, or evidence of acute infarction. No high attenuation component to the above the subdural collection is identified. Atherosclerotic calcifications at skull base. Bones and sinuses unremarkable. Small bilateral mastoid effusions.  IMPRESSION: Large right frontoparietal chronic subdural hematoma 2.9 cm in thickness causing significant mass effect upon the right hemisphere and 7 mm of right-to-left midline shift.  Critical Value/emergent results were called by telephone at the time of interpretation on 02/08/2013 at 1400 hours to V. Pickering, who verbally acknowledged these results.   Original Report Authenticated By: Ulyses Southward, M.D.    Orders placed during the hospital encounter of 02/11/13  . EKG 12-LEAD  . EKG 12-LEAD  . EKG 12-LEAD  . EKG 12-LEAD    Assessment/Plan 1. Failure to Thrive- his functional decline preceded his fall and subdural hematoma in 6/14 and subsequent brief treatment for multiple myeloma and has clearly progressed after his most recent subdural hematoma. Multifactorial decline secondary to CNS effects from  subdural hematoma, metabolic encephalopathy, severe protein calorie malnutrition. I do not think that multiple myeloma is contributing significantly at this point. I think it is appropriate to pursue hospice comfort care.  May be a candidate for [facility].  I will discuss with his wife later today.  Agree with Palliative Care consult as requested by Dr. [name].  2. SDH (subdural hematoma)- s/p craniotomy with persistent left hemiparesis with minimal improvement. 3.  Multiple myeloma, without mention of having achieved remission(203.00)- blood counts are actually fairly stable. Doubt that this is contributing significantly. 4. Seizure-on Keppra following recent seizure in the setting of his subdural hematoma 5. Severe protein calorie malnutrition-I do not think that it is appropriate to proceed with PEG tube feeds. I believe comfort care is more appropriate.   SHAW,W DOUGLAS 02/18/2013, 6:48 AM  Discussed with wife [name]- she understands that his condition has progressed to the point that hospice is appropriate. She has had discussions with him in the past and he indicated that he would want to have hospice rather than to prolong his life if no chance for significant recovery. She indicated that he would not want to be resuscitated. We'll proceed with palliative care consult to assist with possible transfer to [facility].

## 2013-02-18 NOTE — Progress Notes (Signed)
Occupational Therapy Session Note  Patient Details  Name: Shawn Chen MRN: [id number] Date of Birth: 1927/03/06  Today's Date: 02/18/2013 Time: 1300-1350 Time Calculation: 50 minutes  Short Term Goals: Week 1:  OT Short Term Goal 1 (Week 1): Pt will maintain dynamic sitting balance with mod A in preparation with ADL tasks. OT Short Term Goal 2 (Week 1): Pt will bathe with max assist in supported sitting. OT Short Term Goal 3 (Week 1): Pt will demonstrate orientation x4 with min A.  OT Short Term Goal 4 (Week 1): Pt will demonstrate sustained attention for one grooming task with min verbal cues.   Skilled Therapeutic Interventions/Progress Updates:    Pt engaged in 1:1 session focused on bed mobility, functional transfer to w/c, alertness, and following simple commands.  Pt lethargic/fatigued throughout session, frequent cues to open eyes.  Pt incontinent of urine/bm upon entry to room, hygiene completed with total A.  Total A required for bed mobility and for slide-board transfer out of bed to w/c, less resistance to movement during transfer.  Increased alertness noted when pt positioned in w/c.  Simple sorting activity attempted in gym, pt unable to participate at this time, frequently fell asleep and unable to recall  the demands of the task.  Pt offered something to drink, made choice of beverage and HOH assist provided to drink from cup.  Pt in w/c visiting with wife at bedside at conclusion of session.  Therapy Documentation Precautions:  Precautions Precautions: Fall Restrictions Weight Bearing Restrictions: No General: General Missed Time Reason: Patient fatigue Vital Signs: Therapy Vitals Temp: 98.1 F (36.7 C) Temp src: Axillary Pulse Rate: 58 Resp: 16 BP: 124/75 mmHg Patient Position, if appropriate: Sitting Oxygen Therapy SpO2: 100 % O2 Device: None (Room air) Pain: Pain Assessment Pain Assessment: No/denies pain  See FIM for current functional  status  Therapy/Group: Individual Therapy  Kandis Ban 02/18/2013, 2:53 PM

## 2013-02-18 NOTE — Progress Notes (Signed)
Subjective/Complaints: No substantial change. Did perhaps a little better in the am yesterday.  A 12 point review of systems has been performed and if not noted above is otherwise negative.   Objective: Vital Signs: Blood pressure 153/69, pulse 61, temperature 99.5 F (37.5 C), temperature source Oral, resp. rate 18, height 5\' 10"  (1.778 m), weight 62.732 kg (138 lb 4.8 oz), SpO2 97.00%. No results found.  Recent Labs  02/15/13 1420  WBC 6.3  HGB 12.6*  HCT 35.9*  PLT 231    Recent Labs  02/15/13 1420 02/16/13 0517  NA 131* 130*  K 3.9 3.9  CL 101 102  GLUCOSE 89 90  BUN 14 14  CREATININE 0.67 0.65  CALCIUM 7.9* 7.6*   CBG (last 3)  No results found for this basename: GLUCAP,  in the last 72 hours  Wt Readings from Last 3 Encounters:  02/11/13 62.732 kg (138 lb 4.8 oz)  02/08/13 65.5 kg (144 lb 6.4 oz)  02/08/13 65.5 kg (144 lb 6.4 oz)    Physical Exam:  Constitutional: He appears well-developed and well-nourished. No distress.   HENT:  Head: Normocephalic.  Right crani incision clean Eyes: Conjunctivae and EOM are normal. Pupils are equal, round, and reactive to light. Right eye exhibits no discharge. Left eye exhibits no discharge. No scleral icterus.  Neck: Normal range of motion. Neck supple. No JVD present. No tracheal deviation present. No thyromegaly present.  Cardiovascular: Normal rate and regular rhythm. Exam reveals no gallop and no friction rub.  No murmur heard.  Pulmonary/Chest: Effort normal and breath sounds normal--NO respiratory distress. He has no wheezes. He has no rales. He exhibits no tenderness.  Abdominal: Soft. Bowel sounds are normal. He exhibits no distension and no mass. There is no tenderness. There is no rebound and no guarding.  Musculoskeletal: He exhibits no edema and no tenderness.  Lymphadenopathy:  He has no cervical adenopathy.  Neurological: He is alert. He exhibits normal muscle tone.  more alert.  He displays a left  central 7 and slurring of his speech, unchanged.  Some apraxia on left was  seen. LUE 2+ to 3- at deltoid, tricep, biceps, 2 to 2+with HI, wrist. Senses pain in left arm and leg. LLE is 3 to 3+ with HF, KE, ADF and APF. Right upper extremity and lower grossly 4/5.    Skin: Skin is warm and dry. He is not diaphoretic.    Assessment/Plan: 1. Functional deficits secondary to right fronto-parietal SDH which require 3+ hours per day of interdisciplinary therapy in a comprehensive inpatient rehab setting. Physiatrist is providing close team supervision and 24 hour management of active medical problems listed below. Physiatrist and rehab team continue to assess barriers to discharge/monitor patient progress toward functional and medical goals.  Had a lengthy discussion with Shawn Chen yesterday regarding her husband's prognosis, quality of life, and plan of care. I also spoke with Dr. Myna Hidalgo and Dr. [name] who were kind enough to come and see pt this AM. They have left notes and recs. Dr. [name] will speak with Shawn Chen as well today. At this point, I believe hospice is the most appropriate venue for further care for Shawn Chen. Therapies have been decreased to QD.      FIM: FIM - Bathing Bathing: 1: Total-Patient completes 0-2 of 10 parts or less than 25%  FIM - Upper Body Dressing/Undressing Upper body dressing/undressing: 1: Total-Patient completed less than 25% of tasks FIM - Lower Body Dressing/Undressing Lower body dressing/undressing: 1:  Two helpers  FIM - Toileting Toileting steps completed by patient: Adjust clothing prior to toileting;Performs perineal hygiene;Adjust clothing after toileting Toileting: 1: Total-Patient completed zero steps, helper did all 3  FIM - Diplomatic Services operational officer Devices: Bedside commode Toilet Transfers: 0-Activity did not occur  FIM - Banker Devices: Arm rests Bed/Chair Transfer: 1: Two  helpers;1: Mechanical lift  FIM - Locomotion: Wheelchair Locomotion: Wheelchair: 0: Activity did not occur FIM - Locomotion: Ambulation Locomotion: Ambulation: 0: Activity did not occur  Comprehension Comprehension Mode: Auditory Comprehension: 3-Understands basic 50 - 74% of the time/requires cueing 25 - 50%  of the time  Expression Expression Mode: Verbal Expression: 2-Expresses basic 25 - 49% of the time/requires cueing 50 - 75% of the time. Uses single words/gestures.  Social Interaction Social Interaction: 2-Interacts appropriately 25 - 49% of time - Needs frequent redirection.  Problem Solving Problem Solving: 1-Solves basic less than 25% of the time - needs direction nearly all the time or does not effectively solve problems and may need a restraint for safety  Memory Memory: 2-Recognizes or recalls 25 - 49% of the time/requires cueing 51 - 75% of the time  Medical Problem List and Plan:  1. DVT Prophylaxis/Anticoagulation: Mechanical: Sequential compression devices, below knee Bilateral lower extremities  2. Chronic back pain/Pain Management: Uses aleve and "salve" at home.  3. Mood:Continue celexa. LCSW to follow for evaluation.  4. Neuropsych: This patient is intermittently capable of making decisions on his own behalf.  6. Multiple Myeloma: recent diagnosis. Chemo on hold currently.  -may be worthwhile to have onc way in on MM/effects upon appearance   7. GERD: Continue protonix.   8. HTN: Will monitor with bid checks. Continue norvasc and metoprolol.  9. H/o severe constipation:  bowel program   10 Hyponatremia: continues around 130 11. Altered mental status/lethargy  -given SDH/midline shift/UTI/age fluctuations are to be expected and are occuring  -rx UTI- pseudomonas-----cipro started Friday and changed to IV--continue for now  -continue NPO/ follow up with SLP  -keppra changed to vpa 12. Perceived dyspnea---?cognitive/central  -a little better after xopenex,  atrovent  -abg not consistent with hypoxic or co2 retaining state 13. Dysphagia-  -continue NPO  LOS (Days) 7 A FACE TO FACE EVALUATION WAS PERFORMED  Shawn Chen T 02/18/2013 7:49 AM

## 2013-02-18 NOTE — Consult Note (Signed)
Patient Shawn Chen      DOB: 10-20-1926      HYQ:[id number]     Consult Note from the Palliative Medicine Team at [facility]    Consult Requested byDr. Hermelinda Medicus   PCP: [name], MD Reason for Consultation: A firm goals of care and related symptom recommendations    Phone Number:[phone]  Assessment of patients Current state: Patient is an 79 year old white male with a known history of multiple myeloma who was intolerant of chemotherapeutic treatment. Patient was found to have altered mental status and was found to have subdural hematoma therefore was admitted on August 24. He is status post evacuation of subdural hematoma with residual left-sided weakness. Patient was discharged and admitted to rehabilitation but continued to fail to thrive. His wife [name] has elected to pursue full comfort care if he continues to be intermittently somnolent and unable to really participate in his therapy. He's having difficulty with swallowing and therefore he would like to consider residential hospice placement.   Goals of Care: 1.  Code Status: DO NOT RESUSCITATE    2. Scope of Treatment: family would like to discontinue any unnecessary medications and provide comfort progressive symptom management only  4. Disposition:Residential hospice referral has been made  3. Symptom Management: Currently the patient has no significant symptoms other than somnolence. Will discontinue his oral medications including his antihypertensive  medications, supplements, oral antibiotics, and IV fluids. Patient will eat for comfort   4. Psychosocial: patient is married with 2 sons his daughter-in-law is a Engineer, civil (consulting) and is helping family make decisions   5. Spiritual: chaplaincy support was offered         Patient Documents Completed or Given: Document Given Completed  Advanced Directives Pkt    MOST    DNR    Gone from My Sight    Hard Choices      Brief HPI: Patient is an 79 year old white  male status post subdural hematoma evacuation. Patient continues to have failure to thrive with dysphagia, in the context of multiple myeloma with myelodysplasia. Family does not want her to artificial feeding and at this time was elected to pursue comfort care and would like to consider residential hospice and therefore we are consulted to confirm goals of care   Patient smiles and states he is not having any pain but cannot do further assistance because of altered mental status PMH:  Past Medical History  Diagnosis Date  . Diverticulosis of colon (without mention of hemorrhage)   . Esophagitis, unspecified   . 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 Personal history of colonic polyps   . Reflux   . Hernia   . RA (rheumatoid arthritis)   . Myeloma   . Multiple myeloma, without mention of having achieved remission(203.00) 01/05/2013  . Subdural hematoma 6/14    secondary to fall     PSH: Past Surgical History  Procedure Laterality Date  . Inguinal hernia repair  03/28/1990    dr price  . Orif hip fracture    . Craniotomy Right 02/08/2013    Procedure: Right CRANIOTOMY for HEMATOMA EVACUATION SUBDURAL;  Surgeon: Cristi Loron, MD;  Location: [facility];  Service: Neurosurgery;  Laterality: Right;   I have reviewed the FH and SH and  If appropriate update it with new information. No Known Allergies Scheduled Meds: . amantadine  100 mg Oral Daily  . amLODipine  5 mg Oral Daily  . antiseptic oral rinse  15 mL  Mouth Rinse TID WC & HS  . atorvastatin  20 mg Oral q1800  . capsicum   Topical TID  . chlorhexidine  15 mL Mouth Rinse QID  . ciprofloxacin  200 mg Intravenous Q12H  . feeding supplement  237 mL Oral BID BM  . feeding supplement  30 mL Oral Daily  . fluticasone  1 spray Each Nare Daily  . folic acid  2 mg Oral Daily  . ipratropium  0.5 mg Nebulization Q6H  . levalbuterol  0.63 mg Nebulization Q6H  . metoprolol  5 mg Intravenous Q8H  . multivitamin  with minerals  1 tablet Oral Daily  . pantoprazole  20 mg Oral Daily  . polyethylene glycol  17 g Oral BID  . valACYclovir  1,000 mg Oral Daily  . valproate sodium  250 mg Intravenous Q12H   Continuous Infusions: . sodium chloride 100 mL/hr at 02/18/13 0540  . dextrose 5 % and 0.9% NaCl 75 mL/hr at 02/18/13 1046   PRN Meds:.acetaminophen, ALPRAZolam, alum & mag hydroxide-simeth, bisacodyl, guaiFENesin-dextromethorphan, HYDROcodone-acetaminophen, ondansetron (ZOFRAN) IV, ondansetron, sodium phosphate, traZODone    BP 109/61  Pulse 55  Temp(Src) 99.5 F (37.5 C) (Oral)  Resp 18  Ht 5\' 10"  (1.778 m)  Wt 62.732 kg (138 lb 4.8 oz)  BMI 19.84 kg/m2  SpO2 98%   PPS: 20%   Intake/Output Summary (Last 24 hours) at 02/18/13 1424 Last data filed at 02/18/13 0500  Gross per 24 hour  Intake      0 ml  Output   1075 ml  Net  -1075 ml     Physical Exam:  General: intermittently somnolent, smiles with big blue eyes HEENT:  PERRL, EOMI, anicteric, mmm, speech slow and deliberate at time not clear Chest:   Decreased but clear no wheezing  CVS: regular rate and rhtyhm Abdomen: Soft nontender nondistended Ext: Wasted, generally weak Neuro: Intermittently somnolent will arouse speech slightly slurred  Labs: CBC    Component Value Date/Time   WBC 6.3 02/15/2013 1420   WBC 5.5 01/05/2013 1208   RBC 3.93* 02/15/2013 1420   RBC 3.06* 01/05/2013 1208   HGB 12.6* 02/15/2013 1420   HGB 10.1* 01/05/2013 1208   HCT 35.9* 02/15/2013 1420   HCT 30.2* 01/05/2013 1208   PLT 231 02/15/2013 1420   PLT 147 01/05/2013 1208   MCV 91.3 02/15/2013 1420   MCV 101* 01/05/2013 1208   MCH 32.1 02/15/2013 1420   MCH 33.7* 01/05/2013 1208   MCHC 35.1 02/15/2013 1420   MCHC 33.4 01/05/2013 1208   RDW 14.7 02/15/2013 1420   RDW 15.6 01/05/2013 1208   LYMPHSABS 2.1 02/12/2013 0559   LYMPHSABS 1.4 01/05/2013 1208   MONOABS 0.5 02/12/2013 0559   EOSABS 0.1 02/12/2013 0559   EOSABS 0.0 01/05/2013 1208   BASOSABS 0.0  02/12/2013 0559   BASOSABS 0.0 01/05/2013 1208     CMP     Component Value Date/Time   NA 130* 02/16/2013 0517   K 3.9 02/16/2013 0517   CL 102 02/16/2013 0517   CO2 14* 02/16/2013 0517   GLUCOSE 90 02/16/2013 0517   BUN 14 02/16/2013 0517   CREATININE 0.65 02/16/2013 0517   CALCIUM 7.6* 02/16/2013 0517   PROT 7.4 02/14/2013 1020   ALBUMIN 2.5* 02/14/2013 1020   AST 17 02/14/2013 1020   ALT 16 02/14/2013 1020   ALKPHOS 70 02/14/2013 1020   BILITOT 0.8 02/14/2013 1020   GFRNONAA 87* 02/16/2013 0517   GFRAA >90 02/16/2013 8295  Chest Xray Reviewed/Impressions: stable COPD, moderate hiatel hernia  CT scan of the Head Reviewed/Impressions:  Moderate to large mixed density subdural hematoma on the right is  unchanged. 5 mm midline shift unchanged.      Time In Time Out Total Time Spent with Patient Total Overall Time  130 pm 235  pm 65 min 65 min   Discussed with Dr. [name], Dr. [name], [name], Forrestine Him and Dr. Myna Hidalgo  Greater than 50%  of this time was spent counseling and coordinating care related to the above assessment and plan.  Ida Uppal L. Ladona Ridgel, MD MBA The Palliative Medicine Team at [facility] Phone: [phone] Pager: [phone]

## 2013-02-18 NOTE — Plan of Care (Signed)
Problem: RH BLADDER ELIMINATION Goal: RH STG MANAGE BLADDER WITH ASSISTANCE STG Manage Bladder With Mod Assistance  Outcome: Not Progressing Requires condom cath, managed by staff

## 2013-02-18 NOTE — Progress Notes (Signed)
Speech Language Pathology Daily Session Note  Patient Details  Name: Shawn Chen MRN: [id number] Date of Birth: 1926-10-08  Today's Date: 02/18/2013 Time: 1478-2956  Time Calculation (min): 30 min  Short Term Goals: Week 1: SLP Short Term Goal 1 (Week 1): Pt will consume current diet and uitlize swallowing compensatory strategies to minimize overt s/s of aspiration with Mod verbal and tactile cues.[name] SLP Short Term Goal 2 (Week 1): Pt will demonstrate efficient mastication of Dys. 2 textures without overt s/s of aspiration and Mod verbal cues.  SLP Short Term Goal 3 (Week 1): Pt will sustain attention to functional tasks for 5 minutes with Min A verbal cues for redirection SLP Short Term Goal 4 (Week 1): Pt will orient to time, place and situation with Min A verbal and visual cues. SLP Short Term Goal 5 (Week 1): Pt will utilize call bell to express wants/needs with Max A verbal and question cues.  SLP Short Term Goal 6 (Week 1): Pt will identify 1 physical and 1 cognitive deficit with Mod A semantic and question cues.   Skilled Therapeutic Interventions: Co-treatment with PT with focus on positioning and arousal for assessment of readiness for PO intake.  Pt initially presented with lethargy and difficulty keeping his eyes open, however, patient's arousal and overall alertness increased while sitting EOB. Oral care performed and pt consumed PO trials of multiple consistencies. Pt given ice chip trials without overt s/s of aspiration but demonstrated mild anterior spillage. Pt initially required Min verbal cues for appropriate lip seal around cup to decrease anterior spillage. Pt also given trials via straw. Pt demonstrated delayed coughing with thin liquids via cup and straw which appeared to increase with patient fatigue.  Pt given trials of nectar thick liquids via cup and puree textures without overt s/s of aspiration or anterior spillage. Recommend pt initiate nectar-thick liquids with Dys.  1 textures. Pt may also have intermittent ice chips between meals. Provided extensive education with staff and family in regards to current diet recommendations and goals of initiating diet for comfort vs. meeting nutritional needs due to lethargy and inability to consume adequate intake.  All verbalized understanding of need for patient to be awake and alert in proper positioning for PO intake.     FIM:  Comprehension Comprehension Mode: Auditory Comprehension: 3-Understands basic 50 - 74% of the time/requires cueing 25 - 50%  of the time Expression Expression Mode: Verbal Expression: 2-Expresses basic 25 - 49% of the time/requires cueing 50 - 75% of the time. Uses single words/gestures. Social Interaction Social Interaction: 2-Interacts appropriately 25 - 49% of time - Needs frequent redirection. Problem Solving Problem Solving: 1-Solves basic less than 25% of the time - needs direction nearly all the time or does not effectively solve problems and may need a restraint for safety Memory Memory: 2-Recognizes or recalls 25 - 49% of the time/requires cueing 51 - 75% of the time FIM - Eating Eating Activity: 4: Help with managing cup/glass  Pain Pain Assessment Pain Assessment: No/denies pain  Therapy/Group: Individual Therapy  Balen Woolum 02/18/2013, 3:06 PM

## 2013-02-18 NOTE — Consult Note (Addendum)
Patient AV:[id number] Shawn Chen      DOB: [birth year]      BJY:[id number]  Summary of Goals of Care, met with patient's spouse [name], and daughter in law [name].  Two sons are being kept informed through [place].   Spoke with Dr. Myna Hidalgo who feels patient has declined significantly and is supportive of Palliative Care consult and potential hospice referral .  Reviewed case with [name] on rehab and Dr. [name].  Patient has only had brief periods of interaction.  Mostly sleeping.  Will be more interactive when up in upright position.    Family would like to focus on full comfort.  They desire transition to residential hospice home to pursue dignity and comfort .  They understand that his treatments will no longer be curative and they do not want to pursue full rehabilitation but would appreciate restorative nursing to help promote dignity and allow the patient to do what he can do.  Currently, he has only been able to take IV medications.  I have reviewed his med with the Rehab team and will remove those medications which do not promote comfort.  We have activated a residential hospice referral.  Family understands that curative treatments will likely be discontinued in favor of comfort.  I will update Dr. [name]  Total time : 130 pm- 235 pm  [name], MD MBA The Palliative Medicine Team at [facility] Phone: [phone] Pager: [phone]   Addendum:  [name] for Hospice diagnosis: s/p SDH with FTT, hyponatremia which will worsen without IVF, dysphagia, inability to maintain wakefulness for long periods.  Albumin 2.5.  Not eating or taking pills.  H/o of multiple myeloma , myelodysplasia.  Confirmed no history of shingles, started on Valtrex with chemo.  Had shingles shot. Past develops decreased mentation when laying down, slightly better sitting up. Residential referral made.   [name], MD MBA The Palliative Medicine Team at [facility] Phone: [phone] Pager: [phone]

## 2013-02-18 NOTE — Consult Note (Signed)
Shawn Chen, Shawn Chen NO.:  [id number]  MEDICAL RECORD NO.:  [id number]  LOCATION:  [id number]                        FACILITY:  [facility]  PHYSICIAN:  Josph Macho, M.D.  DATE OF BIRTH:  May 05, 1927  DATE OF CONSULTATION:  02/18/2013 DATE OF DISCHARGE:                                CONSULTATION   REASON FOR CONSULTATION: 1. Subdural hematoma. 2. Failure to thrive. 3. Multiple myeloma/myelodysplasia.  HISTORY OF PRESENT ILLNESS:  Shawn Chen is an 79 year old white gentleman.  He has a history of multiple myeloma and myelodysplasia.  He tolerated treatment poorly.  We stopped his treatment probably about 6 weeks or so ago.  He had been over at [facility].  He seemed to get better from that.  He then was admitted on February 08, 2013, over at [facility] with subdural hematoma.  He had this evacuated.  He has residual left-sided issues.  He is brought to the rehab unit.  Unfortunately, his decline has continued.  He is very weak.  He is just not able to do any therapy.  We are asked to re-consult to see if we could make any recommendations.  On February 15, 2013, his lab work showed a white cell count of 6.3, hemoglobin 12.6, hematocrit 35.9, platelet count 231.  His calcium is 7.9, BUN of 14, creatinine is 0.67.  He is much weaker than when I last saw him.  He can barely talk.  His performance status, I would say, his ECOG 3-4.  I will send off a prealbumin on him.  He does have rheumatoid arthritis.  He has been on methotrexate for this.  He also has other health issues.  His past medical history is well documented.  ALLERGIES:  None.  MEDICATIONS:  Currently are: 1. Xanax 0.25 mg p.o. t.i.d. p.r.n. 2. Symmetrel 100 mg p.o. daily. 3. Norvasc 5 mg p.o. daily. 4. Lipitor 20 mg p.o. daily. 5. Cipro 200 mg IV q.12 hours. 6. Flonase nasal spray daily. 7. Folic acid 2 mg p.o. daily. 8. Vicodin 1 p.o. q.4 hours p.r.n. 9. Atrovent nebulizer 0.5  mg q.6 hours. 10.Xopenex nebulizer 0.63 mg q.6h hours. 11.Metoprolol 5 mg IV q.8 hours. 12.Protonix 20 mg p.o. daily. 13.Desyrel 50 mg p.o. at bedtime p.r.n. 14.Valacyclovir 1000 mg p.o. daily. 15.Valproic acid 250 mg q.12 hours.  SOCIAL HISTORY:  Documented.  PHYSICAL EXAMINATION:  GENERAL:  This is a thin, elderly, white gentleman who appears quite frail. VITAL SIGNS:  Temperature of 99.5, pulse 51, respiratory rate 18, blood pressure 153/69. HEAD AND NECK:  Temporal muscle wasting.  He has no obvious oral lesions.  Oral mucosa is somewhat dry.  He has no obvious adenopathy in the neck. LUNGS:  Clear bilaterally. CARDIAC:  Regular rate and rhythm with no murmurs, rubs, or bruits. ABDOMEN:  Soft.  He has decent bowel sounds. EXTREMITIES:  Muscle atrophy in upper and lower extremities. NEUROLOGICAL:  Left-sided weakness.  He has some apraxia.  There is some slight left facial nerve deficit.  LABORATORY STUDIES:  Sodium of 130, potassium 3.9, BUN 14, creatinine 0.65.  Vitamin D is 35.  White cell count 6.3, hemoglobin 12.6, hematocrit 35.9, and platelet count is  231.  IMPRESSION:  Shawn Chen is an 79 year old gentleman.  He now has neurological deficits from this subdural hematoma.  He has been trying to participate in rehabilitation, but it just is too weak for this.  I just think that Shawn Chen is at a point where we really need to consider focusing on comfort.  I think that hospice would really be incredibly beneficial for them.  His blood counts look really good.  As such, I do not see that his myelodysplasia is much of a problem right now.  He has good renal function.  His calcium is not high.  Again, I do not believe that his myeloma issues should pose any problems in the near future.  Again, I really think that we should focus on his quality of life, as much as possible.  To this end, I believe that hospice home once she is discharged can be of  benefit.  While he is in the hospital, we could think about palliative care may be held for now.  I will send off a prealbumin on him.  I have always found this to be quite beneficial in trying to make a prognostic judgment.  I just feel bad for Shawn Chen.  He is a real nice guy.  He just has had so much going on against him in the past month that I just do not see him really making progress.     Josph Macho, M.D.     PRE/MEDQ  D:  02/18/2013  T:  02/18/2013  Job:  [id number]

## 2013-02-19 MED ORDER — CHLORHEXIDINE GLUCONATE 0.12 % MT SOLN: 15.0000 mL | Freq: Four times a day (QID) | OROMUCOSAL | Status: AC

## 2013-02-19 MED ORDER — VALPROATE SODIUM 500 MG/5ML IV SOLN: 250.0000 mg | Freq: Two times a day (BID) | INTRAVENOUS | Status: AC

## 2013-02-19 NOTE — Consult Note (Signed)
[facility] Liaison: (Late entry from 02/18/2013 1500) Received request from Dr. Ladona Ridgel to see family for interest in [facility]. Met with patient, spouse and d-i-l Raynelle Fanning at bedside to confirm interest. Noted patient and spouse live in [place]. Raynelle Fanning and spouse report they have mapped and [facility] [facility] is closer and their preference. Answered questions, provided explanation of services, encouraged tour, affirmed family devotion and thoughtfulness with decision making and desire to honor patient's wishes. [facility] room became available for today (02/19/2013) after our meeting ended. Spouse requested to be informed of availability this am, which I will do. Spouse plans to arrive between 9 and 10. We will need to complete paper work at that time. Will contact unit CSW at 8:30. Message sent to Dr. Ladona Ridgel. Will need DC summary faxed to [phone] and RN to call report to [phone]. Thank you. Forrestine Him LCSW [phone]

## 2013-02-19 NOTE — Progress Notes (Signed)
[place] Keenya Matera, LCSW Social Worker Signed  Patient Care Conference Service date: 02/19/2013 2:09 PM  Inpatient RehabilitationTeam Conference and Plan of Care Update Date: 02/17/2013   Time: 3:00 PM     Patient Name: Shawn Chen       Medical Record Number: [id number]   Date of Birth: 05/09/1927 Sex: Male         Room/Bed: 4W13C/4W13C-01 Payor Info: Payor: [employer] [place] MEDICARE / Plan: BLUE MEDICARE / Product Type: *No Product type* /   Admitting Diagnosis: SDH WITH CRANI   Admit Date/Time:  02/11/2013  4:06 PM Admission Comments: No comment available   Primary Diagnosis:  SDH (subdural hematoma) Principal Problem: SDH (subdural hematoma)    Patient Active Problem List     Diagnosis  Date Noted   .  Encephalopathy acute  02/14/2013   .  Seizure  02/14/2013   .  Post-ictal state  02/14/2013   .  SDH (subdural hematoma)  02/11/2013   .  Anemia, unspecified  02/03/2013   .  Dehydration  01/22/2013   .  Generalized weakness  01/22/2013   .  Unspecified constipation  01/22/2013   .  Protein-calorie malnutrition, severe  01/22/2013   .  Fecal impaction  01/16/2013   .  Abdominal pain, unspecified site  01/16/2013   .  Nausea with vomiting  01/16/2013   .  Multiple myeloma, without mention of having achieved remission(203.00)  01/05/2013   .  GERD (gastroesophageal reflux disease)  07/25/2011   .  Diverticulosis of colon (without mention of hemorrhage)  07/25/2011   .  Hernia, hiatal  07/02/2011   .  Candida infection, esophageal  07/02/2011   .  Duodenal diverticulum  07/02/2011   .  Personal history of colonic polyps  06/26/2011   .  Esophageal reflux  06/26/2011   .  RA (rheumatoid arthritis)  06/26/2011     Expected Discharge Date: Expected Discharge Date:  (TBD)  Team Members Present: Physician leading conference: Dr. Faith Rogue Social Worker Present: Amada Jupiter, LCSW Nurse Present: Carmie End, RN PT Present: Zerita Boers, PT OT Present:  Donzetta Kohut, OT;Colby Reels Marlis Edelson, OT SLP Present: Feliberto Gottron, SLP PPS Coordinator present : Tora Duck, RN, CRRN;Becky Henrene Dodge, PT        Current Status/Progress  Goal  Weekly Team Focus   Medical     slow to little improvement, cannot tolerate therapies. not eating. sob at times, heart rate has been variable  stabilize medically for next venue of care. determine  determine appropriate care plan   Bowel/Bladder     Incontinent of Bowel and bladder/condom catheter at HS/ LBM 02/16/13  continent of bowel and bladder with time toileting  continue to monitor   Swallow/Nutrition/ Hydration     NPO  Supervision with least restrictive diet  Readiness for PO trials   ADL's     total assist +1-2  min-mod A  functional transfers, cognition, static sitting balance, forward weight shif   Mobility            Communication     Max-Total A  Min A  arousal   Safety/Cognition/ Behavioral Observations    Total A  Min A  attention, initiation    Pain     no complaints of pain       Skin     Rt crani site with staples  no new skin breakdown  cont to assess q shift  Rehab Goals Patient on target to meet rehab goals: Yes *See Care Plan and progress notes for long and short-term goals.    Barriers to Discharge:  multiple medical issues, failure to progress      Possible Resolutions to Barriers:    see above      Discharge Planning/Teaching Needs:    Pt's wife wants to take pt home after his rehabilitation stay.  Pt may require more care than pt's wife can provide, so CSW will continue to follow and assist with d/c plans.   Pt's family will need to attend Family Education sessions closer to discharge.    Team Discussion:    Pt has multiple medical issues which are affecting his ability to participate, however he is allowing therapists to do things with him.  Dr. [name] to speak with the oncologist and then meet with the wife.  They will need to address feeding issues for  the long term and a possible palliative care consult.  No d/c date set due to pt being medically unstable.   Revisions to Treatment Plan:    Pt's therapy schedule changed to qd as tolerated.      Continued Need for Acute Rehabilitation Level of Care: The patient requires daily medical management by a physician with specialized training in physical medicine and rehabilitation for the following conditions: Daily direction of a multidisciplinary physical rehabilitation program to ensure safe treatment while eliciting the highest outcome that is of practical value to the patient.: Yes Daily medical management of patient stability for increased activity during participation in an intensive rehabilitation regime.: Yes Daily analysis of laboratory values and/or radiology reports with any subsequent need for medication adjustment of medical intervention for : Other;Pulmonary problems;Neurological problems;Cardiac problems  Ghina Bittinger, [place]ck 02/19/2013, 2:09 PM

## 2013-02-19 NOTE — Progress Notes (Signed)
PCP Courtesy Visit: Resting comfortably this am.  Discussed Palliative Care visit yesterday with Dr. Ladona Ridgel.  Agree with family decision to pursue comfort end-of-life care.  Awaiting bed at [facility].  I will follow peripherally and am available if needed.

## 2013-02-19 NOTE — Patient Care Conference (Signed)
Inpatient RehabilitationTeam Conference and Plan of Care Update Date: 02/17/2013   Time: 3:00 PM    Patient Name: Shawn Chen      Medical Record Number: [id number]  Date of Birth: Jun 11, 1927 Sex: Male         Room/Bed: 4W13C/4W13C-01 Payor Info: Payor: [employer] [place] MEDICARE / Plan: BLUE MEDICARE / Product Type: *No Product type* /    Admitting Diagnosis: SDH WITH CRANI  Admit Date/Time:  02/11/2013  4:06 PM Admission Comments: No comment available   Primary Diagnosis:  SDH (subdural hematoma) Principal Problem: SDH (subdural hematoma)  Patient Active Problem List   Diagnosis Date Noted  . Encephalopathy acute 02/14/2013  . Seizure 02/14/2013  . Post-ictal state 02/14/2013  . SDH (subdural hematoma) 02/11/2013  . Anemia, unspecified 02/03/2013  . Dehydration 01/22/2013  . Generalized weakness 01/22/2013  . Unspecified constipation 01/22/2013  . Protein-calorie malnutrition, severe 01/22/2013  . Fecal impaction 01/16/2013  . Abdominal pain, unspecified site 01/16/2013  . Nausea with vomiting 01/16/2013  . Multiple myeloma, without mention of having achieved remission(203.00) 01/05/2013  . GERD (gastroesophageal reflux disease) 07/25/2011  . Diverticulosis of colon (without mention of hemorrhage) 07/25/2011  . Hernia, hiatal 07/02/2011  . Candida infection, esophageal 07/02/2011  . Duodenal diverticulum 07/02/2011  . Personal history of colonic polyps 06/26/2011  . Esophageal reflux 06/26/2011  . RA (rheumatoid arthritis) 06/26/2011    Expected Discharge Date: Expected Discharge Date:  (TBD)  Team Members Present: Physician leading conference: Dr. Faith Rogue Social Worker Present: Amada Jupiter, LCSW Nurse Present: Carmie End, RN PT Present: Zerita Boers, PT OT Present: Donzetta Kohut, OT;Maat Kafer Marlis Edelson, OT SLP Present: Feliberto Gottron, SLP PPS Coordinator present : Tora Duck, RN, CRRN;Becky Henrene Dodge, PT     Current  Status/Progress Goal Weekly Team Focus  Medical   slow to little improvement, cannot tolerate therapies. not eating. sob at times, heart rate has been variable  stabilize medically for next venue of care. determine  determine appropriate care plan   Bowel/Bladder   Incontinent of Bowel and bladder/condom catheter at HS/ LBM 02/16/13  continent of bowel and bladder with time toileting  continue to monitor   Swallow/Nutrition/ Hydration   NPO  Supervision with least restrictive diet  Readiness for PO trials   ADL's   total assist +1-2  min-mod A  functional transfers, cognition, static sitting balance, forward weight shif   Mobility             Communication   Max-Total A  Min A  arousal   Safety/Cognition/ Behavioral Observations  Total A  Min A  attention, initiation    Pain   no complaints of pain         Skin   Rt crani site with staples  no new skin breakdown  cont to assess q shift    Rehab Goals Patient on target to meet rehab goals: Yes *See Care Plan and progress notes for long and short-term goals.  Barriers to Discharge: multiple medical issues, failure to progress    Possible Resolutions to Barriers:  see above    Discharge Planning/Teaching Needs:  Pt's wife wants to take pt home after his rehabilitation stay.  Pt may require more care than pt's wife can provide, so CSW will continue to follow and assist with d/c plans.  Pt's family will need to attend Family Education sessions closer to discharge.   Team Discussion:  Pt has multiple medical issues which are affecting  his ability to participate, however he is allowing therapists to do things with him.  Dr. [name] to speak with the oncologist and then meet with the wife.  They will need to address feeding issues for the long term and a possible palliative care consult.  No d/c date set due to pt being medically unstable.  Revisions to Treatment Plan:  Pt's therapy schedule changed to qd as tolerated.      Continued Need for Acute Rehabilitation Level of Care: The patient requires daily medical management by a physician with specialized training in physical medicine and rehabilitation for the following conditions: Daily direction of a multidisciplinary physical rehabilitation program to ensure safe treatment while eliciting the highest outcome that is of practical value to the patient.: Yes Daily medical management of patient stability for increased activity during participation in an intensive rehabilitation regime.: Yes Daily analysis of laboratory values and/or radiology reports with any subsequent need for medication adjustment of medical intervention for : Other;Pulmonary problems;Neurological problems;Cardiac problems  Vivika Poythress, [place] 02/19/2013, 2:09 PM

## 2013-02-19 NOTE — Progress Notes (Signed)
Speech Language Pathology Discharge Summary  Patient Details  Name: Shawn Chen MRN: [id number] Date of Birth: 1927/04/01  Today's Date: 02/19/2013  Patient has met  0 of 5 long term goals.  Patient to discharge at overall Total level.   Reasons goals not met: All goals N/A due to patient's family decision to stop all interventional treatments and pursue comfort care.   Clinical Impression/Discharge Summary: Patient has met 0 of 5 long term goals at this time due to family's decision to discontinue rehabilitative treatment and pursue comfort care. Patient to discharge at overall Total Assist for PO intake with Dys. 1 textures and nectar-thick liquids and for cognitive function with basic and familiar tasks. Patient's care partner is unable to provide the necessary physical and cognitive assistance at discharge and family's plan is to discharge to a hospice facility.   Care Partner:  Caregiver Able to Provide Assistance: No  Type of Caregiver Assistance: Physical;Cognitive  Recommendation:  None      Equipment: N/A   Reasons for discharge: Discharged from hospital   Patient/Family Agrees with Progress Made and Goals Achieved: Yes   See FIM for current functional status  [name] 02/19/2013, 7:50 AM

## 2013-02-19 NOTE — Progress Notes (Signed)
Report called to Marisue Ivan at [facility] [employer], full report given. Pt transported via [facility] to [facility], belongings packed and sent, wife followed ambulance to [facility]

## 2013-02-19 NOTE — Progress Notes (Signed)
Subjective/Complaints: No problems overnight.  A 12 point review of systems has been performed and if not noted above is otherwise negative.   Objective: Vital Signs: Blood pressure 176/100, pulse 96, temperature 96.8 F (36 C), temperature source Oral, resp. rate 17, height 5\' 10"  (1.778 m), weight 60.9 kg (134 lb 4.2 oz), SpO2 96.00%. No results found. No results found for this basename: WBC, HGB, HCT, PLT,  in the last 72 hours No results found for this basename: NA, K, CL, CO, GLUCOSE, BUN, CREATININE, CALCIUM,  in the last 72 hours CBG (last 3)  No results found for this basename: GLUCAP,  in the last 72 hours  Wt Readings from Last 3 Encounters:  02/18/13 60.9 kg (134 lb 4.2 oz)  02/08/13 65.5 kg (144 lb 6.4 oz)  02/08/13 65.5 kg (144 lb 6.4 oz)    Physical Exam:  Constitutional: He appears well-developed and well-nourished. No distress.   HENT:  Head: Normocephalic.  Right crani incision clean Eyes: Conjunctivae and EOM are normal. Pupils are equal, round, and reactive to light. Right eye exhibits no discharge. Left eye exhibits no discharge. No scleral icterus.  Neck: Normal range of motion. Neck supple. No JVD present. No tracheal deviation present. No thyromegaly present.  Cardiovascular: Normal rate and regular rhythm. Exam reveals no gallop and no friction rub.  No murmur heard.  Pulmonary/Chest: Effort normal and breath sounds normal--NO respiratory distress. He has no wheezes. He has no rales. He exhibits no tenderness.  Abdominal: Soft. Bowel sounds are normal. He exhibits no distension and no mass. There is no tenderness. There is no rebound and no guarding.  Musculoskeletal: He exhibits no edema and no tenderness.  Lymphadenopathy:  He has no cervical adenopathy.  Neurological: He is alert. He exhibits normal muscle tone.  more alert.  He displays a left central 7 and slurring of his speech, unchanged.  Some apraxia on left was  seen. LUE 2+ to 3- at deltoid,  tricep, biceps, 2 to 2+with HI, wrist. Senses pain in left arm and leg. LLE is 3 to 3+ with HF, KE, ADF and APF. Right upper extremity and lower grossly 4/5.    Skin: Skin is warm and dry. He is not diaphoretic.    Assessment/Plan: 1. Functional deficits secondary to right fronto-parietal SDH which require 3+ hours per day of interdisciplinary therapy in a comprehensive inpatient rehab setting. Physiatrist is providing close team supervision and 24 hour management of active medical problems listed below. Physiatrist and rehab team continue to assess barriers to discharge/monitor patient progress toward functional and medical goals.  Appreciate efforts of all involved. Pt to be assessed today for residential hospice. Comfort care only at present. Pt appears comfortable at present. Reviewed care with RN in pt's room.    FIM: FIM - Bathing Bathing: 1: Total-Patient completes 0-2 of 10 parts or less than 25%  FIM - Upper Body Dressing/Undressing Upper body dressing/undressing: 1: Total-Patient completed less than 25% of tasks FIM - Lower Body Dressing/Undressing Lower body dressing/undressing: 1: Two helpers  FIM - Toileting Toileting steps completed by patient: Adjust clothing prior to toileting;Performs perineal hygiene;Adjust clothing after toileting Toileting: 1: Total-Patient completed zero steps, helper did all 3  FIM - Diplomatic Services operational officer Devices: Psychiatrist Transfers: 0-Activity did not occur  FIM - Banker Devices: Arm rests Bed/Chair Transfer: 1: Two helpers  FIM - Locomotion: Wheelchair Locomotion: Wheelchair: 0: Activity did not occur FIM - Locomotion: Ambulation Locomotion:  Ambulation: 0: Activity did not occur  Comprehension Comprehension Mode: Auditory Comprehension: 3-Understands basic 50 - 74% of the time/requires cueing 25 - 50%  of the time  Expression Expression Mode:  Verbal Expression: 2-Expresses basic 25 - 49% of the time/requires cueing 50 - 75% of the time. Uses single words/gestures.  Social Interaction Social Interaction: 2-Interacts appropriately 25 - 49% of time - Needs frequent redirection.  Problem Solving Problem Solving: 1-Solves basic less than 25% of the time - needs direction nearly all the time or does not effectively solve problems and may need a restraint for safety  Memory Memory: 2-Recognizes or recalls 25 - 49% of the time/requires cueing 51 - 75% of the time  Medical Problem List and Plan:  1. DVT Prophylaxis/Anticoagulation: Mechanical: Sequential compression devices, below knee Bilateral lower extremities  2. Chronic back pain/Pain Management: Uses aleve and "salve" at home.  3. Mood:Continue celexa. LCSW to follow for evaluation.  4. Neuropsych: This patient is intermittently capable of making decisions on his own behalf.  6. Multiple Myeloma: recent diagnosis. Chemo on hold currently.  -may be worthwhile to have onc way in on MM/effects upon appearance   7. GERD: Continue protonix.   8. HTN: Will monitor with bid checks. Continue norvasc and metoprolol.  9. H/o severe constipation:  bowel program   10 Hyponatremia: continues around 130 13. Dysphagia-  -continue NPO  LOS (Days) 8 A FACE TO FACE EVALUATION WAS PERFORMED  Shawn Chen T 02/19/2013 7:47 AM

## 2013-02-20 NOTE — Progress Notes (Signed)
Social Work Patient ID: Shawn Chen, male   DOB: [birth year], 79 y.o.   MRN: [id number]  CSW met with pt/pt's wife 02-18-13 to update her on team conference discussion.  Pt's wife had already met with Dr. [name], Dr. Myna Hidalgo, and spoke with Dr. [name] via telephone by the time of our meeting.  Pt's wife explained to CSW what she understood of pt's condition, which included that pt has multiple medical issues and is not doing well.  She also understood that therapies may be too much for pt at this time.  Pt's wife was agreeable to Palliative Care consult, so she is awaiting their arrival at the time of our visit.  She wants team to do whatever they can for pt, but understands that palliative/hospice care may be the best thing for pt and this leaves her a bit conflicted.  Pt's wife is aware of concerns re: pt's feeding and that we will continue to try as he can tolerated ice chips, food, etc.   CSW then followed up with pt the morning of 02-19-13 and learned that pt/pt's wife had met with Dr. Ladona Ridgel from Palliative Care and Forrestine Him from [facility] and [facility] [place] later on 02-18-13.  Pt's wife is agreeable to transfer to [facility] when bed is available.  CSW heard from Carley Hammed that bed would be available on 02-19-13.  Pt's wife was pleased with that information and agrees with the hospice plan of care, however she has a friend who questioned whether this was the right course of action.  CSW and Carley Hammed spent some time educating pt's wife on hospice and the nurses at [facility], explaining that they will continue to provide care and assess him and if they see a change in pt's condition, they will re-evaluate the appropriateness of hospice care.  Described the environment of [facility] to pt's wife and she was then able to describe it to the pt, telling him it was more "home-like".  Pt's wife was more comfortable with the plan and spent some time sharing stories about pt and about their life  together.  CSW spent time with pt's wife and pt as she was trying to comfort him.  Both were tearful, but pt's wife was trying to let pt know that she would be fine and that it was okay for him to "go" if he needed to.  She told him not to worry about her.  Pt was anxious, but was able to be calmed with wife present and PA/RN adjusting pt in the bed to be more comfortable.  CSW called PTAR to transport pt to [facility] and [place], RN called report to Charity fundraiser at [facility].  Pt's wife went ahead of PTAR so that she would be there when he arrived.  Pt was very calm on the stretcher as he left the unit.

## 2013-02-20 NOTE — Progress Notes (Addendum)
Social Work Discharge Note  The overall goal for the admission was met for:   Discharge location: No - original plan for home.  Medical decline and tx to [facility].  Length of Stay: No - 8 days  Discharge activity level: No - remained total assist  Home/community participation: No  Services provided included: MD, RD, PT, OT, SLP, RN, Pharmacy and SW  Financial Services:  Blue Medicare from [place]  Follow-up services arranged: Other: tx to [employer] [facility] (or additional information):  Medical decline impacted rehab goals  Patient/Family verbalized understanding of follow-up arrangements: Yes  Individual responsible for coordination of the follow-up plan: Pt's wife  Confirmed correct DME delivered: N/A due to [facility] tx   Shawn Chen, [employer]

## 2013-02-23 ENCOUNTER — Other Ambulatory Visit: Payer: Medicare Other | Admitting: Lab

## 2013-02-23 ENCOUNTER — Ambulatory Visit: Payer: Medicare Other

## 2013-02-23 DIAGNOSIS — R627 Adult failure to thrive: Secondary | ICD-10-CM | POA: Insufficient documentation

## 2013-02-23 DIAGNOSIS — D630 Anemia in neoplastic disease: Secondary | ICD-10-CM | POA: Insufficient documentation

## 2013-02-23 NOTE — Progress Notes (Signed)
Patient ID: Shawn Chen, male   DOB: February 19, 1927, 79 y.o.   MRN: [id number]        HISTORY & PHYSICAL  DATE: 01/27/2013   FACILITY: [facility]  LEVEL OF CARE: SNF (31)  ALLERGIES:  No Known Allergies  CHIEF COMPLAINT:  Manage failure to thrive, multiple myeloma, and rheumatoid arthritis.    HISTORY OF PRESENT ILLNESS:  The patient is an 79 year-old, Caucasian male who was hospitalized for increasing weakness.  After hospitalization, he is admitted to this facility for short-term rehabilitation.     MULTIPLE MYELOMA:  The patient was diagnosed in July 2014.  Bone marrow biopsy showed hypercellular marrow with 17% plasma cells.  The patient was initiated on treatment.  He denies any acute problems.     FAILURE TO THRIVE:   The patient is  currently on feeding supplements and Megestrol.  He is tolerating them without any problems.    RHEUMATOID ARTHRITIS: Patient's arthritis remains stable.  The patient denies ongoing joint pains, stiffness, swelling, warmth & redness.  No complications reported from the medication(s) currently being used.    PAST MEDICAL HISTORY :  Past Medical History  Diagnosis Date  . Diverticulosis of colon (without mention of hemorrhage)   . Esophagitis, unspecified   . 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 Personal history of colonic polyps   . Reflux   . Hernia   . RA (rheumatoid arthritis)   . Myeloma   . Multiple myeloma, without mention of having achieved remission(203.00) 01/05/2013  . Subdural hematoma 6/14    secondary to fall    PAST SURGICAL HISTORY: Past Surgical History  Procedure Laterality Date  . Inguinal hernia repair  03/28/1990    dr price  . Orif hip fracture    . Craniotomy Right 02/08/2013    Procedure: Right CRANIOTOMY for HEMATOMA EVACUATION SUBDURAL;  Surgeon: Cristi Loron, MD;  Location: [facility];  Service: Neurosurgery;  Laterality: Right;    SOCIAL HISTORY:  reports that  he quit smoking about 22 years ago. He has never used smokeless tobacco. He reports that he does not drink alcohol or use illicit drugs.  FAMILY HISTORY:  Family History  Problem Relation Age of Onset  . Colon cancer Neg Hx     CURRENT MEDICATIONS: Reviewed per [facility]  REVIEW OF SYSTEMS:  See HPI otherwise 14 point ROS is negative.  PHYSICAL EXAMINATION  VS:  T 98       P 68      RR 20      BP       POX%        WT (Lb)  GENERAL: no acute distress, normal body habitus SKIN: warm & dry, no suspicious lesions or rashes, no excessive dryness EYES: conjunctivae normal, sclerae normal, normal eye lids MOUTH/THROAT: lips without lesions,no lesions in the mouth,tongue is without lesions,uvula elevates in midline NECK: supple, trachea midline, no neck masses, no thyroid tenderness, no thyromegaly LYMPHATICS: no LAN in the neck, no supraclavicular LAN RESPIRATORY: breathing is even & unlabored, BS CTAB CARDIAC: RRR, no murmur,no extra heart sounds, no edema GI:  ABDOMEN: abdomen soft, normal BS, no masses, no tenderness  LIVER/SPLEEN: no hepatomegaly, no splenomegaly MUSCULOSKELETAL: HEAD: normal to inspection & palpation BACK: no kyphosis, scoliosis or spinal processes tenderness EXTREMITIES: LEFT UPPER EXTREMITY: full range of motion, normal strength & tone RIGHT UPPER EXTREMITY:  full range of motion, normal strength & tone LEFT LOWER EXTREMITY:  full range of motion, normal strength & tone RIGHT LOWER EXTREMITY:  full range of motion, normal strength & tone PSYCHIATRIC: the patient is alert & oriented to person, affect & behavior appropriate  LABS/RADIOLOGY:  Glucose 106, potassium 3.2, otherwise BMP normal.    Albumin 1.9, total protein 5.7, otherwise liver profile normal.    Hemoglobin 9.8, MCV 93.2, platelets 143, WBC 4.6.    INR 1.25.    KUB did not show obstruction.  It showed moderate stool burden.     Abdominal CT showed probable rectal and sigmoid colon fecal impaction.   No acute findings.  No cholecystitis.  Stable large hiatal hernia.    Acute abdominal series did not show any acute findings.    CT of the chest did not show any acute findings.    ASSESSMENT/PLAN:  Multiple myeloma.  Continue current medications.  Management per hematologist.    Failure to thrive.  Continue Megestrol and dietary supplements.    Rheumatoid arthritis.  Denies ongoing symptoms.    Anemia of neoplasm.  Status post transfusion.  Reassess hemoglobin level.    Hypertension.  Request blood pressure charting.  Continue current medications.    GERD.  Well controlled.     Constipation.  Well controlled.    BPH.  Continue Flomax.    Check CBC and BMP.    I have reviewed patient's medical records received at admission/from hospitalization.  CPT CODE: 16109

## 2013-03-02 ENCOUNTER — Other Ambulatory Visit: Payer: Medicare Other | Admitting: Lab

## 2013-03-02 ENCOUNTER — Ambulatory Visit: Payer: Medicare Other

## 2013-03-16 ENCOUNTER — Other Ambulatory Visit: Payer: Medicare Other | Admitting: Lab

## 2013-03-16 ENCOUNTER — Ambulatory Visit: Payer: Medicare Other

## 2013-03-16 ENCOUNTER — Ambulatory Visit: Payer: Medicare Other | Admitting: Hematology & Oncology

## 2013-03-16 NOTE — Progress Notes (Signed)
This encounter was created in error - please disregard.

## 2013-03-18 DEATH — deceased

## 2013-03-23 ENCOUNTER — Other Ambulatory Visit: Payer: Medicare Other | Admitting: Lab

## 2013-03-23 ENCOUNTER — Ambulatory Visit: Payer: Medicare Other

## 2013-10-21 IMAGING — CR DG CHEST 2V
2 series · 2 of 2 positions shown · non-contrast
Comparison: One-view chest x-ray 01/22/2013, 01/16/2013,
11/24/2012, 01/17/2010.

CLINICAL DATA: Generalized weakness.  Admission.  Current history
multiple myeloma.

CHEST - 2 VIEW

[w chest lat]
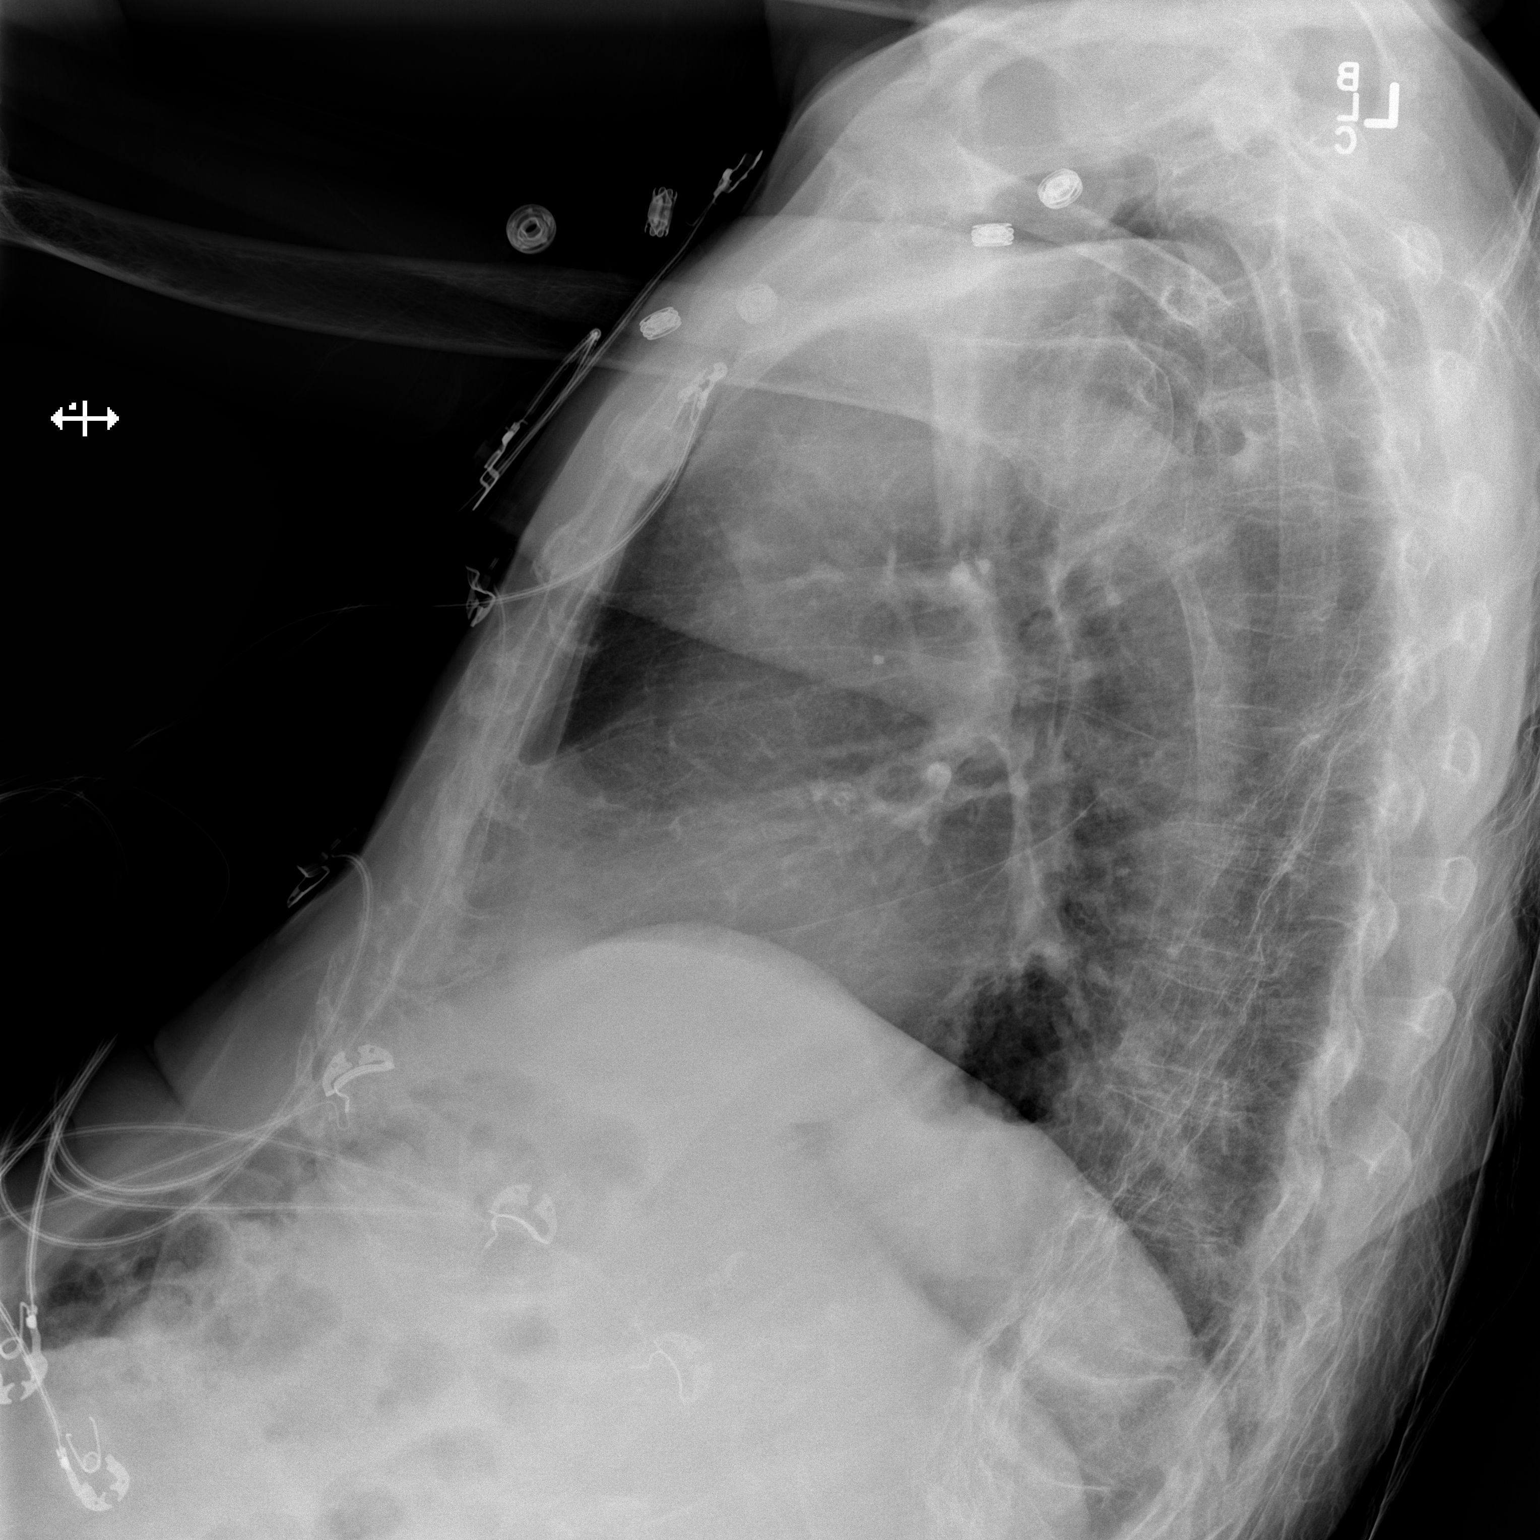

[x chest ap]
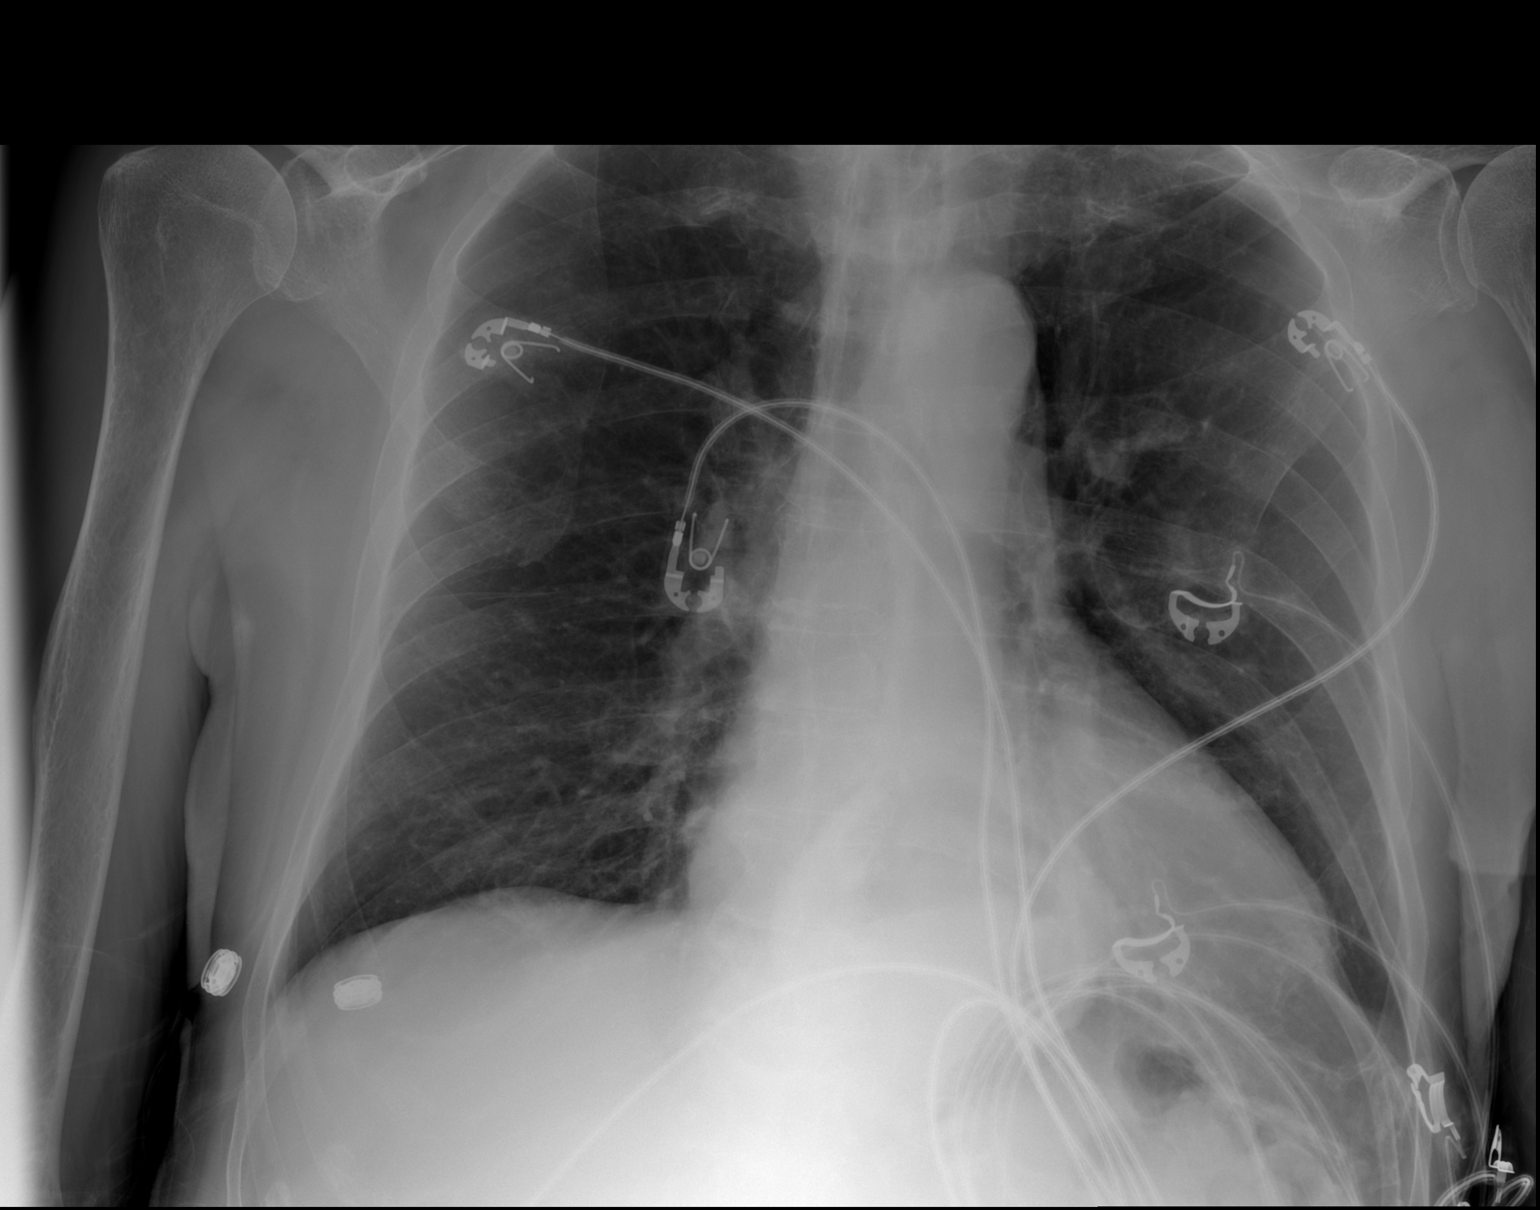

[2 of 2 positions shown; findings below may reference images not displayed]

FINDINGS: Cardiac silhouette moderately enlarged but stable.  Small
to moderate sized hiatal hernia, unchanged.  Hilar and mediastinal
contours otherwise unremarkable.  Pulmonary vascularity normal.
Emphysematous changes in the upper lobes.  Probable calcified
granuloma or hamartoma in the right mid lung on the PA image,
likely the right middle lobe, unchanged since the November 2012
examination.  No new pulmonary parenchymal abnormalities.  No
pleural effusions.  Degenerative changes involving the thoracic
spine and generalized osseous demineralization.  Old compression
fracture of T12.
IMPRESSION: COPD/emphysema.  No acute cardiopulmonary disease.  Stable
cardiomegaly.  Stable hiatal hernia.  Old compression fracture of
T12.

## 2013-10-25 IMAGING — CT CT HEAD W/O CM
1 series · 15 of 30 positions shown, 19 images · non-contrast
Comparison: 02/09/2013

CLINICAL DATA: Increasing lethargy. status post craniotomy for
subdural hematoma, prior stroke, history of multiple myeloma

CT HEAD WITHOUT CONTRAST
TECHNIQUE: Contiguous axial images were obtained from the base of
the skull through the vertex without contrast.

[Series 2: head 5.0 h30s · axial · 0.42mm/px · z∈[+1082,+1227]mm · 15 of 33 slices shown, 19 images]
[im 2/33  brain]
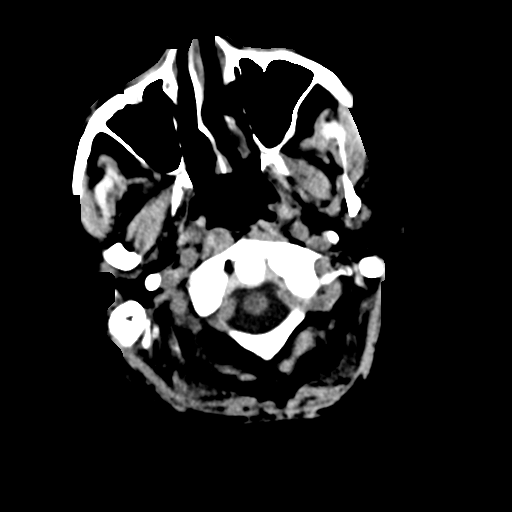
[im 2/33  bone]
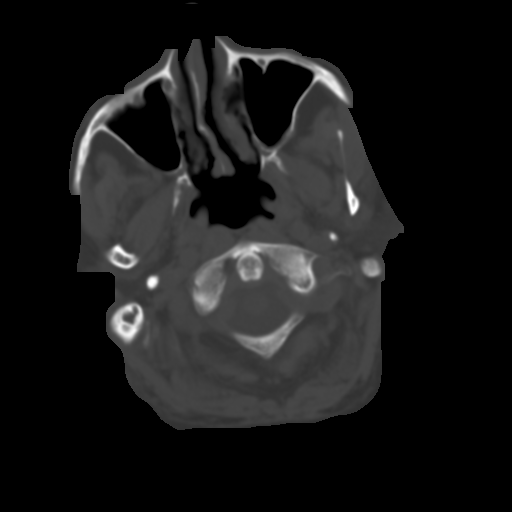
[im 4/33  brain]
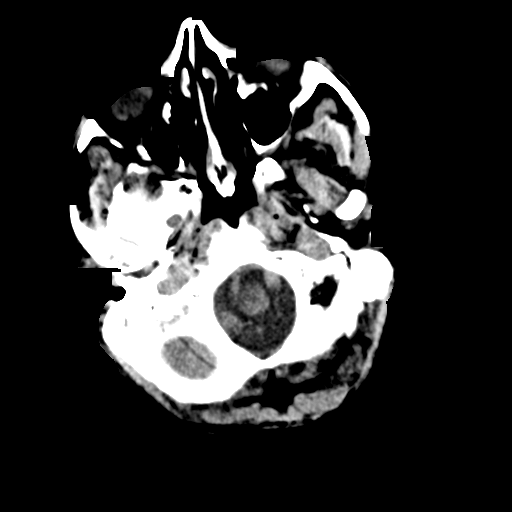
[im 6/33  brain]
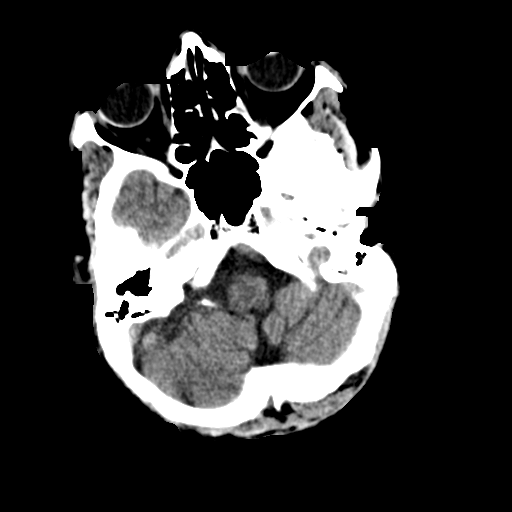
[im 8/33  brain]
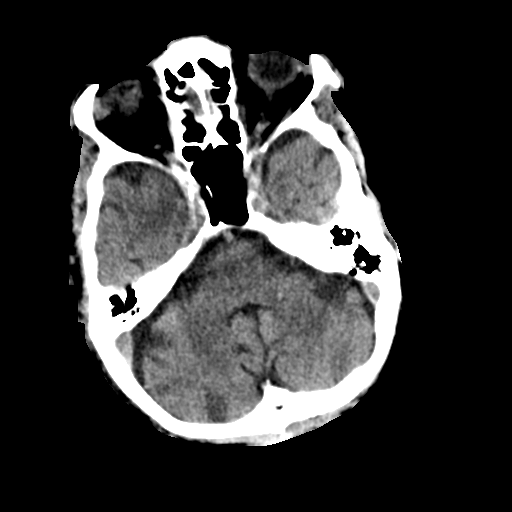
[im 10/33  brain]
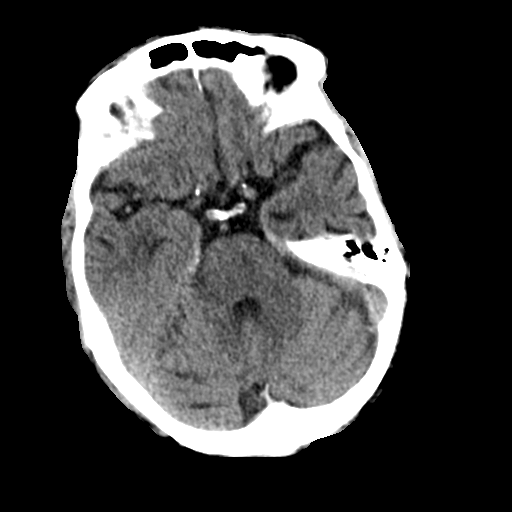
[im 10/33  bone]
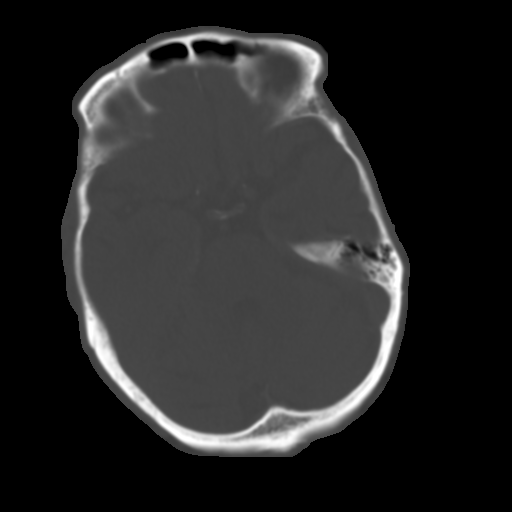
[im 13/33  brain]
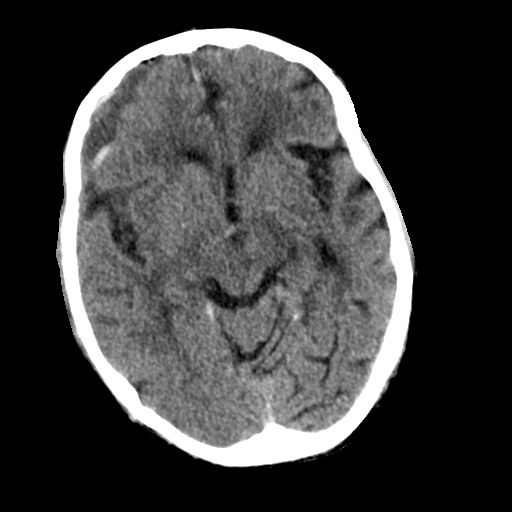
[im 15/33  brain]
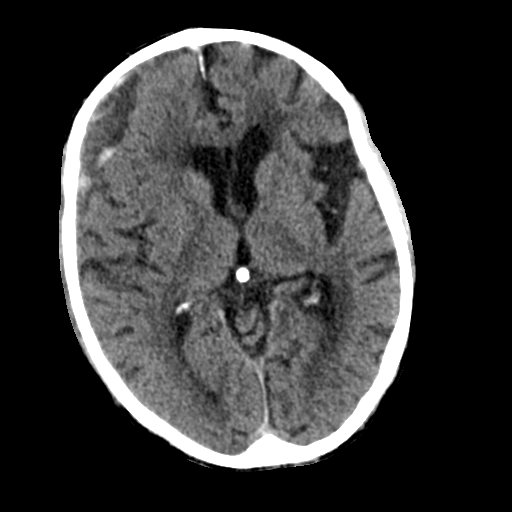
[im 17/33  brain]
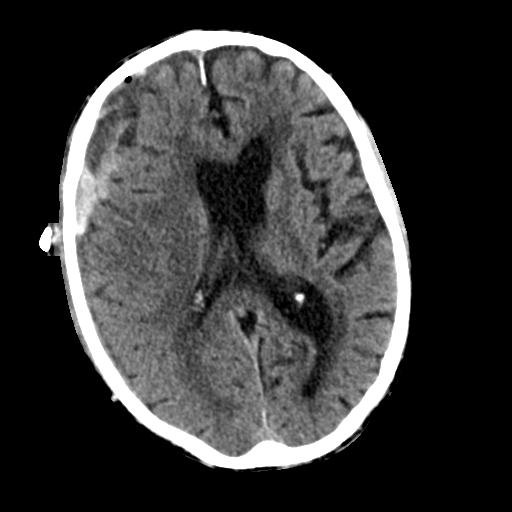
[im 18/33  brain]
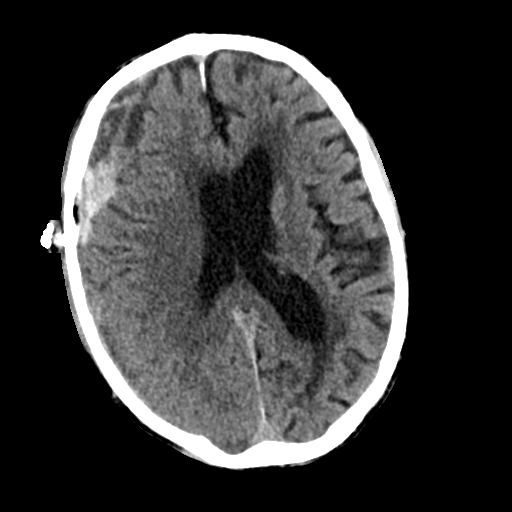
[im 18/33  bone]
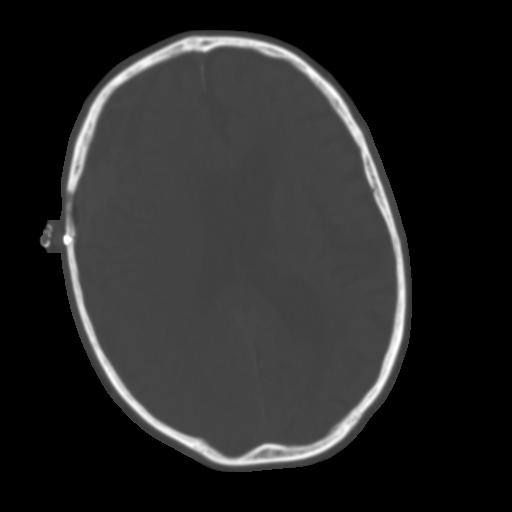
[im 20/33  brain]
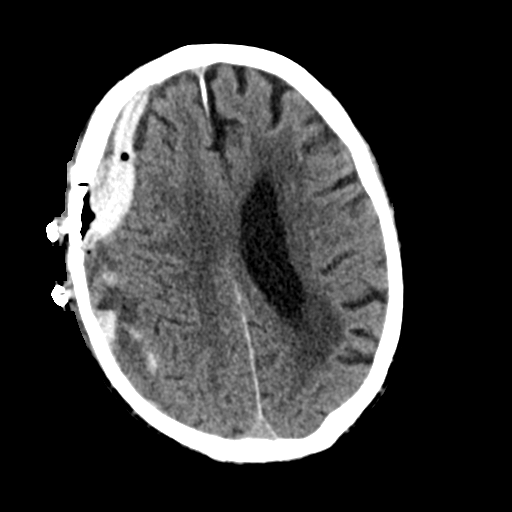
[im 23/33  brain]
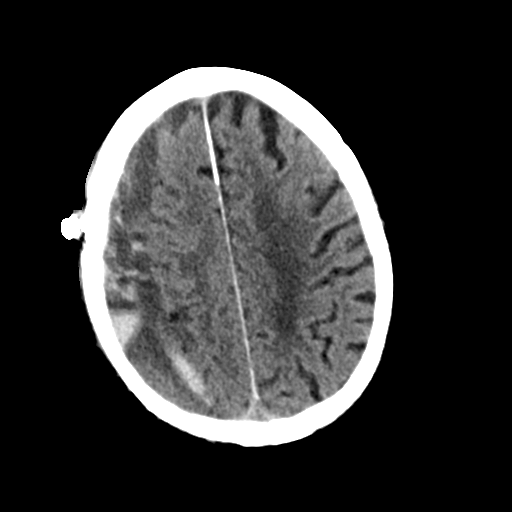
[im 25/33  brain]
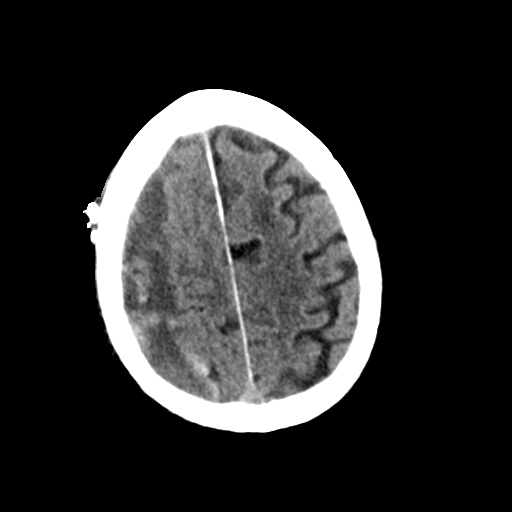
[im 27/33  brain]
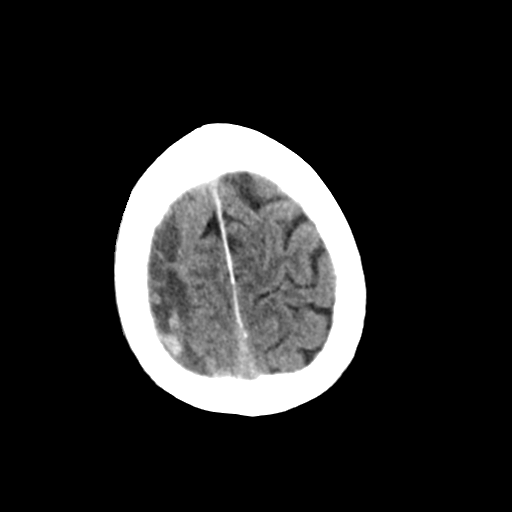
[im 27/33  bone]
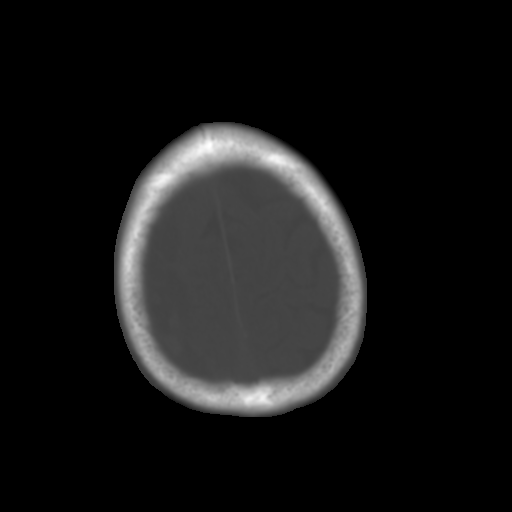
[im 29/33  brain]
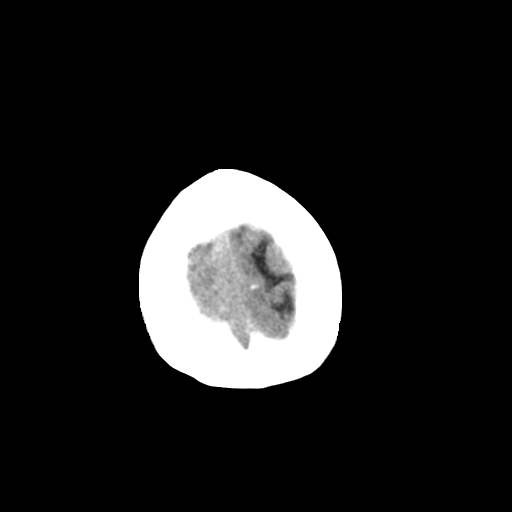
[im 31/33  brain]
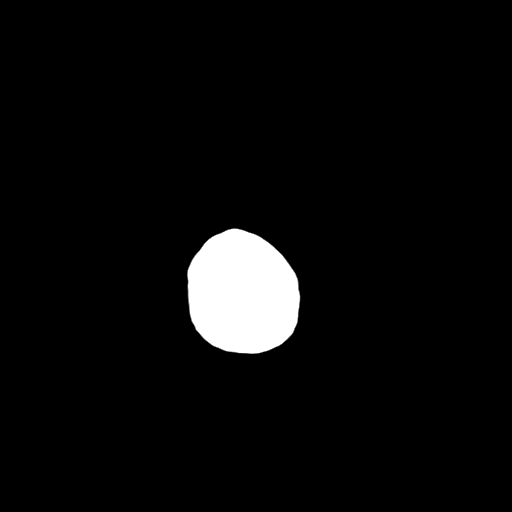

[15 of 30 positions shown; findings below may reference images not displayed]

FINDINGS: Status post right frontal craniotomy.  Overlying skin
staples.  Interval removal of prior subdural drain.

Underlying mixed density subdural hemorrhage measuring up to 1.8 cm
in thickness, previously 1.7 cm.  Associated mild pneumocephalus.

Associated mass effect with 5 mm leftward midline shift, unchanged.
Basal cisterns are patent.

No evidence of parenchymal or interventricular hemorrhage.

Subcortical white matter and periventricular small vessel ischemic
changes.

Global cortical atrophy with stable mild ventricular prominence.

The visualized paranasal sinuses are essentially clear. The mastoid
air cells are unopacified.

No evidence of calvarial fracture.
IMPRESSION: Status post right frontal craniotomy.  Interval removal of prior
subdural drain.

Underlying mixed density right subdural hemorrhage measuring up to
1.8 cm in thickness, previously 1.7 cm.

Associated mass effect with 5 mm leftward midline shift, unchanged.

## 2013-10-28 IMAGING — CR DG CHEST 2V
1 series · 1 of 1 positions shown · non-contrast
Comparison: 02/08/2013

CLINICAL DATA: Shortness of breath.

CHEST - 2 VIEW

[w chest lat]
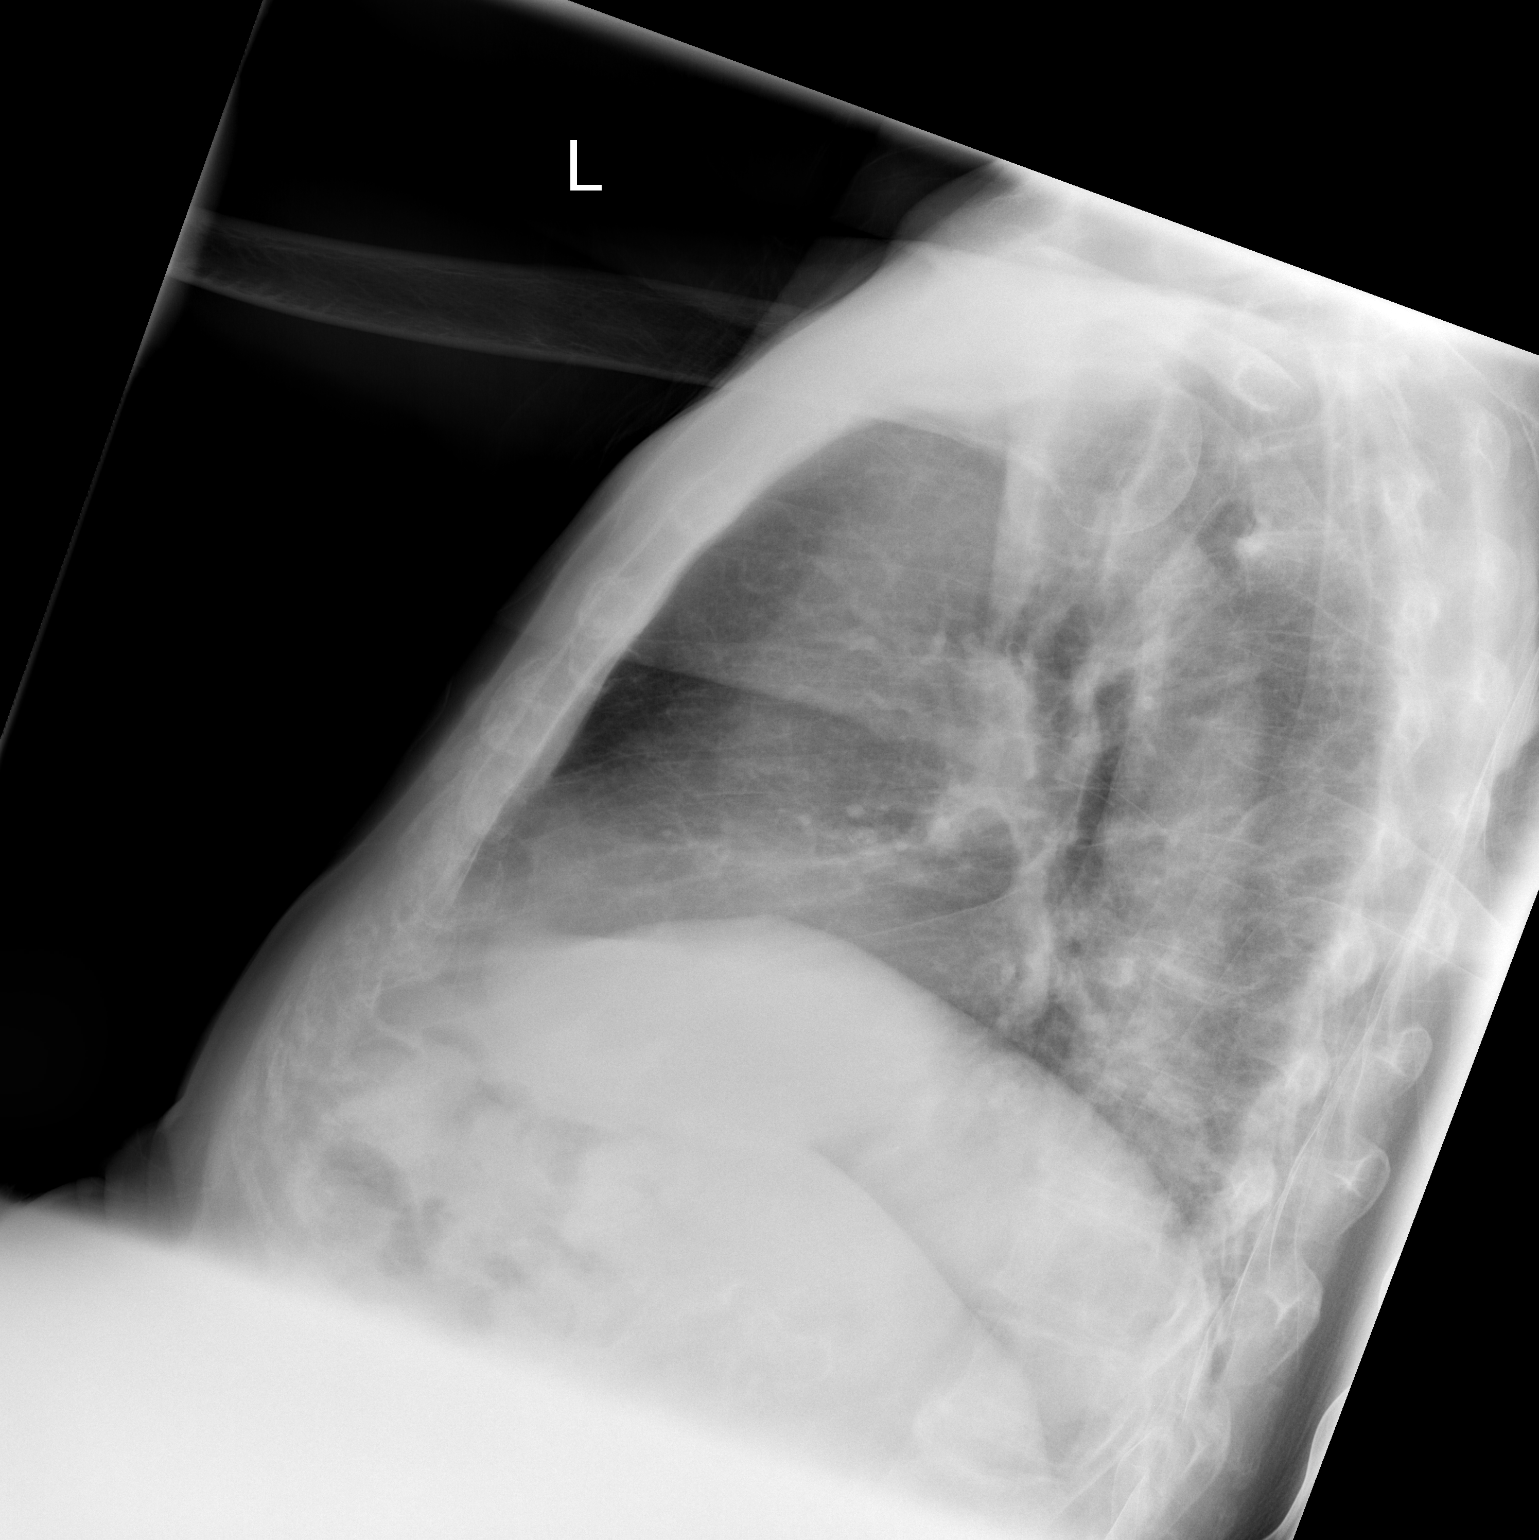

[1 of 1 positions shown; findings below may reference images not displayed]

FINDINGS: There is stable chronic lung disease/COPD.  No evidence
of pulmonary edema, airspace consolidation or visible pleural
fluid.  Stable mild cardiomegaly noted.  There is a stable moderate
sized hiatal hernia.
IMPRESSION: Stable COPD and moderate sized hiatal hernia.  No acute findings.

## 2014-07-26 ENCOUNTER — Other Ambulatory Visit: Payer: Self-pay | Admitting: Family
